# Patient Record
Sex: Male | Born: 1950 | Race: White | Hispanic: No | Marital: Married | Smoking: Former smoker
Health system: Southern US, Community
[De-identification: ages and names within clinical notes are randomized; demographics above are authoritative.]

## PROBLEM LIST (undated history)

## (undated) DIAGNOSIS — K7460 Unspecified cirrhosis of liver: Secondary | ICD-10-CM

## (undated) DIAGNOSIS — I10 Essential (primary) hypertension: Secondary | ICD-10-CM

## (undated) DIAGNOSIS — J449 Chronic obstructive pulmonary disease, unspecified: Secondary | ICD-10-CM

## (undated) DIAGNOSIS — D696 Thrombocytopenia, unspecified: Secondary | ICD-10-CM

## (undated) DIAGNOSIS — E039 Hypothyroidism, unspecified: Secondary | ICD-10-CM

## (undated) DIAGNOSIS — Z66 Do not resuscitate: Secondary | ICD-10-CM

## (undated) DIAGNOSIS — R161 Splenomegaly, not elsewhere classified: Secondary | ICD-10-CM

## (undated) DIAGNOSIS — K766 Portal hypertension: Secondary | ICD-10-CM

## (undated) DIAGNOSIS — Z21 Asymptomatic human immunodeficiency virus [HIV] infection status: Secondary | ICD-10-CM

## (undated) DIAGNOSIS — E7800 Pure hypercholesterolemia, unspecified: Secondary | ICD-10-CM

## (undated) HISTORY — PX: LEG SURGERY: SHX1003

## (undated) HISTORY — DX: Asymptomatic human immunodeficiency virus (hiv) infection status: Z21

## (undated) HISTORY — PX: BACK SURGERY: SHX140

## (undated) HISTORY — PX: TOE AMPUTATION: SHX809

## (undated) HISTORY — PX: THYROID SURGERY: SHX805

## (undated) HISTORY — PX: ROTATOR CUFF REPAIR: SHX139

## (undated) SURGERY — Surgical Case
Anesthesia: *Unknown

## (undated) SURGERY — EGD (ESOPHAGOGASTRODUODENOSCOPY)
Anesthesia: Monitor Anesthesia Care | Laterality: Left

---

## 2001-08-26 ENCOUNTER — Ambulatory Visit (HOSPITAL_COMMUNITY): Admission: RE | Admit: 2001-08-26 | Discharge: 2001-08-26 | Payer: Self-pay | Admitting: Internal Medicine

## 2002-11-12 ENCOUNTER — Inpatient Hospital Stay (HOSPITAL_COMMUNITY): Admission: EM | Admit: 2002-11-12 | Discharge: 2002-11-13 | Payer: Self-pay | Admitting: Emergency Medicine

## 2002-11-12 ENCOUNTER — Encounter: Payer: Self-pay | Admitting: Emergency Medicine

## 2002-11-21 ENCOUNTER — Ambulatory Visit (HOSPITAL_COMMUNITY): Admission: RE | Admit: 2002-11-21 | Discharge: 2002-11-21 | Payer: Self-pay | Admitting: Unknown Physician Specialty

## 2002-11-21 ENCOUNTER — Encounter: Payer: Self-pay | Admitting: Unknown Physician Specialty

## 2002-11-23 ENCOUNTER — Other Ambulatory Visit: Admission: RE | Admit: 2002-11-23 | Discharge: 2002-11-23 | Payer: Self-pay | Admitting: Unknown Physician Specialty

## 2004-12-11 ENCOUNTER — Ambulatory Visit: Payer: Self-pay | Admitting: Cardiology

## 2004-12-19 ENCOUNTER — Ambulatory Visit: Payer: Self-pay | Admitting: *Deleted

## 2004-12-19 ENCOUNTER — Inpatient Hospital Stay (HOSPITAL_BASED_OUTPATIENT_CLINIC_OR_DEPARTMENT_OTHER): Admission: RE | Admit: 2004-12-19 | Discharge: 2004-12-19 | Payer: Self-pay | Admitting: *Deleted

## 2010-09-23 ENCOUNTER — Encounter
Admission: RE | Admit: 2010-09-23 | Discharge: 2010-09-23 | Payer: Self-pay | Source: Home / Self Care | Attending: Sports Medicine | Admitting: Sports Medicine

## 2011-01-12 ENCOUNTER — Inpatient Hospital Stay (HOSPITAL_COMMUNITY)
Admission: EM | Admit: 2011-01-12 | Discharge: 2011-01-22 | DRG: 464 | Disposition: A | Discharge status: Home Health | Payer: Medicare Other | Attending: Orthopedic Surgery | Admitting: Orthopedic Surgery

## 2011-01-12 ENCOUNTER — Emergency Department (HOSPITAL_COMMUNITY): Payer: Medicare Other

## 2011-01-12 DIAGNOSIS — K219 Gastro-esophageal reflux disease without esophagitis: Secondary | ICD-10-CM | POA: Diagnosis present

## 2011-01-12 DIAGNOSIS — M545 Low back pain, unspecified: Secondary | ICD-10-CM | POA: Diagnosis present

## 2011-01-12 DIAGNOSIS — G5730 Lesion of lateral popliteal nerve, unspecified lower limb: Secondary | ICD-10-CM | POA: Diagnosis present

## 2011-01-12 DIAGNOSIS — Z79899 Other long term (current) drug therapy: Secondary | ICD-10-CM

## 2011-01-12 DIAGNOSIS — I10 Essential (primary) hypertension: Secondary | ICD-10-CM | POA: Diagnosis present

## 2011-01-12 DIAGNOSIS — Z87891 Personal history of nicotine dependence: Secondary | ICD-10-CM

## 2011-01-12 DIAGNOSIS — G8929 Other chronic pain: Secondary | ICD-10-CM | POA: Diagnosis present

## 2011-01-12 DIAGNOSIS — E119 Type 2 diabetes mellitus without complications: Secondary | ICD-10-CM | POA: Diagnosis present

## 2011-01-12 DIAGNOSIS — E7800 Pure hypercholesterolemia, unspecified: Secondary | ICD-10-CM | POA: Diagnosis present

## 2011-01-12 DIAGNOSIS — K5900 Constipation, unspecified: Secondary | ICD-10-CM | POA: Diagnosis not present

## 2011-01-12 DIAGNOSIS — D62 Acute posthemorrhagic anemia: Secondary | ICD-10-CM | POA: Diagnosis not present

## 2011-01-12 DIAGNOSIS — Z794 Long term (current) use of insulin: Secondary | ICD-10-CM

## 2011-01-12 DIAGNOSIS — E669 Obesity, unspecified: Secondary | ICD-10-CM | POA: Diagnosis present

## 2011-01-12 DIAGNOSIS — Y92410 Unspecified street and highway as the place of occurrence of the external cause: Secondary | ICD-10-CM

## 2011-01-12 DIAGNOSIS — S82109B Unspecified fracture of upper end of unspecified tibia, initial encounter for open fracture type I or II: Principal | ICD-10-CM | POA: Diagnosis present

## 2011-01-12 DIAGNOSIS — E890 Postprocedural hypothyroidism: Secondary | ICD-10-CM | POA: Diagnosis present

## 2011-01-12 DIAGNOSIS — K279 Peptic ulcer, site unspecified, unspecified as acute or chronic, without hemorrhage or perforation: Secondary | ICD-10-CM | POA: Diagnosis present

## 2011-01-12 DIAGNOSIS — F411 Generalized anxiety disorder: Secondary | ICD-10-CM | POA: Diagnosis present

## 2011-01-12 DIAGNOSIS — I2510 Atherosclerotic heart disease of native coronary artery without angina pectoris: Secondary | ICD-10-CM | POA: Diagnosis present

## 2011-01-12 DIAGNOSIS — D696 Thrombocytopenia, unspecified: Secondary | ICD-10-CM | POA: Diagnosis not present

## 2011-01-12 LAB — CBC
MCH: 31.8 pg (ref 26.0–34.0)
MCV: 87.5 fL (ref 78.0–100.0)
Platelets: 148 10*3/uL — ABNORMAL LOW (ref 150–400)
RBC: 4.47 MIL/uL (ref 4.22–5.81)
WBC: 7.6 10*3/uL (ref 4.0–10.5)

## 2011-01-12 LAB — COMPREHENSIVE METABOLIC PANEL
Alkaline Phosphatase: 111 U/L (ref 39–117)
Calcium: 9.8 mg/dL (ref 8.4–10.5)
Creatinine, Ser: 1.14 mg/dL (ref 0.4–1.5)
GFR calc Af Amer: >60 mL/min (ref >60)
GFR calc non Af Amer: >60 mL/min (ref >60)
Potassium: 4.3 mEq/L (ref 3.5–5.1)
Total Bilirubin: 1 mg/dL (ref 0.3–1.2)
Total Protein: 7 g/dL (ref 6.0–8.3)

## 2011-01-12 LAB — PROTIME-INR
INR: 1.03 (ref 0.00–1.49)
Prothrombin Time: 13.7 seconds (ref 11.6–15.2)

## 2011-01-12 MED ORDER — IOHEXOL 300 MG/ML  SOLN
90.0000 mL | Freq: Once | INTRAMUSCULAR | Status: AC | PRN
  Administered 2011-01-12: 90 mL via INTRAVENOUS

## 2011-01-13 ENCOUNTER — Inpatient Hospital Stay (HOSPITAL_COMMUNITY): Payer: Medicare Other

## 2011-01-13 LAB — BASIC METABOLIC PANEL
BUN: 14 mg/dL (ref 6–23)
Calcium: 8.6 mg/dL (ref 8.4–10.5)
Chloride: 100 mEq/L (ref 96–112)
GFR calc non Af Amer: >60 mL/min (ref >60)
Glucose, Bld: 344 mg/dL — ABNORMAL HIGH (ref 70–99)
Potassium: 4.4 mEq/L (ref 3.5–5.1)
Sodium: 134 mEq/L — ABNORMAL LOW (ref 135–145)

## 2011-01-13 LAB — HEMOGLOBIN A1C
Hgb A1c MFr Bld: 11.6 % — ABNORMAL HIGH (ref <5.7)
Mean Plasma Glucose: 286 mg/dL — ABNORMAL HIGH (ref <117)

## 2011-01-13 LAB — CBC
Hemoglobin: 10.5 g/dL — ABNORMAL LOW (ref 13.0–17.0)
MCV: 88.3 fL (ref 78.0–100.0)
RDW: 12.6 % (ref 11.5–15.5)
WBC: 14.4 10*3/uL — ABNORMAL HIGH (ref 4.0–10.5)

## 2011-01-13 LAB — GLUCOSE, CAPILLARY
Glucose-Capillary: 385 mg/dL — ABNORMAL HIGH (ref 70–99)
Glucose-Capillary: 292 mg/dL — ABNORMAL HIGH (ref 70–99)
Glucose-Capillary: 369 mg/dL — ABNORMAL HIGH (ref 70–99)

## 2011-01-14 ENCOUNTER — Inpatient Hospital Stay (HOSPITAL_COMMUNITY): Payer: Medicare Other

## 2011-01-14 LAB — COMPREHENSIVE METABOLIC PANEL
ALT: 28 U/L (ref 0–53)
AST: 48 U/L — ABNORMAL HIGH (ref 0–37)
Alkaline Phosphatase: 77 U/L (ref 39–117)
CO2: 29 mEq/L (ref 19–32)
Chloride: 100 mEq/L (ref 96–112)
GFR calc Af Amer: >60 mL/min (ref >60)
GFR calc non Af Amer: >60 mL/min (ref >60)
Sodium: 136 mEq/L (ref 135–145)
Total Bilirubin: 0.7 mg/dL (ref 0.3–1.2)

## 2011-01-14 LAB — TSH: TSH: 2.607 u[IU]/mL (ref 0.350–4.500)

## 2011-01-14 LAB — CBC
Hemoglobin: 8.8 g/dL — ABNORMAL LOW (ref 13.0–17.0)
RBC: 2.77 MIL/uL — ABNORMAL LOW (ref 4.22–5.81)

## 2011-01-14 LAB — GLUCOSE, CAPILLARY
Glucose-Capillary: 253 mg/dL — ABNORMAL HIGH (ref 70–99)
Glucose-Capillary: 317 mg/dL — ABNORMAL HIGH (ref 70–99)

## 2011-01-14 LAB — LACTIC ACID, PLASMA: Lactic Acid, Venous: 2 mmol/L (ref 0.5–2.2)

## 2011-01-15 DIAGNOSIS — S82209A Unspecified fracture of shaft of unspecified tibia, initial encounter for closed fracture: Secondary | ICD-10-CM

## 2011-01-15 DIAGNOSIS — X58XXXA Exposure to other specified factors, initial encounter: Secondary | ICD-10-CM

## 2011-01-15 DIAGNOSIS — S82409A Unspecified fracture of shaft of unspecified fibula, initial encounter for closed fracture: Secondary | ICD-10-CM

## 2011-01-15 LAB — GLUCOSE, CAPILLARY
Glucose-Capillary: 173 mg/dL — ABNORMAL HIGH (ref 70–99)
Glucose-Capillary: 143 mg/dL — ABNORMAL HIGH (ref 70–99)

## 2011-01-15 LAB — CBC
MCV: 87 fL (ref 78.0–100.0)
Platelets: 93 10*3/uL — ABNORMAL LOW (ref 150–400)
RBC: 2.54 MIL/uL — ABNORMAL LOW (ref 4.22–5.81)
WBC: 5.7 10*3/uL (ref 4.0–10.5)

## 2011-01-15 LAB — BASIC METABOLIC PANEL
BUN: 16 mg/dL (ref 6–23)
Chloride: 101 mEq/L (ref 96–112)
Potassium: 3.8 mEq/L (ref 3.5–5.1)

## 2011-01-16 LAB — BASIC METABOLIC PANEL
CO2: 29 mEq/L (ref 19–32)
Chloride: 100 mEq/L (ref 96–112)
GFR calc Af Amer: >60 mL/min (ref >60)
Potassium: 4.3 mEq/L (ref 3.5–5.1)
Sodium: 132 mEq/L — ABNORMAL LOW (ref 135–145)

## 2011-01-16 LAB — GLUCOSE, CAPILLARY
Glucose-Capillary: 230 mg/dL — ABNORMAL HIGH (ref 70–99)
Glucose-Capillary: 150 mg/dL — ABNORMAL HIGH (ref 70–99)
Glucose-Capillary: 160 mg/dL — ABNORMAL HIGH (ref 70–99)
Glucose-Capillary: 216 mg/dL — ABNORMAL HIGH (ref 70–99)

## 2011-01-16 LAB — CBC
Hemoglobin: 8.4 g/dL — ABNORMAL LOW (ref 13.0–17.0)
RBC: 2.65 MIL/uL — ABNORMAL LOW (ref 4.22–5.81)
WBC: 6.5 10*3/uL (ref 4.0–10.5)

## 2011-01-17 LAB — GLUCOSE, CAPILLARY
Glucose-Capillary: 108 mg/dL — ABNORMAL HIGH (ref 70–99)
Glucose-Capillary: 205 mg/dL — ABNORMAL HIGH (ref 70–99)
Glucose-Capillary: 155 mg/dL — ABNORMAL HIGH (ref 70–99)
Glucose-Capillary: 115 mg/dL — ABNORMAL HIGH (ref 70–99)

## 2011-01-17 LAB — CBC
Hemoglobin: 8.9 g/dL — ABNORMAL LOW (ref 13.0–17.0)
MCH: 30.5 pg (ref 26.0–34.0)
Platelets: 159 10*3/uL (ref 150–400)
RBC: 2.92 MIL/uL — ABNORMAL LOW (ref 4.22–5.81)
WBC: 7.3 10*3/uL (ref 4.0–10.5)

## 2011-01-17 LAB — BASIC METABOLIC PANEL
CO2: 28 mEq/L (ref 19–32)
Chloride: 99 mEq/L (ref 96–112)
Creatinine, Ser: 1.02 mg/dL (ref 0.4–1.5)
GFR calc Af Amer: >60 mL/min (ref >60)
Sodium: 134 mEq/L — ABNORMAL LOW (ref 135–145)

## 2011-01-18 LAB — GLUCOSE, CAPILLARY
Glucose-Capillary: 147 mg/dL — ABNORMAL HIGH (ref 70–99)
Glucose-Capillary: 92 mg/dL (ref 70–99)

## 2011-01-18 LAB — CBC
HCT: 25.2 % — ABNORMAL LOW (ref 39.0–52.0)
MCHC: 34.9 g/dL (ref 30.0–36.0)
MCV: 90.6 fL (ref 78.0–100.0)
Platelets: 158 10*3/uL (ref 150–400)
RDW: 13.5 % (ref 11.5–15.5)
WBC: 6.9 10*3/uL (ref 4.0–10.5)

## 2011-01-19 LAB — TYPE AND SCREEN: ABO/RH(D): A NEG

## 2011-01-19 LAB — GLUCOSE, CAPILLARY
Glucose-Capillary: 155 mg/dL — ABNORMAL HIGH (ref 70–99)
Glucose-Capillary: 109 mg/dL — ABNORMAL HIGH (ref 70–99)
Glucose-Capillary: 116 mg/dL — ABNORMAL HIGH (ref 70–99)
Glucose-Capillary: 123 mg/dL — ABNORMAL HIGH (ref 70–99)

## 2011-01-20 LAB — GLUCOSE, CAPILLARY
Glucose-Capillary: 190 mg/dL — ABNORMAL HIGH (ref 70–99)
Glucose-Capillary: 148 mg/dL — ABNORMAL HIGH (ref 70–99)
Glucose-Capillary: 200 mg/dL — ABNORMAL HIGH (ref 70–99)
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
## 2011-01-21 LAB — GLUCOSE, CAPILLARY
Glucose-Capillary: 122 mg/dL — ABNORMAL HIGH (ref 70–99)
Glucose-Capillary: 162 mg/dL — ABNORMAL HIGH (ref 70–99)
Glucose-Capillary: 157 mg/dL — ABNORMAL HIGH (ref 70–99)

## 2011-01-22 LAB — BASIC METABOLIC PANEL
BUN: 14 mg/dL (ref 6–23)
Creatinine, Ser: 1.06 mg/dL (ref 0.4–1.5)
GFR calc non Af Amer: >60 mL/min (ref >60)
Glucose, Bld: 113 mg/dL — ABNORMAL HIGH (ref 70–99)

## 2011-01-22 LAB — CBC
HCT: 29.4 % — ABNORMAL LOW (ref 39.0–52.0)
MCH: 30.8 pg (ref 26.0–34.0)
MCHC: 34.4 g/dL (ref 30.0–36.0)
MCV: 89.6 fL (ref 78.0–100.0)
Platelets: 213 10*3/uL (ref 150–400)
RDW: 13.5 % (ref 11.5–15.5)

## 2011-01-22 LAB — GLUCOSE, CAPILLARY
Glucose-Capillary: 119 mg/dL — ABNORMAL HIGH (ref 70–99)
Glucose-Capillary: 127 mg/dL — ABNORMAL HIGH (ref 70–99)
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
## 2011-02-13 NOTE — Cardiovascular Report (Signed)
NAMEROURKE, MCQUITTY                  ACCOUNT NO.:  [id number]   MEDICAL RECORD NO.:  [id number]          PATIENT TYPE:  OIB   LOCATION:  6501                         FACILITY:  [facility]   PHYSICIAN:  Carole Binning, M.D. LHCDATE OF BIRTH:  05-16-1951   DATE OF PROCEDURE:  12/19/2004  DATE OF DISCHARGE:                              CARDIAC CATHETERIZATION   PROCEDURE PERFORMED:  Left heart catheterization with coronary angiography  and left ventriculography.   INDICATIONS FOR PROCEDURE:  The patient is a 60 year old male with history  of progressive exertional chest pain.  He was referred by Dr. Andee Lineman for  cardiac catheterization to rule out coronary disease.   PROCEDURE NOTE:  A 4 French sheath was placed in the right femoral artery.  Coronary arteriography was performed with 4 [place] JL4 and JR4 catheters.  Left ventriculography was performed angled pigtail catheter.  Contrast was  Omnipaque.  There no complications.   HEMODYNAMIC RESULTS:  Left ventricular pressure 140/16; aortic pressure  133/88.  There is no aortic valve gradient on catheter pullback.   Left ventriculogram with wall motion normal.  Ejection fraction estimated 55-  60%.  There is no mitral regurgitation.   Coronary arteriography:  1.  Left  main is normal.  2.  Left anterior descending artery has minor luminal irregularities in the      mid to distal vessel.  LAD is otherwise normal giving rise to single      small diagonal branch.  3.  Left circumflex gives rise to normal-sized ramus intermedius, normal-      sized bifurcating first obtuse marginal branch and a normal-sized      bifurcating second obtuse marginal branch.  There is a 30% stenosis in      the ostium of the ramus intermedius.  The left circumflex is otherwise      normal.  4.  Right coronary is a dominant vessel.  It gives rise to a small posterior      descending artery and three small posterolateral branches. The right      coronary is  normal.   IMPRESSION:  1.  Normal left ventricular systolic function.  2.  No significant coronary artery disease identified.   RECOMMENDATIONS:  For medical therapy.      MWP/MEDQ  D:  12/19/2004  T:  12/19/2004  Job:  [id number]   cc:   Prudy Feeler, P.A.  [place], [place]    [facility]  [place], [place]   Cardiac Catheterization Lab
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
## 2011-02-13 NOTE — Procedures (Signed)
   [name] [name], [name] [name]                            ACCOUNT NO.:  [id number]   MEDICAL RECORD NO.:  [id number]                   PATIENT TYPE:  INP   LOCATION:  A307                                 FACILITY:  [facility]   PHYSICIAN:  Edward L. Juanetta Gosling, M.D.             DATE OF BIRTH:  1950/12/30   DATE OF PROCEDURE:  11/12/2002  DATE OF DISCHARGE:  11/13/2002                                EKG INTERPRETATION   PROCEDURE:  EKG.   INTERPRETATION:  The rhythm is sinus rhythm with a rate in the 70s.  Since  the previous tracing small Q-waves have appeared inferior and there is T-  wave inversion inferiorly.  This could indicate myocardial damage and  clinical correlation is suggested.  Abnormal electrocardiogram.                                               Oneal Deputy. Juanetta Gosling, M.D.    ELH/MEDQ  D:  11/20/2002  T:  11/20/2002  Job:  [id number]

## 2011-02-13 NOTE — Procedures (Signed)
   [name] [name], [name] [name]                            ACCOUNT NO.:  [id number]   MEDICAL RECORD NO.:  [id number]                   PATIENT TYPE:  INP   LOCATION:  A307                                 FACILITY:  [facility]   PHYSICIAN:  Edward L. Juanetta Gosling, M.D.             DATE OF BIRTH:  1951-09-01   DATE OF PROCEDURE:  11/12/2002  DATE OF DISCHARGE:  11/13/2002                                EKG INTERPRETATION   PROCEDURE:  EKG.   INTERPRETATION:  The rhythm is sinus rhythm with a rate in the 70s.  Otherwise, normal electrocardiogram.                                               Edward L. Juanetta Gosling, M.D.    ELH/MEDQ  D:  11/20/2002  T:  11/20/2002  Job:  [id number]
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
## 2011-03-12 ENCOUNTER — Ambulatory Visit (HOSPITAL_COMMUNITY)
Admission: RE | Admit: 2011-03-12 | Discharge: 2011-03-12 | Disposition: A | Discharge status: Home / Self Care | Payer: Medicare Other | Source: Ambulatory Visit | Attending: Orthopedic Surgery | Admitting: Orthopedic Surgery

## 2011-03-12 ENCOUNTER — Ambulatory Visit (HOSPITAL_COMMUNITY): Payer: Medicare Other

## 2011-03-12 DIAGNOSIS — Z01812 Encounter for preprocedural laboratory examination: Secondary | ICD-10-CM | POA: Insufficient documentation

## 2011-03-12 DIAGNOSIS — Z4789 Encounter for other orthopedic aftercare: Secondary | ICD-10-CM | POA: Insufficient documentation

## 2011-03-12 DIAGNOSIS — Z01818 Encounter for other preprocedural examination: Secondary | ICD-10-CM | POA: Insufficient documentation

## 2011-03-12 DIAGNOSIS — IMO0002 Reserved for concepts with insufficient information to code with codable children: Secondary | ICD-10-CM | POA: Insufficient documentation

## 2011-03-12 DIAGNOSIS — Z79899 Other long term (current) drug therapy: Secondary | ICD-10-CM | POA: Insufficient documentation

## 2011-03-12 LAB — BASIC METABOLIC PANEL
BUN: 19 mg/dL (ref 6–23)
Chloride: 104 mEq/L (ref 96–112)
Creatinine, Ser: 0.77 mg/dL (ref 0.4–1.5)
Glucose, Bld: 115 mg/dL — ABNORMAL HIGH (ref 70–99)
Potassium: 3.7 mEq/L (ref 3.5–5.1)

## 2011-03-12 LAB — DIFFERENTIAL
Basophils Relative: 0 % (ref 0–1)
Eosinophils Absolute: 0.2 10*3/uL (ref 0.0–0.7)
Lymphs Abs: 2.3 10*3/uL (ref 0.7–4.0)
Monocytes Absolute: 0.7 10*3/uL (ref 0.1–1.0)
Monocytes Relative: 11 % (ref 3–12)
Neutro Abs: 3.7 10*3/uL (ref 1.7–7.7)
Neutrophils Relative %: 54 % (ref 43–77)

## 2011-03-12 LAB — C-REACTIVE PROTEIN: CRP: 0.6 mg/dL — ABNORMAL HIGH (ref <0.6)

## 2011-03-12 LAB — CBC
Hemoglobin: 12.6 g/dL — ABNORMAL LOW (ref 13.0–17.0)
MCH: 29.9 pg (ref 26.0–34.0)
MCHC: 33.9 g/dL (ref 30.0–36.0)
MCV: 88.4 fL (ref 78.0–100.0)
RBC: 4.21 MIL/uL — ABNORMAL LOW (ref 4.22–5.81)

## 2011-03-12 LAB — SURGICAL PCR SCREEN
MRSA, PCR: NEGATIVE
Staphylococcus aureus: NEGATIVE

## 2011-03-12 LAB — GLUCOSE, CAPILLARY

## 2011-03-12 LAB — HEMOGLOBIN A1C
Hgb A1c MFr Bld: 6.6 % — ABNORMAL HIGH (ref <5.7)
Mean Plasma Glucose: 143 mg/dL — ABNORMAL HIGH (ref <117)

## 2011-03-12 LAB — PROTIME-INR: Prothrombin Time: 14.4 seconds (ref 11.6–15.2)

## 2011-04-16 NOTE — Op Note (Signed)
[name] [name], [name] [name]                  ACCOUNT NO.:  [id number]  MEDICAL RECORD NO.:  [id number]  LOCATION:  [facility]                         FACILITY:  [facility]  PHYSICIAN:  Doralee Albino. Carola Frost, M.D. DATE OF BIRTH:  September 30, 1950  DATE OF PROCEDURE:  03/12/2011 DATE OF DISCHARGE:  03/12/2011                              OPERATIVE REPORT   PREOPERATIVE DIAGNOSIS:  Left tibia-fibula shaft and plateau fracture, status post external fixation.  POSTOPERATIVE DIAGNOSIS:  Left tibia-fibula nonunion.  PROCEDURES: 1. Removal of external fixator under anesthesia. 2. Debridement of left femur and left tibia pin-track sites. 3. Manipulation of the left ankle. 4. Manipulation of the left knee. 5. Stress fluoro of the tibia.  SURGEON:  Doralee Albino. Carola Frost, MD  ASSISTANT:  Mearl Latin, PA  ANESTHESIA:  General.  COMPLICATIONS:  None.  FINDINGS: 1. Unstable tibial shaft fracture to varus with stability to valgus at     the metaphysis. 2. Ankle motion increased from 30 degrees flexion contracture to     neutral. 3. Knee motion increased from 0-10 degrees to 0-100 degrees.  DISPOSITION:  To PACU.  CONDITION:  Stable.  BRIEF SUMMARY AND INDICATIONS FOR PROCEDURE:  [name] [name] is a very pleasant 60 year old male status post grade IIIB open tibia plateau and shaft fractures who because of his severe diabetes, smoking and soft tissue condition, required prolonged use of an external fixator as definitive management.  He now presents 8 weeks out for removal of his fixator with examination to see if healing is complete as well as manipulation of the knee and ankle.  We discussed preoperative risks and benefits of surgery including loss of reduction with progressive deformity, need for further surgery, arthritis, DVT, PE, heart attack, stroke, and multiple others.  The patient did wish to proceed.  BRIEF DESCRIPTION OF PROCEDURE:  The patient was administered preop antibiotics, taken to the operating room  where general anesthesia was induced.  His external fixator was removed including the pins.  There was some purulent drainage around the distal femoral pin site.  All the pins sites were debrided thoroughly with chlorhexidine scrub brush and then curettes for the soft tissues and the deep bone going from the cleanest to the most soiled.  The remaining eschar over his metaphyseal fractures was then debrided sharply.  The majority of that lifted cleanly without sharp debridement; however, sharp debridement was required over 1 cm to 15 mm where there were some granulation tissue underneath.  There was no expressible purulence, however.  There was no other significant surrounding erythema consistent with infection either. There was some remaining pigmentation from the skin contusions related to the initial trauma.  We then examined the left ankle finding a flexion contracture of greater than 30 degrees.  Fortunately, it was mobile and with consistent application of pressure and control of the distal tibia, I was able to bring his ankle up into few degrees of extension.  The left tibia was then evaluated under fluoro stressing it in varus and valgus directions.  It was stable to varus stress and did not appear to move, however, with varus.  It appeared hinged laterally with the ability to telescope on  the medial cortex consistent with nonunion.  I was able to gently begin manipulation of the knee with no increase in motion.  Also of note, there was no gross motion to a visual inspection other than when looking under fluoro.  Consequently, we proceeded with manipulation of the knee taking the knee from 0-20 degrees up to 0-200 degrees.  The patient was then placed into a long hinged brace with application of a valgus mold to the bars of the brace near the fracture and this was applied after application of sterile gently compressive dressing.  The patient was then awakened from anesthesia and  transported to the PACU in stable condition.  Montez Morita assisted me throughout the procedure to expedite the case with simultaneous removal of the pins in so forth.  PROGNOSIS:  [name] [name] will begin touchdown weightbearing in his hinged brace.  We will plan to see him back in 7-10 days and at that time, we will arrange for a fracture brace with a valgus mold to increase his ambulatory function and I am hopeful his weightbearing that it will likely be partial weightbearing for the next 8 weeks, we will continue with the bone stimulator and he may ultimately require revision of surgery.     Doralee Albino. Carola Frost, M.D.     MHH/MEDQ  D:  03/12/2011  T:  03/13/2011  Job:  [id number]  Electronically Signed by Myrene Galas M.D. on 04/16/2011 06:29:59 PM

## 2011-05-11 ENCOUNTER — Ambulatory Visit: Payer: Medicare Other | Attending: Orthopedic Surgery | Admitting: Physical Therapy

## 2011-05-11 DIAGNOSIS — R5381 Other malaise: Secondary | ICD-10-CM | POA: Insufficient documentation

## 2011-05-11 DIAGNOSIS — IMO0001 Reserved for inherently not codable concepts without codable children: Secondary | ICD-10-CM | POA: Insufficient documentation

## 2011-05-11 DIAGNOSIS — R262 Difficulty in walking, not elsewhere classified: Secondary | ICD-10-CM | POA: Insufficient documentation

## 2011-05-11 DIAGNOSIS — M25669 Stiffness of unspecified knee, not elsewhere classified: Secondary | ICD-10-CM | POA: Insufficient documentation

## 2011-05-11 DIAGNOSIS — M25569 Pain in unspecified knee: Secondary | ICD-10-CM | POA: Insufficient documentation

## 2011-05-14 ENCOUNTER — Ambulatory Visit: Payer: Medicare Other | Admitting: Physical Therapy

## 2011-05-19 ENCOUNTER — Ambulatory Visit: Payer: Medicare Other | Admitting: Physical Therapy

## 2011-05-21 ENCOUNTER — Ambulatory Visit: Payer: Medicare Other | Admitting: *Deleted

## 2011-05-25 ENCOUNTER — Encounter: Payer: Medicare Other | Admitting: Physical Therapy

## 2011-05-28 ENCOUNTER — Ambulatory Visit: Payer: Medicare Other | Admitting: Physical Therapy

## 2011-06-02 ENCOUNTER — Encounter: Payer: Medicare Other | Admitting: Physical Therapy

## 2011-06-04 ENCOUNTER — Ambulatory Visit: Payer: Medicare Other | Attending: Orthopedic Surgery | Admitting: Physical Therapy

## 2011-06-04 DIAGNOSIS — M25669 Stiffness of unspecified knee, not elsewhere classified: Secondary | ICD-10-CM | POA: Insufficient documentation

## 2011-06-04 DIAGNOSIS — M25569 Pain in unspecified knee: Secondary | ICD-10-CM | POA: Insufficient documentation

## 2011-06-04 DIAGNOSIS — R5381 Other malaise: Secondary | ICD-10-CM | POA: Insufficient documentation

## 2011-06-04 DIAGNOSIS — R262 Difficulty in walking, not elsewhere classified: Secondary | ICD-10-CM | POA: Insufficient documentation

## 2011-06-04 DIAGNOSIS — IMO0001 Reserved for inherently not codable concepts without codable children: Secondary | ICD-10-CM | POA: Insufficient documentation

## 2011-06-09 ENCOUNTER — Ambulatory Visit: Payer: Medicare Other | Admitting: Physical Therapy

## 2011-06-11 ENCOUNTER — Ambulatory Visit: Payer: Medicare Other | Admitting: Physical Therapy

## 2011-06-16 ENCOUNTER — Encounter: Payer: Medicare Other | Admitting: Physical Therapy

## 2011-06-18 ENCOUNTER — Ambulatory Visit: Payer: Medicare Other | Admitting: Physical Therapy

## 2011-06-23 ENCOUNTER — Ambulatory Visit: Payer: Medicare Other | Admitting: Physical Therapy

## 2011-06-25 ENCOUNTER — Ambulatory Visit: Payer: Medicare Other | Admitting: Physical Therapy

## 2011-06-30 ENCOUNTER — Encounter: Payer: Medicare Other | Admitting: *Deleted

## 2011-07-02 ENCOUNTER — Encounter: Payer: Medicare Other | Admitting: *Deleted

## 2011-07-07 ENCOUNTER — Ambulatory Visit: Payer: Medicare Other | Attending: Orthopedic Surgery | Admitting: *Deleted

## 2011-07-07 DIAGNOSIS — R5381 Other malaise: Secondary | ICD-10-CM | POA: Insufficient documentation

## 2011-07-07 DIAGNOSIS — R262 Difficulty in walking, not elsewhere classified: Secondary | ICD-10-CM | POA: Insufficient documentation

## 2011-07-07 DIAGNOSIS — M25669 Stiffness of unspecified knee, not elsewhere classified: Secondary | ICD-10-CM | POA: Insufficient documentation

## 2011-07-07 DIAGNOSIS — IMO0001 Reserved for inherently not codable concepts without codable children: Secondary | ICD-10-CM | POA: Insufficient documentation

## 2011-07-07 DIAGNOSIS — M25569 Pain in unspecified knee: Secondary | ICD-10-CM | POA: Insufficient documentation

## 2011-07-09 ENCOUNTER — Encounter: Payer: Medicare Other | Admitting: *Deleted

## 2011-07-13 ENCOUNTER — Encounter: Payer: Medicare Other | Admitting: Physical Therapy

## 2011-07-16 ENCOUNTER — Ambulatory Visit: Payer: Medicare Other | Admitting: Physical Therapy

## 2011-07-21 ENCOUNTER — Ambulatory Visit: Payer: Medicare Other | Admitting: Physical Therapy

## 2011-07-23 ENCOUNTER — Ambulatory Visit: Payer: Medicare Other | Admitting: Physical Therapy

## 2011-07-28 ENCOUNTER — Encounter: Payer: Medicare Other | Admitting: Physical Therapy

## 2011-07-30 ENCOUNTER — Ambulatory Visit: Payer: Medicare Other | Attending: Orthopedic Surgery | Admitting: Physical Therapy

## 2011-07-30 DIAGNOSIS — R262 Difficulty in walking, not elsewhere classified: Secondary | ICD-10-CM | POA: Insufficient documentation

## 2011-07-30 DIAGNOSIS — M25669 Stiffness of unspecified knee, not elsewhere classified: Secondary | ICD-10-CM | POA: Insufficient documentation

## 2011-07-30 DIAGNOSIS — M25569 Pain in unspecified knee: Secondary | ICD-10-CM | POA: Insufficient documentation

## 2011-07-30 DIAGNOSIS — IMO0001 Reserved for inherently not codable concepts without codable children: Secondary | ICD-10-CM | POA: Insufficient documentation

## 2011-07-30 DIAGNOSIS — R5381 Other malaise: Secondary | ICD-10-CM | POA: Insufficient documentation

## 2011-08-04 ENCOUNTER — Ambulatory Visit: Payer: Medicare Other | Admitting: *Deleted

## 2011-08-06 ENCOUNTER — Ambulatory Visit: Payer: Medicare Other | Admitting: *Deleted

## 2011-08-11 ENCOUNTER — Encounter: Payer: Medicare Other | Admitting: *Deleted

## 2011-08-13 ENCOUNTER — Ambulatory Visit: Payer: Medicare Other | Admitting: *Deleted

## 2011-08-18 ENCOUNTER — Ambulatory Visit: Payer: Medicare Other | Admitting: Physical Therapy

## 2011-08-25 ENCOUNTER — Ambulatory Visit: Payer: Medicare Other | Admitting: *Deleted

## 2011-08-27 ENCOUNTER — Encounter: Payer: Medicare Other | Admitting: Physical Therapy

## 2011-09-03 ENCOUNTER — Ambulatory Visit: Payer: Medicare Other | Attending: Orthopedic Surgery | Admitting: Physical Therapy

## 2011-09-03 DIAGNOSIS — M25569 Pain in unspecified knee: Secondary | ICD-10-CM | POA: Insufficient documentation

## 2011-09-03 DIAGNOSIS — IMO0001 Reserved for inherently not codable concepts without codable children: Secondary | ICD-10-CM | POA: Insufficient documentation

## 2011-09-03 DIAGNOSIS — R5381 Other malaise: Secondary | ICD-10-CM | POA: Insufficient documentation

## 2011-09-03 DIAGNOSIS — R262 Difficulty in walking, not elsewhere classified: Secondary | ICD-10-CM | POA: Insufficient documentation

## 2011-09-03 DIAGNOSIS — M25669 Stiffness of unspecified knee, not elsewhere classified: Secondary | ICD-10-CM | POA: Insufficient documentation

## 2011-09-08 ENCOUNTER — Encounter: Payer: Medicare Other | Admitting: *Deleted

## 2011-09-10 ENCOUNTER — Encounter: Payer: Medicare Other | Admitting: *Deleted

## 2011-09-15 ENCOUNTER — Encounter: Payer: Medicare Other | Admitting: *Deleted

## 2011-09-17 ENCOUNTER — Encounter: Payer: Medicare Other | Admitting: *Deleted

## 2011-09-24 ENCOUNTER — Encounter: Payer: Medicare Other | Admitting: Physical Therapy

## 2011-09-29 DIAGNOSIS — Z21 Asymptomatic human immunodeficiency virus [HIV] infection status: Secondary | ICD-10-CM

## 2011-09-29 HISTORY — DX: Asymptomatic human immunodeficiency virus (hiv) infection status: Z21

## 2011-10-01 ENCOUNTER — Ambulatory Visit: Payer: Medicare Other | Attending: Orthopedic Surgery | Admitting: *Deleted

## 2011-10-01 DIAGNOSIS — R5381 Other malaise: Secondary | ICD-10-CM | POA: Insufficient documentation

## 2011-10-01 DIAGNOSIS — R262 Difficulty in walking, not elsewhere classified: Secondary | ICD-10-CM | POA: Insufficient documentation

## 2011-10-01 DIAGNOSIS — M25569 Pain in unspecified knee: Secondary | ICD-10-CM | POA: Insufficient documentation

## 2011-10-01 DIAGNOSIS — IMO0001 Reserved for inherently not codable concepts without codable children: Secondary | ICD-10-CM | POA: Insufficient documentation

## 2011-10-01 DIAGNOSIS — M25669 Stiffness of unspecified knee, not elsewhere classified: Secondary | ICD-10-CM | POA: Insufficient documentation

## 2011-10-06 ENCOUNTER — Ambulatory Visit: Payer: Medicare Other | Admitting: *Deleted

## 2011-10-08 ENCOUNTER — Ambulatory Visit: Payer: Medicare Other | Admitting: *Deleted

## 2011-10-13 ENCOUNTER — Ambulatory Visit: Payer: Medicare Other | Admitting: *Deleted

## 2011-10-15 ENCOUNTER — Ambulatory Visit: Payer: Medicare Other | Admitting: *Deleted

## 2011-10-20 ENCOUNTER — Ambulatory Visit: Payer: Medicare Other | Admitting: *Deleted

## 2011-10-22 ENCOUNTER — Ambulatory Visit: Payer: Medicare Other | Admitting: *Deleted

## 2011-10-27 ENCOUNTER — Ambulatory Visit: Payer: Medicare Other | Admitting: *Deleted

## 2011-10-29 ENCOUNTER — Ambulatory Visit: Payer: Medicare Other | Admitting: *Deleted

## 2011-11-03 ENCOUNTER — Encounter: Payer: Medicare Other | Admitting: *Deleted

## 2011-11-05 ENCOUNTER — Encounter: Payer: Medicare Other | Admitting: *Deleted

## 2012-05-15 ENCOUNTER — Encounter (HOSPITAL_COMMUNITY): Payer: Self-pay | Admitting: *Deleted

## 2012-05-15 ENCOUNTER — Inpatient Hospital Stay (HOSPITAL_COMMUNITY)
Admission: EM | Admit: 2012-05-15 | Discharge: 2012-05-20 | DRG: 639 | Disposition: A | Discharge status: Home / Self Care | Payer: Medicare Other | Attending: Internal Medicine | Admitting: Internal Medicine

## 2012-05-15 ENCOUNTER — Emergency Department (HOSPITAL_COMMUNITY): Payer: Medicare Other

## 2012-05-15 DIAGNOSIS — Z794 Long term (current) use of insulin: Secondary | ICD-10-CM

## 2012-05-15 DIAGNOSIS — R162 Hepatomegaly with splenomegaly, not elsewhere classified: Secondary | ICD-10-CM | POA: Diagnosis present

## 2012-05-15 DIAGNOSIS — Z21 Asymptomatic human immunodeficiency virus [HIV] infection status: Secondary | ICD-10-CM | POA: Clinically undetermined

## 2012-05-15 DIAGNOSIS — L089 Local infection of the skin and subcutaneous tissue, unspecified: Secondary | ICD-10-CM

## 2012-05-15 DIAGNOSIS — L02619 Cutaneous abscess of unspecified foot: Secondary | ICD-10-CM | POA: Diagnosis present

## 2012-05-15 DIAGNOSIS — D6959 Other secondary thrombocytopenia: Secondary | ICD-10-CM | POA: Diagnosis present

## 2012-05-15 DIAGNOSIS — Z87891 Personal history of nicotine dependence: Secondary | ICD-10-CM

## 2012-05-15 DIAGNOSIS — E039 Hypothyroidism, unspecified: Secondary | ICD-10-CM | POA: Diagnosis present

## 2012-05-15 DIAGNOSIS — K279 Peptic ulcer, site unspecified, unspecified as acute or chronic, without hemorrhage or perforation: Secondary | ICD-10-CM | POA: Diagnosis present

## 2012-05-15 DIAGNOSIS — L03031 Cellulitis of right toe: Secondary | ICD-10-CM

## 2012-05-15 DIAGNOSIS — IMO0002 Reserved for concepts with insufficient information to code with codable children: Principal | ICD-10-CM | POA: Diagnosis present

## 2012-05-15 DIAGNOSIS — K7689 Other specified diseases of liver: Secondary | ICD-10-CM | POA: Diagnosis present

## 2012-05-15 DIAGNOSIS — E1165 Type 2 diabetes mellitus with hyperglycemia: Secondary | ICD-10-CM | POA: Diagnosis present

## 2012-05-15 DIAGNOSIS — E1169 Type 2 diabetes mellitus with other specified complication: Principal | ICD-10-CM | POA: Diagnosis present

## 2012-05-15 DIAGNOSIS — S98139A Complete traumatic amputation of one unspecified lesser toe, initial encounter: Secondary | ICD-10-CM

## 2012-05-15 DIAGNOSIS — E1149 Type 2 diabetes mellitus with other diabetic neurological complication: Secondary | ICD-10-CM | POA: Diagnosis present

## 2012-05-15 DIAGNOSIS — D696 Thrombocytopenia, unspecified: Secondary | ICD-10-CM | POA: Diagnosis present

## 2012-05-15 DIAGNOSIS — L03039 Cellulitis of unspecified toe: Secondary | ICD-10-CM | POA: Diagnosis present

## 2012-05-15 DIAGNOSIS — E7800 Pure hypercholesterolemia, unspecified: Secondary | ICD-10-CM | POA: Diagnosis present

## 2012-05-15 DIAGNOSIS — F411 Generalized anxiety disorder: Secondary | ICD-10-CM | POA: Diagnosis present

## 2012-05-15 DIAGNOSIS — Z79899 Other long term (current) drug therapy: Secondary | ICD-10-CM

## 2012-05-15 DIAGNOSIS — R739 Hyperglycemia, unspecified: Secondary | ICD-10-CM

## 2012-05-15 DIAGNOSIS — M86171 Other acute osteomyelitis, right ankle and foot: Secondary | ICD-10-CM

## 2012-05-15 DIAGNOSIS — I2510 Atherosclerotic heart disease of native coronary artery without angina pectoris: Secondary | ICD-10-CM | POA: Diagnosis present

## 2012-05-15 DIAGNOSIS — G5730 Lesion of lateral popliteal nerve, unspecified lower limb: Secondary | ICD-10-CM | POA: Diagnosis present

## 2012-05-15 DIAGNOSIS — E1142 Type 2 diabetes mellitus with diabetic polyneuropathy: Secondary | ICD-10-CM | POA: Diagnosis present

## 2012-05-15 DIAGNOSIS — I10 Essential (primary) hypertension: Secondary | ICD-10-CM | POA: Diagnosis present

## 2012-05-15 HISTORY — DX: Pure hypercholesterolemia, unspecified: E78.00

## 2012-05-15 HISTORY — DX: Essential (primary) hypertension: I10

## 2012-05-15 LAB — CBC WITH DIFFERENTIAL/PLATELET
Basophils Relative: 1 % (ref 0–1)
Eosinophils Absolute: 0.1 10*3/uL (ref 0.0–0.7)
HCT: 37.6 % — ABNORMAL LOW (ref 39.0–52.0)
Hemoglobin: 13.3 g/dL (ref 13.0–17.0)
MCH: 31.7 pg (ref 26.0–34.0)
MCHC: 35.4 g/dL (ref 30.0–36.0)
Monocytes Absolute: 0.7 10*3/uL (ref 0.1–1.0)
Monocytes Relative: 8 % (ref 3–12)
Neutro Abs: 5.8 10*3/uL (ref 1.7–7.7)

## 2012-05-15 LAB — URINALYSIS, ROUTINE W REFLEX MICROSCOPIC
Bilirubin Urine: NEGATIVE
Hgb urine dipstick: NEGATIVE
Ketones, ur: NEGATIVE mg/dL
Nitrite: NEGATIVE
pH: 5.5 (ref 5.0–8.0)

## 2012-05-15 LAB — BASIC METABOLIC PANEL
BUN: 13 mg/dL (ref 6–23)
Chloride: 103 mEq/L (ref 96–112)
Creatinine, Ser: 0.85 mg/dL (ref 0.50–1.35)
GFR calc Af Amer: >90 mL/min (ref >90)
Glucose, Bld: 226 mg/dL — ABNORMAL HIGH (ref 70–99)

## 2012-05-15 LAB — GLUCOSE, CAPILLARY

## 2012-05-15 MED ORDER — SODIUM CHLORIDE 0.9 % IV SOLN: 250.0000 mL | INTRAVENOUS | Status: DC | PRN

## 2012-05-15 MED ORDER — ZOLPIDEM TARTRATE 5 MG PO TABS: 5.0000 mg | ORAL_TABLET | Freq: Every evening | ORAL | Status: DC | PRN

## 2012-05-15 MED ORDER — VANCOMYCIN HCL IN DEXTROSE 1-5 GM/200ML-% IV SOLN
1000.0000 mg | Freq: Once | INTRAVENOUS | Status: AC
  Administered 2012-05-15: 1000 mg via INTRAVENOUS
  Filled 2012-05-15: qty 200

## 2012-05-15 MED ORDER — INSULIN GLARGINE 100 UNIT/ML ~~LOC~~ SOLN
25.0000 [IU] | Freq: Every day | SUBCUTANEOUS | Status: DC
  Administered 2012-05-15 – 2012-05-19 (×5): 25 [IU] via SUBCUTANEOUS

## 2012-05-15 MED ORDER — SIMVASTATIN 10 MG PO TABS
10.0000 mg | ORAL_TABLET | Freq: Every day | ORAL | Status: DC
  Administered 2012-05-15 – 2012-05-19 (×5): 10 mg via ORAL
  Filled 2012-05-15 (×5): qty 1

## 2012-05-15 MED ORDER — ONDANSETRON HCL 4 MG/2ML IJ SOLN: 4.0000 mg | Freq: Four times a day (QID) | INTRAMUSCULAR | Status: DC | PRN

## 2012-05-15 MED ORDER — SODIUM CHLORIDE 0.9 % IJ SOLN
3.0000 mL | Freq: Two times a day (BID) | INTRAMUSCULAR | Status: DC
  Administered 2012-05-15 – 2012-05-19 (×6): 3 mL via INTRAVENOUS
  Filled 2012-05-15 (×3): qty 3

## 2012-05-15 MED ORDER — PIPERACILLIN-TAZOBACTAM 3.375 G IVPB
3.3750 g | Freq: Three times a day (TID) | INTRAVENOUS | Status: DC
  Administered 2012-05-15 – 2012-05-20 (×15): 3.375 g via INTRAVENOUS
  Filled 2012-05-15 (×24): qty 50

## 2012-05-15 MED ORDER — MORPHINE SULFATE 2 MG/ML IJ SOLN
2.0000 mg | INTRAMUSCULAR | Status: DC | PRN
  Administered 2012-05-16 – 2012-05-19 (×2): 2 mg via INTRAVENOUS
  Filled 2012-05-15 (×2): qty 1

## 2012-05-15 MED ORDER — HYDROCODONE-ACETAMINOPHEN 5-325 MG PO TABS
1.0000 | ORAL_TABLET | ORAL | Status: DC | PRN
  Administered 2012-05-18: 2 via ORAL
  Administered 2012-05-20: 1 via ORAL
  Filled 2012-05-15: qty 1
  Filled 2012-05-15: qty 2

## 2012-05-15 MED ORDER — ALPRAZOLAM 0.5 MG PO TABS: 0.5000 mg | ORAL_TABLET | Freq: Three times a day (TID) | ORAL | Status: DC | PRN

## 2012-05-15 MED ORDER — POLYETHYLENE GLYCOL 3350 17 G PO PACK: 17.0000 g | PACK | Freq: Every day | ORAL | Status: DC | PRN

## 2012-05-15 MED ORDER — SODIUM CHLORIDE 0.9 % IV BOLUS (SEPSIS)
1000.0000 mL | Freq: Once | INTRAVENOUS | Status: AC
  Administered 2012-05-15: 1000 mL via INTRAVENOUS

## 2012-05-15 MED ORDER — LEVOTHYROXINE SODIUM 50 MCG PO TABS
50.0000 ug | ORAL_TABLET | Freq: Every day | ORAL | Status: DC
  Administered 2012-05-15 – 2012-05-20 (×6): 50 ug via ORAL
  Filled 2012-05-15 (×6): qty 1

## 2012-05-15 MED ORDER — ONDANSETRON HCL 4 MG PO TABS: 4.0000 mg | ORAL_TABLET | Freq: Four times a day (QID) | ORAL | Status: DC | PRN

## 2012-05-15 MED ORDER — ACETAMINOPHEN 325 MG PO TABS: 650.0000 mg | ORAL_TABLET | Freq: Four times a day (QID) | ORAL | Status: DC | PRN

## 2012-05-15 MED ORDER — PANTOPRAZOLE SODIUM 40 MG PO TBEC
40.0000 mg | DELAYED_RELEASE_TABLET | Freq: Every day | ORAL | Status: DC
  Administered 2012-05-16 – 2012-05-20 (×5): 40 mg via ORAL
  Filled 2012-05-15 (×6): qty 1

## 2012-05-15 MED ORDER — VANCOMYCIN HCL IN DEXTROSE 1-5 GM/200ML-% IV SOLN
1000.0000 mg | Freq: Two times a day (BID) | INTRAVENOUS | Status: DC
  Administered 2012-05-16 – 2012-05-20 (×10): 1000 mg via INTRAVENOUS
  Filled 2012-05-15 (×16): qty 200

## 2012-05-15 MED ORDER — SODIUM CHLORIDE 0.9 % IJ SOLN
3.0000 mL | INTRAMUSCULAR | Status: DC | PRN
  Filled 2012-05-15 (×3): qty 3

## 2012-05-15 MED ORDER — SODIUM CHLORIDE 0.9 % IV SOLN
INTRAVENOUS | Status: DC
  Administered 2012-05-15: 18:00:00 via INTRAVENOUS

## 2012-05-15 MED ORDER — ALUM & MAG HYDROXIDE-SIMETH 200-200-20 MG/5ML PO SUSP: 30.0000 mL | Freq: Four times a day (QID) | ORAL | Status: DC | PRN

## 2012-05-15 MED ORDER — INSULIN ASPART 100 UNIT/ML ~~LOC~~ SOLN
9.0000 [IU] | Freq: Three times a day (TID) | SUBCUTANEOUS | Status: DC
  Administered 2012-05-16 – 2012-05-20 (×8): 9 [IU] via SUBCUTANEOUS

## 2012-05-15 MED ORDER — VANCOMYCIN HCL IN DEXTROSE 1-5 GM/200ML-% IV SOLN
INTRAVENOUS | Status: AC
  Filled 2012-05-15: qty 200

## 2012-05-15 MED ORDER — ACETAMINOPHEN 650 MG RE SUPP: 650.0000 mg | Freq: Four times a day (QID) | RECTAL | Status: DC | PRN

## 2012-05-15 MED ORDER — DOCUSATE SODIUM 100 MG PO CAPS
100.0000 mg | ORAL_CAPSULE | Freq: Two times a day (BID) | ORAL | Status: DC
  Administered 2012-05-15 – 2012-05-19 (×8): 100 mg via ORAL
  Filled 2012-05-15 (×10): qty 1

## 2012-05-15 MED ORDER — SODIUM CHLORIDE 0.9 % IV SOLN
500.0000 mg | Freq: Once | INTRAVENOUS | Status: AC
  Administered 2012-05-15: 500 mg via INTRAVENOUS
  Filled 2012-05-15: qty 500

## 2012-05-15 MED ORDER — PIPERACILLIN-TAZOBACTAM 3.375 G IVPB
INTRAVENOUS | Status: AC
  Filled 2012-05-15: qty 100

## 2012-05-15 MED ORDER — SODIUM CHLORIDE 0.9 % IV SOLN
INTRAVENOUS | Status: AC
  Administered 2012-05-15: 1000 mL via INTRAVENOUS

## 2012-05-15 MED ORDER — SODIUM CHLORIDE 0.9 % IV SOLN
INTRAVENOUS | Status: DC
  Administered 2012-05-16 – 2012-05-18 (×2): via INTRAVENOUS
  Administered 2012-05-19: 1000 mL via INTRAVENOUS
  Administered 2012-05-19: 04:00:00 via INTRAVENOUS

## 2012-05-15 NOTE — ED Notes (Signed)
Report given to [place], Charity fundraiser.  Pt to be admitted to room 320

## 2012-05-15 NOTE — Consult Note (Signed)
ANTIBIOTIC CONSULT NOTE - INITIAL  Pharmacy Consult for Vancomycin and Zosyn Indication: cellulitis, diabetic toe infxn  No Known Allergies  Patient Measurements: Height: 5\' 9"  (175.3 cm) Weight: 200 lb (90.719 kg) IBW/kg (Calculated) : 70.7   Vital Signs: Temp: 98.1 F (36.7 C) (08/18 1854) Temp src: Oral (08/18 1356) BP: 153/86 mmHg (08/18 1854) Pulse Rate: 71  (08/18 1854) Intake/Output from previous day:   Intake/Output from this shift:    Labs:  Basename 05/15/12 1430  WBC 8.7  HGB 13.3  PLT 89*  LABCREA --  CREATININE 0.85   Estimated Creatinine Clearance: 99 ml/min (by C-G formula based on Cr of 0.85). No results found for this basename: VANCOTROUGH:2,VANCOPEAK:2,VANCORANDOM:2,GENTTROUGH:2,GENTPEAK:2,GENTRANDOM:2,TOBRATROUGH:2,TOBRAPEAK:2,TOBRARND:2,AMIKACINPEAK:2,AMIKACINTROU:2,AMIKACIN:2, in the last 72 hours   Microbiology: No results found for this or any previous visit (from the past 720 hour(s)).  Medical History: Past Medical History  Diagnosis Date  . Diabetes mellitus   . Thyroid disease   . High cholesterol   . Hypertension    Medications:  Scheduled:    . sodium chloride   Intravenous STAT  . docusate sodium  100 mg Oral BID  . imipenem-cilastatin (PRIMAXIN) 500 MG IV (MINIBAG PLUS)  500 mg Intravenous Once  . insulin aspart  9 Units Subcutaneous TID AC  . insulin glargine  25 Units Subcutaneous QHS  . levothyroxine  50 mcg Oral Daily  . pantoprazole  40 mg Oral Q1200  . piperacillin-tazobactam (ZOSYN)  IV  3.375 g Intravenous Q8H  . simvastatin  10 mg Oral QHS  . sodium chloride  1,000 mL Intravenous Once  . sodium chloride  3 mL Intravenous Q12H  . vancomycin  1,000 mg Intravenous Once  . vancomycin  1,000 mg Intravenous Q12H   Assessment: 61yo male with toe infection.  Good renal fxn.  Goal of Therapy:  Vancomycin trough level 10-15 mcg/ml Eradicate infection.  Plan: Zosyn 3.375gm iv q8hrs Vancomycin 1gm iv q12hrs Check  trough at steady state Monitor labs, renal fxn, and cultures per protocol  Valrie Hart A 05/15/2012,8:23 PM
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
## 2012-05-15 NOTE — ED Notes (Signed)
Attempted to give report to department.  RN unavailable at present time.  Left message for return call.
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
## 2012-05-15 NOTE — ED Notes (Signed)
Pt c/o pain, swelling and drainage to his right great toe. Pt states that he pulled his toenail off Wednesday night with pliers because it was only half on. Pt states that it has gotten more swollen and red each day. States that it was draining yellowish white pus last night.

## 2012-05-16 DIAGNOSIS — L089 Local infection of the skin and subcutaneous tissue, unspecified: Secondary | ICD-10-CM

## 2012-05-16 DIAGNOSIS — E1169 Type 2 diabetes mellitus with other specified complication: Secondary | ICD-10-CM

## 2012-05-16 DIAGNOSIS — D696 Thrombocytopenia, unspecified: Secondary | ICD-10-CM

## 2012-05-16 DIAGNOSIS — E039 Hypothyroidism, unspecified: Secondary | ICD-10-CM

## 2012-05-16 DIAGNOSIS — IMO0001 Reserved for inherently not codable concepts without codable children: Secondary | ICD-10-CM

## 2012-05-16 LAB — BASIC METABOLIC PANEL
BUN: 10 mg/dL (ref 6–23)
Calcium: 9 mg/dL (ref 8.4–10.5)
Creatinine, Ser: 0.86 mg/dL (ref 0.50–1.35)
GFR calc non Af Amer: >90 mL/min (ref >90)
Glucose, Bld: 195 mg/dL — ABNORMAL HIGH (ref 70–99)
Potassium: 3.9 mEq/L (ref 3.5–5.1)

## 2012-05-16 LAB — CBC
Hemoglobin: 12.9 g/dL — ABNORMAL LOW (ref 13.0–17.0)
MCH: 31.6 pg (ref 26.0–34.0)
MCHC: 35.3 g/dL (ref 30.0–36.0)
RDW: 12.6 % (ref 11.5–15.5)

## 2012-05-16 LAB — HEMOGLOBIN A1C: Hgb A1c MFr Bld: 11.1 % — ABNORMAL HIGH (ref <5.7)

## 2012-05-16 NOTE — Clinical Social Work Note (Signed)
CSW received consult with not comment or reason attached. Noted that referral was made to [facility] for medication needs. CSW reviewed History and Physical. At this time, it appears plan is for pt to return home when stable and no substance abuse documented. CSW will sign off but can be reconsulted if needs arise.   Derenda Fennel, [place] [phone]

## 2012-05-16 NOTE — Consult Note (Signed)
ANTIBIOTIC CONSULT NOTE   Pharmacy Consult for Vancomycin and Zosyn Indication: cellulitis, diabetic toe infxn  No Known Allergies  Patient Measurements: Height: 5\' 9"  (175.3 cm) Weight: 200 lb (90.719 kg) IBW/kg (Calculated) : 70.7   Vital Signs: Temp: 97.9 F (36.6 C) (08/19 0511) Temp src: Oral (08/19 0511) BP: 152/82 mmHg (08/19 0511) Pulse Rate: 66  (08/19 0511) Intake/Output from previous day:   Intake/Output from this shift:    Labs:  Basename 05/16/12 0501 05/15/12 1430  WBC 5.6 8.7  HGB 12.9* 13.3  PLT 86* 89*  LABCREA -- --  CREATININE 0.86 0.85   Estimated Creatinine Clearance: 97.9 ml/min (by C-G formula based on Cr of 0.86). No results found for this basename: VANCOTROUGH:2,VANCOPEAK:2,VANCORANDOM:2,GENTTROUGH:2,GENTPEAK:2,GENTRANDOM:2,TOBRATROUGH:2,TOBRAPEAK:2,TOBRARND:2,AMIKACINPEAK:2,AMIKACINTROU:2,AMIKACIN:2, in the last 72 hours   Microbiology: No results found for this or any previous visit (from the past 720 hour(s)).  Medical History: Past Medical History  Diagnosis Date  . Diabetes mellitus   . Thyroid disease   . High cholesterol   . Hypertension    Medications:  Scheduled:     . sodium chloride   Intravenous STAT  . docusate sodium  100 mg Oral BID  . imipenem-cilastatin (PRIMAXIN) 500 MG IV (MINIBAG PLUS)  500 mg Intravenous Once  . insulin aspart  9 Units Subcutaneous TID AC  . insulin glargine  25 Units Subcutaneous QHS  . levothyroxine  50 mcg Oral Daily  . pantoprazole  40 mg Oral Q1200  . piperacillin-tazobactam (ZOSYN)  IV  3.375 g Intravenous Q8H  . simvastatin  10 mg Oral QHS  . sodium chloride  1,000 mL Intravenous Once  . sodium chloride  3 mL Intravenous Q12H  . vancomycin  1,000 mg Intravenous Once  . vancomycin  1,000 mg Intravenous Q12H   Assessment: 61yo male with toe infection.  Good renal fxn. Thrombocytopenia noted.  Goal of Therapy:  Vancomycin trough level 10-15 mcg/ml Eradicate  infection.  Plan: Zosyn 3.375gm iv q8hrs Vancomycin 1gm iv q12hrs Check trough tomorrow am Monitor labs, renal fxn, and cultures per protocol  Margo Aye, Geovannie Vilar A 05/16/2012,10:00 AM
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
## 2012-05-16 NOTE — Progress Notes (Signed)
UR Chart Review Completed  

## 2012-05-16 NOTE — Progress Notes (Signed)
[facility] PROGRESS NOTE  [name] [name] BJY:[id number] DOB: [birth year] DOA: 05/15/2012 PCP: [name], PA  Assessment/Plan: Principal Problem:  Cellulitis of great toe of right foot Pain has improved. Continue Zosyn and vancomycin. Orthopedic consult pending.  Active Problems:  Thrombocytopenia Unsure of baseline platelet. Platelet is stable. Will monitor.   Diabetes mellitus type 2, uncontrolled Continue Lantus and sliding scale insulin. Aim for better control of blood sugars.   Hypothyroidism Synthroid   Brief narrative: Patient is a 61 year old white male admitted with cellulitis of the great toe. Orthopedics has been consulted.  Consultants:  Orthopedic 8/19  Procedures:  None  Antibiotics:  Zosyn 8/19  Vancomycin 8/19 ?  HPI/Subjective: Patient feels fine except for some pain in the left lower extremity mostly in the evenings. This is chronic. The pain in the right great toe has improved  Objective: Filed Vitals:   05/15/12 1822 05/15/12 1854 05/15/12 2108 05/16/12 0511  BP: 150/71 153/86 162/81 152/82  Pulse: 64 71 65 66  Temp: 98.8 F (37.1 C) 98.1 F (36.7 C) 98.1 F (36.7 C) 97.9 F (36.6 C)  TempSrc:   Oral Oral  Resp: 16 18 20 20   Height:      Weight:      SpO2: 99% 99% 97% 98%    Intake/Output Summary (Last 24 hours) at 05/16/12 1129 Last data filed at 05/16/12 0800  Gross per 24 hour  Intake      0 ml  Output      0 ml  Net      0 ml    Exam:   General:  Patient laying in bed he doesn't seem to be in any acute distress.  Cardiovascular: Regular rate rhythm  Respiratory: Clear to auscultations bilaterally  ABDOMEN soft nontender nondistended positive bowel sounds  EXT: Great toe with swelling and erythema.  Data Reviewed: Basic Metabolic Panel:  Lab 05/16/12 0865 05/15/12 1430  NA 140 136  K 3.9 3.5  CL 107 103  CO2 25 26  GLUCOSE 195* 226*  BUN 10 13  CREATININE 0.86 0.85  CALCIUM 9.0 9.6  MG -- --  PHOS  -- --     Lab 05/16/12 0501 05/15/12 1430  WBC 5.6 8.7  NEUTROABS -- 5.8  HGB 12.9* 13.3  HCT 36.5* 37.6*  MCV 89.5 89.7  PLT 86* 89*   CBG:  Lab 05/16/12 0730 05/15/12 2105 05/15/12 1401  GLUCAP 130* 147* 246*     Studies: Dg Toe Great Right  05/15/2012  *RADIOLOGY REPORT*  Clinical Data: 61 year old male with recent injury.  Toe swelling and redness.  History of prior surgery.  RIGHT GREAT TOE  Comparison: None.  Findings: Extensive soft tissue swelling of the left first toe is evident, especially medially.  No subcutaneous gas identified. Arthrodesis hardware traversing the right first phalanges appears intact but there is some surrounding lucency.  No comparison. Right first MTP joint appears within normal limits.  No definite osteolysis.  IMPRESSION: Soft tissue swelling with no definite acute osseous abnormality. There is lucency ( presumably chronic) along the course of the first toe arthrodesis hardware.  Original Report Authenticated By: Ulla Potash III, M.D.    Scheduled Meds:   . sodium chloride   Intravenous STAT  . docusate sodium  100 mg Oral BID  . imipenem-cilastatin (PRIMAXIN) 500 MG IV (MINIBAG PLUS)  500 mg Intravenous Once  . insulin aspart  9 Units Subcutaneous TID AC  . insulin glargine  25 Units Subcutaneous QHS  .  levothyroxine  50 mcg Oral Daily  . pantoprazole  40 mg Oral Q1200  . piperacillin-tazobactam (ZOSYN)  IV  3.375 g Intravenous Q8H  . simvastatin  10 mg Oral QHS  . sodium chloride  1,000 mL Intravenous Once  . sodium chloride  3 mL Intravenous Q12H  . vancomycin  1,000 mg Intravenous Once  . vancomycin  1,000 mg Intravenous Q12H   Continuous Infusions:   . sodium chloride 75 mL/hr at 05/16/12 1058  . DISCONTD: sodium chloride 125 mL/hr at 05/15/12 1737    Code Status: Full code Family Communication: None  Disposition Plan: Home when medically stable  [name] Posey, MD  [facility] Pager [phone]  If 8PM-8AM, please contact  night-coverage [email or web address] Password [email or web address] 05/16/2012, 11:29 AM   LOS: 1 day     Time Spent:25 min

## 2012-05-16 NOTE — Care Management Note (Unsigned)
    Page 1 of 1   05/19/2012     10:47:05 AM   CARE MANAGEMENT NOTE 05/19/2012  Patient:  [name] [name], [name] [name]   Account Number:  [id number]  Date Initiated:  05/16/2012  Documentation initiated by:  Rosemary Holms  Subjective/Objective Assessment:   Pt admitted with R. toe cellulitis. Lives at home with his wife and two grown granddaughters.     Action/Plan:   Plan to DC home. Awaiting hospital course to assess [facility] needs   Anticipated DC Date:     Anticipated DC Plan:        DC Planning Services  CM consult      Choice offered to / List presented to:  C-3 Spouse           HH agency  [facility]   Status of service:  In process, will continue to follow Medicare Important Message given?   (If response is "NO", the following Medicare IM given date fields will be blank) Date Medicare IM given:   Date Additional Medicare IM given:    Discharge Disposition:    Per UR Regulation:    If discussed at Long Length of Stay Meetings, dates discussed:    Comments:  05/19/12 1015 Cheris Tweten RN BSN CM Spoke with wife at bedside while pt was in [place]. Depending on surgical outcome, wife is sure she can assist in home care. If wound care or IV antibiotics are needed, she feels she can be trained and would welcome HH RN to assist and oversee. [facility].  05/16/12 1400 Daron Breeding Leanord Hawking RN BSN CM

## 2012-05-17 ENCOUNTER — Encounter (HOSPITAL_COMMUNITY): Payer: Self-pay | Admitting: Orthopedic Surgery

## 2012-05-17 DIAGNOSIS — L03039 Cellulitis of unspecified toe: Secondary | ICD-10-CM

## 2012-05-17 LAB — BASIC METABOLIC PANEL WITH GFR
BUN: 9 mg/dL (ref 6–23)
CO2: 25 meq/L (ref 19–32)
Calcium: 9.3 mg/dL (ref 8.4–10.5)
Chloride: 104 meq/L (ref 96–112)
Creatinine, Ser: 0.99 mg/dL (ref 0.50–1.35)
GFR calc Af Amer: >90 mL/min
GFR calc non Af Amer: 85 mL/min — ABNORMAL LOW
Glucose, Bld: 234 mg/dL — ABNORMAL HIGH (ref 70–99)
Potassium: 4.5 meq/L (ref 3.5–5.1)
Sodium: 137 meq/L (ref 135–145)

## 2012-05-17 LAB — CBC
HCT: 37.4 % — ABNORMAL LOW (ref 39.0–52.0)
Hemoglobin: 13.2 g/dL (ref 13.0–17.0)
MCH: 31.5 pg (ref 26.0–34.0)
MCHC: 35.3 g/dL (ref 30.0–36.0)
MCV: 89.3 fL (ref 78.0–100.0)
Platelets: 109 K/uL — ABNORMAL LOW (ref 150–400)
RBC: 4.19 MIL/uL — ABNORMAL LOW (ref 4.22–5.81)
RDW: 12.6 % (ref 11.5–15.5)
WBC: 6.1 K/uL (ref 4.0–10.5)

## 2012-05-17 LAB — URINE CULTURE
Colony Count: NO GROWTH
Culture: NO GROWTH

## 2012-05-17 LAB — GLUCOSE, CAPILLARY
Glucose-Capillary: 123 mg/dL — ABNORMAL HIGH (ref 70–99)
Glucose-Capillary: 216 mg/dL — ABNORMAL HIGH (ref 70–99)
Glucose-Capillary: 189 mg/dL — ABNORMAL HIGH (ref 70–99)

## 2012-05-17 LAB — VANCOMYCIN, RANDOM: Vancomycin Rm: 10.3 ug/mL
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
## 2012-05-17 NOTE — Progress Notes (Signed)
[facility] PROGRESS NOTE  Avyukth Bontempo ZOX:[id number] DOB: 1951-01-17 DOA: 05/15/2012 PCP: [name], PA  Assessment/Plan: Principal Problem:  Cellulitis of great toe of right foot Pain has improved. Continue Zosyn and vancomycin. Orthopedic consult noted. Podiatry consulted.  Active Problems:  Thrombocytopenia Unsure of baseline platelet. Per orthopedic hematology consulted   Diabetes mellitus type 2, uncontrolled Continue Lantus and sliding scale insulin. Aim for better control of blood sugars.   Hypothyroidism Synthroid   Brief narrative: Patient is a 61 year old white male admitted with cellulitis of the great toe.   Consultants:  Orthopedic 8/19  Procedures:  None  Antibiotics:  Zosyn 8/19  Vancomycin 8/19 ?  HPI/Subjective: Patient feels fine except for the great toe.  Objective: Filed Vitals:   05/15/12 1822 05/15/12 1854 05/15/12 2108 05/16/12 0511  BP: 150/71 153/86 162/81 152/82  Pulse: 64 71 65 66  Temp: 98.8 F (37.1 C) 98.1 F (36.7 C) 98.1 F (36.7 C) 97.9 F (36.6 C)  TempSrc:   Oral Oral  Resp: 16 18 20 20   Height:      Weight:      SpO2: 99% 99% 97% 98%    Intake/Output Summary (Last 24 hours) at 05/16/12 1129 Last data filed at 05/16/12 0800  Gross per 24 hour  Intake      0 ml  Output      0 ml  Net      0 ml    Exam:   General:  Patient laying in bed he doesn't seem to be in any acute distress.  Cardiovascular: Regular rate rhythm  Respiratory: Clear to auscultations bilaterally  ABDOMEN soft nontender nondistended positive bowel sounds  EXT: Great toe with swelling and erythema.  Data Reviewed: Basic Metabolic Panel:  Lab 05/16/12 8119 05/15/12 1430  NA 140 136  K 3.9 3.5  CL 107 103  CO2 25 26  GLUCOSE 195* 226*  BUN 10 13  CREATININE 0.86 0.85  CALCIUM 9.0 9.6  MG -- --  PHOS -- --     Lab 05/16/12 0501 05/15/12 1430  WBC 5.6 8.7  NEUTROABS -- 5.8  HGB 12.9* 13.3  HCT 36.5* 37.6*  MCV  89.5 89.7  PLT 86* 89*   CBG:  Lab 05/16/12 0730 05/15/12 2105 05/15/12 1401  GLUCAP 130* 147* 246*     Studies: Dg Toe Great Right  05/15/2012  *RADIOLOGY REPORT*  Clinical Data: 61 year old male with recent injury.  Toe swelling and redness.  History of prior surgery.  RIGHT GREAT TOE  Comparison: None.  Findings: Extensive soft tissue swelling of the left first toe is evident, especially medially.  No subcutaneous gas identified. Arthrodesis hardware traversing the right first phalanges appears intact but there is some surrounding lucency.  No comparison. Right first MTP joint appears within normal limits.  No definite osteolysis.  IMPRESSION: Soft tissue swelling with no definite acute osseous abnormality. There is lucency ( presumably chronic) along the course of the first toe arthrodesis hardware.  Original Report Authenticated By: Ulla Potash III, M.D.    Scheduled Meds:   . sodium chloride   Intravenous STAT  . docusate sodium  100 mg Oral BID  . imipenem-cilastatin (PRIMAXIN) 500 MG IV (MINIBAG PLUS)  500 mg Intravenous Once  . insulin aspart  9 Units Subcutaneous TID AC  . insulin glargine  25 Units Subcutaneous QHS  . levothyroxine  50 mcg Oral Daily  . pantoprazole  40 mg Oral Q1200  . piperacillin-tazobactam (ZOSYN)  IV  3.375 g Intravenous Q8H  . simvastatin  10 mg Oral QHS  . sodium chloride  1,000 mL Intravenous Once  . sodium chloride  3 mL Intravenous Q12H  . vancomycin  1,000 mg Intravenous Once  . vancomycin  1,000 mg Intravenous Q12H   Continuous Infusions:   . sodium chloride 75 mL/hr at 05/16/12 1058  . DISCONTD: sodium chloride 125 mL/hr at 05/15/12 1737    Code Status: Full code Family Communication: None  Disposition Plan: Home when medically stable  [name], MD  [facility] Pager [phone]  If 8PM-8AM, please contact night-coverage [email or web address] Password [email or web address] 05/16/2012, 11:29 AM   LOS: 1 day     Time Spent:25  min

## 2012-05-17 NOTE — Consult Note (Signed)
ANTIBIOTIC CONSULT NOTE   Pharmacy Consult for Vancomycin and Zosyn Indication: cellulitis, diabetic toe infxn  No Known Allergies  Patient Measurements: Height: 5\' 9"  (175.3 cm) Weight: 200 lb (90.719 kg) IBW/kg (Calculated) : 70.7   Vital Signs: Temp: 98 F (36.7 C) (08/20 0408) Temp src: Oral (08/20 0408) BP: 150/82 mmHg (08/20 0408) Pulse Rate: 67  (08/20 0408) Intake/Output from previous day: 08/19 0701 - 08/20 0700 In: 240 [P.O.:240] Out: -  Intake/Output from this shift: Total I/O In: 580 [P.O.:580] Out: -   Labs:  Basename 05/17/12 0835 05/16/12 0501 05/15/12 1430  WBC 6.1 5.6 8.7  HGB 13.2 12.9* 13.3  PLT 109* 86* 89*  LABCREA -- -- --  CREATININE 0.99 0.86 0.85   Estimated Creatinine Clearance: 85 ml/min (by C-G formula based on Cr of 0.99).  Basename 05/17/12 0129  VANCOTROUGH --  Leodis Binet --  VANCORANDOM 10.3  GENTTROUGH --  GENTPEAK --  GENTRANDOM --  TOBRATROUGH --  TOBRAPEAK --  TOBRARND --  AMIKACINPEAK --  AMIKACINTROU --  AMIKACIN --     Microbiology: Recent Results (from the past 720 hour(s))  URINE CULTURE     Status: Normal   Collection Time   05/15/12  2:25 PM      Component Value Range Status Comment   Specimen Description URINE, CLEAN CATCH   Final    Special Requests NONE   Final    Culture  Setup Time 05/15/2012 21:27   Final    Colony Count NO GROWTH   Final    Culture NO GROWTH   Final    Report Status 05/17/2012 FINAL   Final     Medical History: Past Medical History  Diagnosis Date  . Diabetes mellitus   . Thyroid disease   . High cholesterol   . Hypertension    Medications:  Scheduled:     . docusate sodium  100 mg Oral BID  . insulin aspart  9 Units Subcutaneous TID AC  . insulin glargine  25 Units Subcutaneous QHS  . levothyroxine  50 mcg Oral Daily  . pantoprazole  40 mg Oral Q1200  . piperacillin-tazobactam (ZOSYN)  IV  3.375 g Intravenous Q8H  . simvastatin  10 mg Oral QHS  . sodium chloride  3  mL Intravenous Q12H  . vancomycin  1,000 mg Intravenous Q12H   Assessment: 61yo male on day#3 empiric antibiotics with Vancomycin and Zosyn for cellulitis of right toe.  No evidence of osteomyelitis.  Good renal fxn.  Thrombocytopenia noted. Vancomycin trough at goal today.   Goal of Therapy:  Vancomycin trough level 10-15 mcg/ml Eradicate infection.  Plan: Continue Zosyn 3.375gm iv q8hrs Continue Vancomycin 1gm iv q12hrs Weekly vancomycin trough and Scr while on Vancomycin  Amman Bartel, [facility] 05/17/2012,10:07 AM
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
## 2012-05-18 ENCOUNTER — Inpatient Hospital Stay (HOSPITAL_COMMUNITY): Payer: Medicare Other

## 2012-05-18 DIAGNOSIS — E119 Type 2 diabetes mellitus without complications: Secondary | ICD-10-CM

## 2012-05-18 DIAGNOSIS — I10 Essential (primary) hypertension: Secondary | ICD-10-CM

## 2012-05-18 LAB — HIV ANTIBODY (ROUTINE TESTING W REFLEX): HIV: REACTIVE — AB

## 2012-05-18 LAB — GLUCOSE, CAPILLARY
Glucose-Capillary: 100 mg/dL — ABNORMAL HIGH (ref 70–99)
Glucose-Capillary: 167 mg/dL — ABNORMAL HIGH (ref 70–99)

## 2012-05-18 LAB — CBC
MCHC: 35.4 g/dL (ref 30.0–36.0)
Platelets: 110 10*3/uL — ABNORMAL LOW (ref 150–400)
RDW: 12.7 % (ref 11.5–15.5)

## 2012-05-18 LAB — IRON AND TIBC
Iron: 78 ug/dL (ref 42–135)
Saturation Ratios: 25 % (ref 20–55)
TIBC: 308 ug/dL (ref 215–435)
UIBC: 230 ug/dL (ref 125–400)

## 2012-05-18 LAB — FERRITIN: Ferritin: 108 ng/mL (ref 22–322)

## 2012-05-18 NOTE — Progress Notes (Signed)
Podiatry Progress Note  Subjective: [name] [name] was seen today at bedside.  His wife is present.  He denies any nausea or vomiting fevers or chills.  He denies any changes.  He states that he was seen by hematology earlier today.  Objective: Vital signs in last 24 hours:   Temp:  [98 F (36.7 C)] 98 F (36.7 C) (08/21 0433) Pulse Rate:  [62-64] 64  (08/21 0433) Resp:  [20] 20  (08/21 0433) BP: (145-153)/(78-86) 148/78 mmHg (08/21 0433) SpO2:  [97 %-98 %] 98 % (08/21 0433)  The dressing was in place on his right foot.  The erythema of the right great toe present yesterday is improved.  Soft area of fluctuance is noted along the distal aspect of the great toe.  This is unchanged.  Pedal pulses are palpable.  Lab/Test Results:   Basename 05/18/12 0516 05/17/12 0835 05/16/12 0501  WBC 6.7 6.1 --  HGB 13.4 13.2 --  HCT 37.8* 37.4* --  PLT 110* 109* --  NA -- 137 140  K -- 4.5 3.9  CL -- 104 107  CO2 -- 25 25  BUN -- 9 10  CREATININE -- 0.99 0.86  GLUCOSE -- 234* 195*  CALCIUM -- 9.3 9.0    Recent Results (from the past 240 hour(s))  URINE CULTURE     Status: Normal   Collection Time   05/15/12  2:25 PM      Component Value Range Status Comment   Specimen Description URINE, CLEAN CATCH   Final    Special Requests NONE   Final    Culture  Setup Time 05/15/2012 21:27   Final    Colony Count NO GROWTH   Final    Culture NO GROWTH   Final    Report Status 05/17/2012 FINAL   Final      No results found.  Medications:  Scheduled Meds:   . docusate sodium  100 mg Oral BID  . insulin aspart  9 Units Subcutaneous TID AC  . insulin glargine  25 Units Subcutaneous QHS  . levothyroxine  50 mcg Oral Daily  . pantoprazole  40 mg Oral Q1200  . piperacillin-tazobactam (ZOSYN)  IV  3.375 g Intravenous Q8H  . simvastatin  10 mg Oral QHS  . sodium chloride  3 mL Intravenous Q12H  . vancomycin  1,000 mg Intravenous Q12H   Continuous Infusions:   . sodium chloride 75 mL/hr at  05/18/12 1036   PRN Meds:.sodium chloride, acetaminophen, acetaminophen, ALPRAZolam, alum & mag hydroxide-simeth, HYDROcodone-acetaminophen, morphine injection, ondansetron (ZOFRAN) IV, ondansetron, polyethylene glycol, sodium chloride, zolpidem  Assessment: Cellulitis/abscess of the right great toe.  Diabetes mellitus with peripheral neuropathy.  Concern for osteomyelitis.    Plan: Hematology note was reviewed.  No contraindication to proceeding with surgery was noted.  A Betadine dressing was applied to the right great toe.  This is to be changed daily.  He is scheduled for incision and drainage with debridement of the right great toe tomorrow.  He is n.p.o. after midnight.  A labeled white blood cell scan has been ordered.  The patient is aware of this.  If the findings are suggestive of osteomyelitis then he may ultimately require a partial or complete amputation of the digit.  [name] 05/18/2012, 1:00 PM

## 2012-05-18 NOTE — Progress Notes (Signed)
[facility] PROGRESS NOTE  [name] [name] ZOX:[id number] DOB: 06-Jun-1951 DOA: 05/15/2012 PCP: [name], PA  Assessment/Plan: Principal Problem:  Cellulitis of great toe of right foot with abscess. Patient is on vancomycin and Zosyn. He's been seen by podiatry with plans for incision and drainage tomorrow. A tagged WBC scan has also been ordered to rule out any underlying osteomyelitis.  Active Problems:  Thrombocytopenia Seen by hematology. Felt to be secondary to underlying infection. Abdominal ultrasound shows hepatosplenomegaly.   Diabetes mellitus type 2, uncontrolled Continue Lantus and sliding scale insulin. 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 His weight to be further confirmed with a Western blot. We will order CD4 counts as well as HIV RNA.   Brief narrative: Patient is a 61 year old white male admitted with cellulitis of the great toe.   Consultants:  Orthopedic 8/19  Procedures:  None  Antibiotics:  Zosyn 8/19  Vancomycin 8/19 ?  HPI/Subjective: Denies any complaints today.  Objective: Filed Vitals:   05/15/12 1822 05/15/12 1854 05/15/12 2108 05/16/12 0511  BP: 150/71 153/86 162/81 152/82  Pulse: 64 71 65 66  Temp: 98.8 F (37.1 C) 98.1 F (36.7 C) 98.1 F (36.7 C) 97.9 F (36.6 C)  TempSrc:   Oral Oral  Resp: 16 18 20 20   Height:      Weight:      SpO2: 99% 99% 97% 98%    Intake/Output Summary (Last 24 hours) at 05/16/12 1129 Last data filed at 05/16/12 0800  Gross per 24 hour  Intake      0 ml  Output      0 ml  Net      0 ml    Exam:   General:  Sitting in chair without any signs of distress  Cardiovascular: Regular rate rhythm  Respiratory: Clear to auscultations bilaterally  ABDOMEN soft nontender nondistended positive bowel sounds  EXT: Right foot with dressing which was not removed.  Data  Reviewed: Basic Metabolic Panel:  Lab 05/16/12 8119 05/15/12 1430  NA 140 136  K 3.9 3.5  CL 107 103  CO2 25 26  GLUCOSE 195* 226*  BUN 10 13  CREATININE 0.86 0.85  CALCIUM 9.0 9.6  MG -- --  PHOS -- --     Lab 05/16/12 0501 05/15/12 1430  WBC 5.6 8.7  NEUTROABS -- 5.8  HGB 12.9* 13.3  HCT 36.5* 37.6*  MCV 89.5 89.7  PLT 86* 89*   CBG:  Lab 05/16/12 0730 05/15/12 2105 05/15/12 1401  GLUCAP 130* 147* 246*     Studies: Dg Toe Great Right  05/15/2012  *RADIOLOGY REPORT*  Clinical Data: 61 year old male with recent injury.  Toe swelling and redness.  History of prior surgery.  RIGHT GREAT TOE  Comparison: None.  Findings: Extensive soft tissue swelling of the left first toe is evident, especially medially.  No subcutaneous gas identified. Arthrodesis hardware traversing the right first phalanges appears intact but there is some surrounding lucency.  No comparison. Right first MTP joint appears within normal limits.  No definite osteolysis.  IMPRESSION: Soft tissue swelling with no definite acute osseous abnormality. There is lucency ( presumably chronic) along the course of the first toe arthrodesis hardware.  Original Report Authenticated By: Ulla Potash III, M.D.    Scheduled Meds:   . sodium chloride   Intravenous STAT  . docusate sodium  100 mg Oral BID  . imipenem-cilastatin (  PRIMAXIN) 500 MG IV (MINIBAG PLUS)  500 mg Intravenous Once  . insulin aspart  9 Units Subcutaneous TID AC  . insulin glargine  25 Units Subcutaneous QHS  . levothyroxine  50 mcg Oral Daily  . pantoprazole  40 mg Oral Q1200  . piperacillin-tazobactam (ZOSYN)  IV  3.375 g Intravenous Q8H  . simvastatin  10 mg Oral QHS  . sodium chloride  1,000 mL Intravenous Once  . sodium chloride  3 mL Intravenous Q12H  . vancomycin  1,000 mg Intravenous Once  . vancomycin  1,000 mg Intravenous Q12H   Continuous Infusions:   . sodium chloride 75 mL/hr at 05/16/12 1058  . DISCONTD: sodium chloride 125  mL/hr at 05/15/12 1737    Code Status: Full code Family Communication: None  Disposition Plan: Home when medically stable  [name] [name] [phone]     Time Spent:25 min

## 2012-05-18 NOTE — Plan of Care (Signed)
Problem: Phase I Progression Outcomes Goal: Pain controlled with appropriate interventions Outcome: Completed/Met Date Met:  05/18/12 Pt states pain relief with po pain medications.

## 2012-05-18 NOTE — Progress Notes (Addendum)
Inpatient Diabetes Program Recommendations  AACE/ADA: New Consensus Statement on Inpatient Glycemic Control (2013)  Target Ranges:  Prepandial:   less than 140 mg/dL      Peak postprandial:   less than 180 mg/dL (1-2 hours)      Critically ill patients:  140 - 180 mg/dL   Reason for Visit: Results for [name] [name], [name] [name] (MRN [id number]) as of 05/18/2012 11:02  Ref. Range 05/16/2012 20:29 05/17/2012 07:21 05/17/2012 11:14 05/17/2012 16:43 05/17/2012 20:06  Glucose-Capillary Latest Range: 70-99 mg/dL 147 (H) 829 (H) 562 (H) 125 (H) 189 (H)   Please add Novolog moderate tid with meals and HS.  Will follow. A1C=11.1% indicating very high CBG's prior to admit.  CBG's better controlled in the hospital.
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
## 2012-05-19 ENCOUNTER — Encounter (HOSPITAL_COMMUNITY): Payer: Self-pay | Admitting: Anesthesiology

## 2012-05-19 ENCOUNTER — Encounter (HOSPITAL_COMMUNITY): Payer: Medicare Other

## 2012-05-19 ENCOUNTER — Encounter (HOSPITAL_COMMUNITY): Payer: Self-pay | Admitting: *Deleted

## 2012-05-19 ENCOUNTER — Inpatient Hospital Stay (HOSPITAL_COMMUNITY): Payer: Medicare Other | Admitting: Anesthesiology

## 2012-05-19 ENCOUNTER — Encounter (HOSPITAL_COMMUNITY)
Admission: EM | Disposition: A | Discharge status: Home / Self Care | Payer: Self-pay | Source: Home / Self Care | Attending: Internal Medicine

## 2012-05-19 DIAGNOSIS — E7889 Other lipoprotein metabolism disorders: Secondary | ICD-10-CM

## 2012-05-19 DIAGNOSIS — R160 Hepatomegaly, not elsewhere classified: Secondary | ICD-10-CM

## 2012-05-19 DIAGNOSIS — R161 Splenomegaly, not elsewhere classified: Secondary | ICD-10-CM

## 2012-05-19 LAB — GLUCOSE, CAPILLARY
Glucose-Capillary: 110 mg/dL — ABNORMAL HIGH (ref 70–99)
Glucose-Capillary: 107 mg/dL — ABNORMAL HIGH (ref 70–99)
Glucose-Capillary: 106 mg/dL — ABNORMAL HIGH (ref 70–99)
Glucose-Capillary: 180 mg/dL — ABNORMAL HIGH (ref 70–99)
Glucose-Capillary: 146 mg/dL — ABNORMAL HIGH (ref 70–99)

## 2012-05-19 LAB — BASIC METABOLIC PANEL
Chloride: 104 mEq/L (ref 96–112)
GFR calc non Af Amer: 75 mL/min — ABNORMAL LOW (ref >90)
Glucose, Bld: 163 mg/dL — ABNORMAL HIGH (ref 70–99)
Potassium: 4 mEq/L (ref 3.5–5.1)
Sodium: 138 mEq/L (ref 135–145)

## 2012-05-19 LAB — SURGICAL PCR SCREEN
MRSA, PCR: NEGATIVE
Staphylococcus aureus: NEGATIVE

## 2012-05-19 LAB — CBC
HCT: 36.9 % — ABNORMAL LOW (ref 39.0–52.0)
Hemoglobin: 12.8 g/dL — ABNORMAL LOW (ref 13.0–17.0)
MCHC: 34.7 g/dL (ref 30.0–36.0)
RBC: 4.14 MIL/uL — ABNORMAL LOW (ref 4.22–5.81)
WBC: 5.8 10*3/uL (ref 4.0–10.5)

## 2012-05-19 SURGERY — INCISION AND DRAINAGE, ABSCESS
Anesthesia: Monitor Anesthesia Care | Site: Foot | Laterality: Right | Wound class: Dirty or Infected

## 2012-05-19 MED ORDER — BUPIVACAINE HCL (PF) 0.5 % IJ SOLN
INTRAMUSCULAR | Status: AC
  Filled 2012-05-19: qty 30

## 2012-05-19 MED ORDER — MIDAZOLAM HCL 2 MG/2ML IJ SOLN
1.0000 mg | INTRAMUSCULAR | Status: DC | PRN
  Administered 2012-05-19: 2 mg via INTRAVENOUS

## 2012-05-19 MED ORDER — ONDANSETRON HCL 4 MG/2ML IJ SOLN: 4.0000 mg | Freq: Once | INTRAMUSCULAR | Status: DC | PRN

## 2012-05-19 MED ORDER — MIDAZOLAM HCL 2 MG/2ML IJ SOLN
INTRAMUSCULAR | Status: AC
  Filled 2012-05-19: qty 2

## 2012-05-19 MED ORDER — PROPOFOL 10 MG/ML IV EMUL
INTRAVENOUS | Status: DC | PRN
  Administered 2012-05-19: 40 ug/kg/min via INTRAVENOUS

## 2012-05-19 MED ORDER — LACTATED RINGERS IV SOLN
INTRAVENOUS | Status: DC
  Administered 2012-05-19: 09:00:00 via INTRAVENOUS

## 2012-05-19 MED ORDER — FENTANYL CITRATE 0.05 MG/ML IJ SOLN
INTRAMUSCULAR | Status: DC | PRN
  Administered 2012-05-19 (×2): 50 ug via INTRAVENOUS

## 2012-05-19 MED ORDER — FENTANYL CITRATE 0.05 MG/ML IJ SOLN
INTRAMUSCULAR | Status: AC
  Filled 2012-05-19: qty 2

## 2012-05-19 MED ORDER — BUPIVACAINE HCL (PF) 0.5 % IJ SOLN
INTRAMUSCULAR | Status: DC | PRN
  Administered 2012-05-19: 20 mL

## 2012-05-19 MED ORDER — SODIUM CHLORIDE 0.9 % IR SOLN
Freq: Once | Status: AC
  Administered 2012-05-19: 11:00:00
  Filled 2012-05-19: qty 1

## 2012-05-19 MED ORDER — FENTANYL CITRATE 0.05 MG/ML IJ SOLN: 25.0000 ug | INTRAMUSCULAR | Status: DC | PRN

## 2012-05-19 MED ORDER — MIDAZOLAM HCL 5 MG/5ML IJ SOLN
INTRAMUSCULAR | Status: DC | PRN
  Administered 2012-05-19 (×2): 2 mg via INTRAVENOUS

## 2012-05-19 SURGICAL SUPPLY — 42 items
BAG DECANTER FOR FLEXI CONT (MISCELLANEOUS) ×2 IMPLANT
BAG HAMPER (MISCELLANEOUS) ×2 IMPLANT
BANDAGE ELASTIC 4 VELCRO NS (GAUZE/BANDAGES/DRESSINGS) ×2 IMPLANT
BANDAGE ESMARK 4X12 BL STRL LF (DISPOSABLE) IMPLANT
BANDAGE GAUZE ELAST BULKY 4 IN (GAUZE/BANDAGES/DRESSINGS) ×4 IMPLANT
BENZOIN TINCTURE PRP APPL 2/3 (GAUZE/BANDAGES/DRESSINGS) IMPLANT
BLADE SURG 15 STRL LF DISP TIS (BLADE) ×1 IMPLANT
BLADE SURG 15 STRL SS (BLADE) ×1
BNDG ESMARK 4X12 BLUE STRL LF (DISPOSABLE)
CLOTH BEACON ORANGE TIMEOUT ST (SAFETY) ×2 IMPLANT
COVER LIGHT HANDLE STERIS (MISCELLANEOUS) ×4 IMPLANT
CUFF TOURNIQUET SINGLE 18IN (TOURNIQUET CUFF) ×2 IMPLANT
DRAPE EXTREMITY T 121X128X90 (DRAPE) ×2 IMPLANT
DRSG ADAPTIC 3X8 NADH LF (GAUZE/BANDAGES/DRESSINGS) ×2 IMPLANT
ELECT REM PT RETURN 9FT ADLT (ELECTROSURGICAL) ×2
ELECTRODE REM PT RTRN 9FT ADLT (ELECTROSURGICAL) ×1 IMPLANT
GLOVE BIO SURGEON STRL SZ7.5 (GLOVE) ×2 IMPLANT
GLOVE BIOGEL PI IND STRL 7.0 (GLOVE) ×1 IMPLANT
GLOVE BIOGEL PI INDICATOR 7.0 (GLOVE) ×1
GLOVE EXAM NITRILE MD LF STRL (GLOVE) ×2 IMPLANT
GLOVE SS BIOGEL STRL SZ 6.5 (GLOVE) ×1 IMPLANT
GLOVE SUPERSENSE BIOGEL SZ 6.5 (GLOVE) ×1
GOWN STRL REIN XL XLG (GOWN DISPOSABLE) ×4 IMPLANT
KIT ROOM TURNOVER APOR (KITS) ×2 IMPLANT
MARKER SKIN DUAL TIP RULER LAB (MISCELLANEOUS) ×2 IMPLANT
NEEDLE HYPO 18GX1.5 BLUNT FILL (NEEDLE) IMPLANT
NEEDLE HYPO 27GX1-1/4 (NEEDLE) ×4 IMPLANT
NS IRRIG 1000ML POUR BTL (IV SOLUTION) ×2 IMPLANT
PACK BASIC LIMB (CUSTOM PROCEDURE TRAY) ×2 IMPLANT
PAD ARMBOARD 7.5X6 YLW CONV (MISCELLANEOUS) ×2 IMPLANT
SET BASIN LINEN APH (SET/KITS/TRAYS/PACK) ×2 IMPLANT
SOL PREP PROV IODINE SCRUB 4OZ (MISCELLANEOUS) ×2 IMPLANT
SPONGE GAUZE 4X4 12PLY (GAUZE/BANDAGES/DRESSINGS) ×2 IMPLANT
SPONGE LAP 18X18 X RAY DECT (DISPOSABLE) ×2 IMPLANT
STRIP CLOSURE SKIN 1/2X4 (GAUZE/BANDAGES/DRESSINGS) IMPLANT
SUT PROLENE 4 0 PS 2 18 (SUTURE) ×2 IMPLANT
SUT VIC AB 4-0 PS2 27 (SUTURE) IMPLANT
SWAB CULTURE LIQ STUART DBL (MISCELLANEOUS) ×2 IMPLANT
SYR BULB IRRIGATION 50ML (SYRINGE) ×2 IMPLANT
SYR CONTROL 10ML LL (SYRINGE) ×6 IMPLANT
TUBE ANAEROBIC PORT A CUL  W/M (MISCELLANEOUS) ×2 IMPLANT
YANKAUER SUCT 12FT TUBE ARGYLE (SUCTIONS) ×2 IMPLANT

## 2012-05-19 NOTE — Transfer of Care (Signed)
Immediate Anesthesia Transfer of Care Note  Patient: [name] [name]  Procedure(s) Performed: Procedure(s) (LRB): INCISION AND DRAINAGE ABSCESS (Right)  Patient Location: PACU  Anesthesia Type: MAC  Level of Consciousness: awake and patient cooperative  Airway & Oxygen Therapy: Patient Spontanous Breathing and Patient connected to face mask oxygen  Post-op Assessment: Report given to PACU RN, Post -op Vital signs reviewed and stable and Patient moving all extremities  Post vital signs: Reviewed and stable  Complications: No apparent anesthesia complications

## 2012-05-19 NOTE — Anesthesia Postprocedure Evaluation (Signed)
  Anesthesia Post-op Note  Patient: [name] [name]  Procedure(s) Performed: Procedure(s) (LRB): INCISION AND DRAINAGE ABSCESS (Right)  Patient Location: PACU  Anesthesia Type: MAC  Level of Consciousness: awake, alert , oriented and patient cooperative  Airway and Oxygen Therapy: Patient Spontanous Breathing  Post-op Pain: none  Post-op Assessment: Post-op Vital signs reviewed, Patient's Cardiovascular Status Stable, Respiratory Function Stable, Patent Airway and Pain level controlled  Post-op Vital Signs: Reviewed and stable  Complications: No apparent anesthesia complications

## 2012-05-19 NOTE — Anesthesia Preprocedure Evaluation (Addendum)
Anesthesia Evaluation  Patient identified by MRN, date of birth, ID band Patient awake    Reviewed: Allergy & Precautions, H&P , NPO status , Patient's Chart, lab work & pertinent test results  Airway Mallampati: II TM Distance: >3 FB     Dental  (+) Edentulous Upper and Edentulous Lower   Pulmonary former smoker,  breath sounds clear to auscultation        Cardiovascular hypertension, Pt. on medications Rhythm:Regular Rate:Normal     Neuro/Psych    GI/Hepatic negative GI ROS,   Endo/Other  Well Controlled, Type 2, Insulin DependentHypothyroidism   Renal/GU      Musculoskeletal   Abdominal   Peds  Hematology   Anesthesia Other Findings   Reproductive/Obstetrics                           Anesthesia Physical Anesthesia Plan  ASA: III  Anesthesia Plan: MAC   Post-op Pain Management:    Induction: Intravenous  Airway Management Planned: Nasal Cannula  Additional Equipment:   Intra-op Plan:   Post-operative Plan:   Informed Consent: I have reviewed the patients History and Physical, chart, labs and discussed the procedure including the risks, benefits and alternatives for the proposed anesthesia with the patient or authorized representative who has indicated his/her understanding and acceptance.     Plan Discussed with:   Anesthesia Plan Comments:         Anesthesia Quick Evaluation

## 2012-05-19 NOTE — Progress Notes (Signed)
UR Chart Review Completed  

## 2012-05-19 NOTE — Progress Notes (Signed)
[facility]  [name] [name] WUJ:[id number] DOB: 12-05-1950 DOA: 05/15/2012 PCP: [name], PA  Assessment/Plan: Principal Problem:  Cellulitis of great toe of right foot with abscess. Patient is on vancomycin and Zosyn. He underwent incision and drainage of abscess today. A tagged WBC scan has also been ordered to rule out any underlying osteomyelitis.  Active Problems:  Thrombocytopenia Seen by hematology. Felt to be secondary to underlying infection. Abdominal ultrasound shows hepatosplenomegaly. This wasn't further worked up as an outpatient. Hepatitis workup was negative.   Diabetes mellitus type 2, uncontrolled Continue Lantus and sliding scale insulin. 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 This will need to be further confirmed with a Western blot. We have ordered CD4 counts as well as HIV RNA. I discussed this with the patient and with his permission, in front of his wife.   Brief narrative: Patient is a 61 year old white male admitted with cellulitis of the great toe.   Consultants:  Orthopedic 8/19  Procedures:  Incision and drainage of abscess right hallux on 8/22  Debridement of right hallux on 8/22  Antibiotics:  Zosyn 8/19  Vancomycin 8/19 ?  HPI/Subjective: Denies any complaints today.  Objective: Filed Vitals:   05/15/12 1822 05/15/12 1854 05/15/12 2108 05/16/12 0511  BP: 150/71 153/86 162/81 152/82  Pulse: 64 71 65 66  Temp: 98.8 F (37.1 C) 98.1 F (36.7 C) 98.1 F (36.7 C) 97.9 F (36.6 C)  TempSrc:   Oral Oral  Resp: 16 18 20 20   Height:      Weight:      SpO2: 99% 99% 97% 98%    Intake/Output Summary (Last 24 hours) at 05/16/12 1129 Last data filed at 05/16/12 0800  Gross per 24 hour  Intake      0 ml  Output      0 ml  Net      0 ml    Exam:   General:  Resting in bed without any signs of  distress  Cardiovascular: Regular rate rhythm  Respiratory: Clear to auscultations bilaterally  ABDOMEN soft nontender nondistended positive bowel sounds  EXT: Right foot with dressing which was not removed.  Data Reviewed: Basic Metabolic Panel:  Lab 05/16/12 9562 05/15/12 1430  NA 140 136  K 3.9 3.5  CL 107 103  CO2 25 26  GLUCOSE 195* 226*  BUN 10 13  CREATININE 0.86 0.85  CALCIUM 9.0 9.6  MG -- --  PHOS -- --     Lab 05/16/12 0501 05/15/12 1430  WBC 5.6 8.7  NEUTROABS -- 5.8  HGB 12.9* 13.3  HCT 36.5* 37.6*  MCV 89.5 89.7  PLT 86* 89*   CBG:  Lab 05/16/12 0730 05/15/12 2105 05/15/12 1401  GLUCAP 130* 147* 246*     Studies: Dg Toe Great Right  05/15/2012  *RADIOLOGY REPORT*  Clinical Data: 61 year old male with recent injury.  Toe swelling and redness.  History of prior surgery.  RIGHT GREAT TOE  Comparison: None.  Findings: Extensive soft tissue swelling of the left first toe is evident, especially medially.  No subcutaneous gas identified. Arthrodesis hardware traversing the right first phalanges appears intact but there is some surrounding lucency.  No comparison. Right first MTP joint appears within normal limits.  No definite osteolysis.  IMPRESSION: Soft tissue swelling with no definite acute osseous abnormality. There is lucency ( presumably chronic) along the course of the  first toe arthrodesis hardware.  Original Report Authenticated By: Ulla Potash III, M.D.    Scheduled Meds:   . sodium chloride   Intravenous STAT  . docusate sodium  100 mg Oral BID  . imipenem-cilastatin (PRIMAXIN) 500 MG IV (MINIBAG PLUS)  500 mg Intravenous Once  . insulin aspart  9 Units Subcutaneous TID AC  . insulin glargine  25 Units Subcutaneous QHS  . levothyroxine  50 mcg Oral Daily  . pantoprazole  40 mg Oral Q1200  . piperacillin-tazobactam (ZOSYN)  IV  3.375 g Intravenous Q8H  . simvastatin  10 mg Oral QHS  . sodium chloride  1,000 mL Intravenous Once  . sodium  chloride  3 mL Intravenous Q12H  . vancomycin  1,000 mg Intravenous Once  . vancomycin  1,000 mg Intravenous Q12H   Continuous Infusions:   . sodium chloride 75 mL/hr at 05/16/12 1058  . DISCONTD: sodium chloride 125 mL/hr at 05/15/12 1737    Code Status: Full code Family Communication: None  Disposition Plan: Home when medically stable  Shian Goodnow [phone]     Time Spent:25 min

## 2012-05-19 NOTE — Progress Notes (Signed)
Subjective: Patient seen resting in bed with his wife at the bedside.    I personally reviewed and went over laboratory results with the patient.  His iron studies, folate, and vitamin b12 are within normal limits.  His coagulation studies are unimpressive.  His platelet count continues to improve at 116,000 this AM.  Hepatitis panel is negative, but his HIV antibody is reactive.  The hospitalist has ordered confirmatory tests and has informed the patient and his wife of these results.  I explained to him and his wife that this test is a preliminary test and requires confirmation with further testing.   I personally reviewed and went over radiographic studies with the patient.  He does have some splenomegaly with 606 mL and hepatomegaly and probable mild fatty infiltration of the liver.  This may warrant further evaluation as an outpatient with a CT scan.   So we will be in the periphery for now and will monitor the patient's counts and follow-up on ordered lab work.  If he turns out to be HIV +, he will require a referral to Infectious disease.   Objective: Vital signs in last 24 hours: Temp:  [97.4 F (36.3 C)-98.1 F (36.7 C)] 97.4 F (36.3 C) (08/22 1225) Pulse Rate:  [49-70] 50  (08/22 1225) Resp:  [10-21] 18  (08/22 1225) BP: (122-174)/(69-90) 155/81 mmHg (08/22 1225) SpO2:  [97 %-100 %] 98 % (08/22 1225)  Intake/Output from previous day: 08/21 0800 - 08/22 0759 In: 4284 [P.O.:480; I.V.:3554; IV Piggyback:250] Out: -  Intake/Output this shift: Total I/O In: 788.8 [P.O.:240; I.V.:548.8] Out: 250 [Urine:250]  General appearance: alert, cooperative, no distress and mildly obese Extremities: right large toe is cleanly wrapped   Lab Results:   Basename 05/19/12 0455 05/18/12 0516  WBC 5.8 6.7  HGB 12.8* 13.4  HCT 36.9* 37.8*  PLT 116* 110*   BMET  Basename 05/19/12 0455 05/17/12 0835  NA 138 137  K 4.0 4.5  CL 104 104  CO2 25 25  GLUCOSE 163* 234*  BUN 10 9    CREATININE 1.03 0.99  CALCIUM 8.9 9.3    Studies/Results: US Abdomen Complete  05/18/2012  *RADIOLOGY REPORT*  Clinical Data:  Thrombocytopenia, question splenomegaly  COMPLETE ABDOMINAL ULTRASOUND  Comparison:  CT abdomen pelvis of 01/12/2011  Findings:  Gallbladder:  The gallbladder is visualized and no gallstones are noted.  There is no pain over the gallbladder with compression.  Common bile duct:  The common bile duct is normal measuring 4.7 mm in diameter.  Liver:  The liver is somewhat inhomogeneous and enlarged measuring 18.3 cm sagittally.  There may be mild fatty infiltration present. No ductal dilatation is seen.  IVC:  Appears normal.  Pancreas:  The pancreas is moderately well seen with no abnormality noted.  The pancreatic duct measures 1.4 mm in diameter.  Spleen:  The spleen is enlarged measuring 15.2 cm sagittally with a volume of 606 ml.  Multiple splenic calcifications are present consistent with prior granulomatous disease.  Right Kidney:  No hydronephrosis is seen.  The right kidney measures 11.3 cm sagittally.  Left Kidney:  No hydronephrosis is noted.  The left kidney measures 10.0 cm.  Abdominal aorta:  The abdominal aorta is normal in caliber.  IMPRESSION:  1.  Splenomegaly with volume of 606 ml. 2.  Hepatomegaly and probable mild fatty infiltration of the liver. 3.  No gallstones.   Original Report Authenticated By: Juline Patch, M.D.     Medications: I have  reviewed the patient's current medications.  Assessment/Plan: 1. Thrombocytopenia, stable and improving.  Likely secondary infection, but he does have splenomegaly, hepatomegaly with fatty infiltration, and a reactive HIV antibody.  These will require further work-up on an outpatient basis.  Suspect his platelet count will improve with continued resolution of infection.  Confirmatory tests ordered and pending for + HIV antibody.  Peripheral blood smear reviewed. WBCs are normal appearing.  Normal appearing platelets with  a rare large platelet.  This favors reactively low platelet count. Negative for hepatitis.  2. Continued antibiotics and treatment of infection.  Will defer to attending.  3. Patient is safe for surgical intervention if needed.      LOS: 4 days    Ercell Razon 05/19/2012

## 2012-05-19 NOTE — Brief Op Note (Signed)
BRIEF OPERATIVE NOTE  SURGEON:   Dallas Schimke, DPM  OR STAFF:   Eliane Decree Page, RN - Circulator Hurshel Party, CST - Scrub Person   PREOPERATIVE DIAGNOSIS:   1. Abscess right hallux 2. Wound nail bed right hallux  POSTOPERATIVE DIAGNOSIS: Same  PROCEDURE: 1. Incision and drainage of abscess right hallux 2. Debridement of right hallux  ANESTHESIA:  Monitor Anesthesia Care   HEMOSTASIS:   Pneumatic ankle tourniquet set at 250 mmHg  ESTIMATED BLOOD LOSS:   Minimal (<5 cc)  MATERIALS USED:  None  INJECTABLES: Marcaine 0.5% plain; 20mL  PATHOLOGY:   Culture swabs for aerobic and anaerobic culture and sensitivity  COMPLICATIONS:   None  DICTATION:  Dictated

## 2012-05-20 DIAGNOSIS — L02619 Cutaneous abscess of unspecified foot: Secondary | ICD-10-CM | POA: Diagnosis present

## 2012-05-20 DIAGNOSIS — Z21 Asymptomatic human immunodeficiency virus [HIV] infection status: Secondary | ICD-10-CM | POA: Clinically undetermined

## 2012-05-20 DIAGNOSIS — R162 Hepatomegaly with splenomegaly, not elsewhere classified: Secondary | ICD-10-CM | POA: Diagnosis present

## 2012-05-20 LAB — GLUCOSE, CAPILLARY: Glucose-Capillary: 94 mg/dL (ref 70–99)

## 2012-05-20 LAB — HIV-1 RNA QUANT-NO REFLEX-BLD
HIV 1 RNA Quant: <20 copies/mL (ref <20)
HIV-1 RNA Quant, Log: <1.3 {Log} (ref <1.30)

## 2012-05-20 LAB — CBC
MCV: 88.9 fL (ref 78.0–100.0)
Platelets: 103 10*3/uL — ABNORMAL LOW (ref 150–400)
RBC: 4.15 MIL/uL — ABNORMAL LOW (ref 4.22–5.81)
WBC: 5.4 10*3/uL (ref 4.0–10.5)

## 2012-05-20 MED ORDER — HYDROCODONE-ACETAMINOPHEN 5-325 MG PO TABS: 1.0000 | ORAL_TABLET | Freq: Four times a day (QID) | ORAL | Status: AC | PRN

## 2012-05-20 MED ORDER — PRO-STAT SUGAR FREE PO LIQD
30.0000 mL | Freq: Three times a day (TID) | ORAL | Status: DC
  Administered 2012-05-20: 30 mL via ORAL
  Filled 2012-05-20: qty 30

## 2012-05-20 MED ORDER — AMOXICILLIN-POT CLAVULANATE 875-125 MG PO TABS: 1.0000 | ORAL_TABLET | Freq: Two times a day (BID) | ORAL | Status: AC

## 2012-05-20 MED ORDER — ADULT MULTIVITAMIN LIQUID CH
5.0000 mL | Freq: Every day | ORAL | Status: DC
  Filled 2012-05-20 (×4): qty 5

## 2012-05-20 NOTE — Anesthesia Postprocedure Evaluation (Signed)
  Anesthesia Post-op Note  Patient: [name] [name]  Procedure(s) Performed: Procedure(s) (LRB): INCISION AND DRAINAGE ABSCESS (Right)  Patient Location: Room 320  Anesthesia Type: MAC  Level of Consciousness: awake, alert , oriented and patient cooperative  Airway and Oxygen Therapy: Patient Spontanous Breathing  Post-op Pain: mild  Post-op Assessment: Post-op Vital signs reviewed, Patient's Cardiovascular Status Stable, Respiratory Function Stable, Patent Airway and No signs of Nausea or vomiting  Post-op Vital Signs: Reviewed and stable  Complications: No apparent anesthesia complications

## 2012-05-20 NOTE — Addendum Note (Signed)
Addendum  created 05/20/12 1416 by Marolyn Hammock, CRNA   Modules edited:Notes Section

## 2012-05-20 NOTE — Consult Note (Signed)
ANTIBIOTIC CONSULT NOTE   Pharmacy Consult for Vancomycin and Zosyn Indication: cellulitis, diabetic toe infxn  No Known Allergies  Patient Measurements: Height: 5\' 9"  (175.3 cm) Weight: 200 lb (90.719 kg) IBW/kg (Calculated) : 70.7   Vital Signs: Temp: 97.6 F (36.4 C) (08/23 1440) Temp src: Oral (08/23 1440) BP: 163/69 mmHg (08/23 1440) Pulse Rate: 66  (08/23 1440) Intake/Output from previous day: 08/22 0701 - 08/23 0700 In: 1028.8 [P.O.:480; I.V.:548.8] Out: 250 [Urine:250] Intake/Output from this shift: Total I/O In: 530 [P.O.:530] Out: -   Labs:  Basename 05/20/12 0529 05/19/12 0455 05/18/12 0516  WBC 5.4 5.8 6.7  HGB 13.2 12.8* 13.4  PLT 103* 116* 110*  LABCREA -- -- --  CREATININE -- 1.03 --   Estimated Creatinine Clearance: 81.7 ml/min (by C-G formula based on Cr of 1.03). No results found for this basename: VANCOTROUGH:2,VANCOPEAK:2,VANCORANDOM:2,GENTTROUGH:2,GENTPEAK:2,GENTRANDOM:2,TOBRATROUGH:2,TOBRAPEAK:2,TOBRARND:2,AMIKACINPEAK:2,AMIKACINTROU:2,AMIKACIN:2, in the last 72 hours   Microbiology: Recent Results (from the past 720 hour(s))  URINE CULTURE     Status: Normal   Collection Time   05/15/12  2:25 PM      Component Value Range Status Comment   Specimen Description URINE, CLEAN CATCH   Final    Special Requests NONE   Final    Culture  Setup Time 05/15/2012 21:27   Final    Colony Count NO GROWTH   Final    Culture NO GROWTH   Final    Report Status 05/17/2012 FINAL   Final   SURGICAL PCR SCREEN     Status: Normal   Collection Time   05/19/12  4:20 AM      Component Value Range Status Comment   MRSA, PCR NEGATIVE  NEGATIVE Final    Staphylococcus aureus NEGATIVE  NEGATIVE Final   CULTURE, ROUTINE-ABSCESS     Status: Normal (Preliminary result)   Collection Time   05/19/12 10:42 AM      Component Value Range Status Comment   Specimen Description ABSCESS RIGHT GREAT TOE   Final    Special Requests NONE   Final    Gram Stain PENDING    Incomplete    Culture NO GROWTH 1 DAY   Final    Report Status PENDING   Incomplete   ANAEROBIC CULTURE     Status: Normal (Preliminary result)   Collection Time   05/19/12 10:42 AM      Component Value Range Status Comment   Specimen Description ABSCESS RIGHT GREAT TOE   Final    Special Requests NONE   Final    Gram Stain     Final    Value: NO WBC SEEN     NO SQUAMOUS EPITHELIAL CELLS SEEN     NO ORGANISMS SEEN   Culture     Final    Value: NO ANAEROBES ISOLATED; CULTURE IN PROGRESS FOR 5 DAYS   Report Status PENDING   Incomplete     Medical History: Past Medical History  Diagnosis Date  . Diabetes mellitus   . Thyroid disease   . High cholesterol   . Hypertension    Medications:  Scheduled:     . feeding supplement  30 mL Oral TID WC  . insulin aspart  9 Units Subcutaneous TID AC  . insulin glargine  25 Units Subcutaneous QHS  . levothyroxine  50 mcg Oral Daily  . multivitamin  5 mL Oral Daily  . pantoprazole  40 mg Oral Q1200  . piperacillin-tazobactam (ZOSYN)  IV  3.375 g Intravenous Q8H  .  simvastatin  10 mg Oral QHS  . sodium chloride  3 mL Intravenous Q12H  . vancomycin  1,000 mg Intravenous Q12H  . DISCONTD: docusate sodium  100 mg Oral BID   Assessment: 61yo male on day#6 empiric antibiotics with Vancomycin and Zosyn for cellulitis of right toe.  s/p I&D.  Good renal fxn.  Thrombocytopenia noted.   Goal of Therapy:  Vancomycin trough level 10-15 mcg/ml Eradicate infection.  Plan: Continue Zosyn 3.375gm iv q8hrs Continue Vancomycin 1gm iv q12hrs Weekly vancomycin trough and Scr while on Vancomycin  Archibald Marchetta, [facility] 05/20/2012,3:45 PM

## 2012-05-20 NOTE — Progress Notes (Signed)
Nutrition Follow-up  Intervention:   -Add ProStat 30 ml BID (100kcal, 15 gr protein each dose) -RD to follow for nutrition needs  Assessment:   Pt s/p incision and drainage of great toe today. Diet advanced and po intake is 50-100% of meals today. He has increased protein needs associated with wound healing.   Diet Order:  CHO modified medium (1600-2000 kcal/day).  Meds: Scheduled Meds:   . insulin aspart  9 Units Subcutaneous TID AC  . insulin glargine  25 Units Subcutaneous QHS  . levothyroxine  50 mcg Oral Daily  . pantoprazole  40 mg Oral Q1200  . piperacillin-tazobactam (ZOSYN)  IV  3.375 g Intravenous Q8H  . simvastatin  10 mg Oral QHS  . sodium chloride  3 mL Intravenous Q12H  . vancomycin  1,000 mg Intravenous Q12H  . DISCONTD: docusate sodium  100 mg Oral BID   Continuous Infusions:   . DISCONTD: sodium chloride 1,000 mL (05/19/12 2133)   PRN Meds:.sodium chloride, acetaminophen, acetaminophen, ALPRAZolam, alum & mag hydroxide-simeth, HYDROcodone-acetaminophen, morphine injection, ondansetron (ZOFRAN) IV, ondansetron, polyethylene glycol, sodium chloride, zolpidem  Labs:  CMP     Component Value Date/Time   NA 138 05/19/2012 0455   K 4.0 05/19/2012 0455   CL 104 05/19/2012 0455   CO2 25 05/19/2012 0455   GLUCOSE 163* 05/19/2012 0455   BUN 10 05/19/2012 0455   CREATININE 1.03 05/19/2012 0455   CALCIUM 8.9 05/19/2012 0455   PROT 5.6* 01/14/2011 0420   ALBUMIN 2.9* 01/14/2011 0420   AST 48* 01/14/2011 0420   ALT 28 01/14/2011 0420   ALKPHOS 77 01/14/2011 0420   BILITOT 0.7 01/14/2011 0420   GFRNONAA 75* 05/19/2012 0455   GFRAA 87* 05/19/2012 0455   CBG (last 3)   Basename 05/20/12 1148 05/20/12 0717 05/19/12 2037  GLUCAP 189* 94 146*     Intake/Output Summary (Last 24 hours) at 05/20/12 1527 Last data filed at 05/20/12 1250  Gross per 24 hour  Intake    770 ml  Output      0 ml  Net    770 ml    Weight Status: No New weight Wt Readings from Last 10 Encounters:    05/15/12 200 lb (90.719 kg)  05/15/12 200 lb (90.719 kg)    Nutrition Dx:  Food and nutrition knowledge deficit; improved. Education provided. However, needs reinforcement and follow-up with outpatient RD. Goal: Pt to meet >/= 90% of their estimated nutrition needs; progressing   Royann Shivers MS,RD,CSG, LDN Office: [phone] Pager: [phone]

## 2012-05-20 NOTE — Discharge Summary (Signed)
Physician Discharge Summary  [name] [name] LKG:[id number] DOB: Nov 15, 1950 DOA: 05/15/2012  PCP: [name], PA  Admit date: 05/15/2012 Discharge date: 05/20/2012  Recommendations for Outpatient Follow-up:  1. Patient will follow up with Dr. Nolen Mu on 8/28 for further wound care. 2. He has been referred to the infectious disease clinic. They will call him with a followup appointment to discuss positive HIV screen 3. 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 5 days ago patient noted that nail of great toe was partially ripped. He subsequently removed the whole nail with a pair of needle nose pliers. Afterwards there was quite a bit of bleeding. Over the last several days he has noticed that the toe was beginning to swell and starting to throb. Yesterday he was outside in the rain in wet shoes. When his toe began to throb more today he came to the emergency department.  He has a history of an extra toe (joined to medial side of great toe) which was removed in the 1980s.  In the emergency department he was noted to be afebrile and vital signs were stable. Exam notable for great toe cellulitis with blister. CBC and BMP were unremarkable except for hyperglycemia. X-ray of right great toe did not show evidence of osteomyelitis and revealed arthrodesis hardware.   Hospital Course:  This gentleman presents to the hospital with complaints of right great toe pain. He had initially partially ripped the nail of the great toe and then tried to remove the remainder  with repair of needle nose pliers. For the past several days prior to admission he began to notice swelling and throbbing in his toe. On arrival to the ER she was noted to have great toe cellulitis with blister. He was admitted to the hospital for further management. He was seen in consultation by Dr. Nolen Mu from podiatry. He underwent incision and drainage of great toe abscess. Wound cultures have been unremarkable. A tagged WBC scan was ordered to rule out any underlying osteomyelitis. Unfortunately this could not be done prior to surgery. Dr. Nolen Mu will continue to follow the patient as an outpatient and will order this scan as an outpatient if indicated. Patient will complete 10 more days of oral antibiotics and follow with Dr. Nolen Mu for further postoperative care.  Patient was noted to have thrombocytopenia during his hospital stay. His platelet counts ranging between 100-110. Hematology consultation was ordered. Ultrasound of the abdomen was performed which did show some mild hepatosplenomegaly. HIV screen was also found to be positive. Confirmatory Western blot is currently pending as is HIV RNA. The patient will be set up to be seen in the infectious disease clinic for followup of these tests. He will also need further followup of his hepatosplenomegaly in the outpatient setting. This can be done by his primary doctor with a CT of the abdomen to further characterize spleen and liver.  Procedures:  Incision and drainage of abscess right hallux on 8/22  Debridement of right hallux on 8/22   Consultations:  Hematology, Dr. Mariel Sleet  Podiatry Dr. Nolen Mu  Discharge Exam:  Filed Vitals:   05/20/12 1440  BP: 163/69  Pulse: 66  Temp: 97.6 F (36.4 C)  Resp: 18   Filed Vitals:   05/19/12 1225 05/19/12 2040 05/20/12 0512 05/20/12 1440  BP: 155/81 137/73 167/89 163/69  Pulse: 50 56 59 66  Temp: 97.4 F (36.3 C) 98.1 F (36.7 C) 98.1 F (36.7 C) 97.6 F (36.4 C)  TempSrc:  Oral  Oral Oral  Resp: 18 18 18 18   Height:      Weight:      SpO2: 98% 97% 100% 98%    General: No acute distress Cardiovascular: S1, S2, regular rate and rhythm Respiratory: Clear to auscultation bilaterally  Discharge Instructions  Discharge Orders    Future Orders Please Complete By Expires   Ambulatory referral to Nutrition and Diabetic Education      Comments:   HgbA1C: 11.1%   Diet - low sodium heart healthy      Diet Carb Modified      Increase activity slowly      Call MD for:  temperature >100.4      Call MD for:  redness, tenderness, or signs of infection (pain, swelling, redness, odor or green/yellow discharge around incision site)      Call MD for:  severe uncontrolled pain        Medication List  As of 05/20/2012  5:20 PM   TAKE these medications         ALPRAZolam 0.5 MG tablet   Commonly known as: XANAX   Take 0.5 mg by mouth 3 (three) times daily as needed. For anxiety      amoxicillin-clavulanate 875-125 MG per tablet   Commonly known as: AUGMENTIN   Take 1 tablet by mouth 2 (two) times daily.      HYDROcodone-acetaminophen 5-325 MG per tablet   Commonly known as: NORCO/VICODIN   Take 1-2 tablets by mouth every 6 (six) hours as needed.      insulin aspart 100 UNIT/ML injection   Commonly known as: novoLOG   Inject 9 Units into the skin 3 (three) times daily before meals.      LANTUS SOLOSTAR 100 UNIT/ML injection   Generic drug: insulin glargine   Inject 50 Units into the skin at bedtime.      levothyroxine 50 MCG tablet   Commonly known as: SYNTHROID, LEVOTHROID   Take 50 mcg by mouth daily.      meloxicam 7.5 MG tablet   Commonly known as: MOBIC   Take 7.5 mg by mouth daily.      metFORMIN 500 MG tablet   Commonly known as: GLUCOPHAGE   Take 500 mg by mouth 2 (two) times daily with a meal.      omeprazole 20 MG capsule   Commonly known as: PRILOSEC   Take 20 mg by mouth daily.      simvastatin 10 MG tablet   Commonly known as: ZOCOR    Take 10 mg by mouth at bedtime.           Follow-up Information    Follow up with [facility], DPM. (05/25/12 at 1:30pm)    Contact information:   [address] [place] [address] [phone]       Follow up with [name], PA in 2 weeks.   Contact information:   [address] [place] [address] [phone]       Follow up with Infectious Disease clinic in [place] will call you with an  appointment.          The results of significant diagnostics from this hospitalization (including imaging, microbiology, ancillary and laboratory) are listed below for reference.    Significant Diagnostic Studies: US Abdomen Complete  05/18/2012  *RADIOLOGY REPORT*  Clinical Data:  Thrombocytopenia, question splenomegaly  COMPLETE ABDOMINAL ULTRASOUND  Comparison:  CT abdomen pelvis of 01/12/2011  Findings:  Gallbladder:  The gallbladder is visualized and no gallstones are noted.  There is no pain over the gallbladder with compression.  Common bile duct:  The common bile duct is normal measuring 4.7 mm in diameter.  Liver:  The liver is somewhat inhomogeneous and enlarged measuring 18.3 cm sagittally.  There may be mild fatty infiltration present. No ductal dilatation is seen.  IVC:  Appears normal.  Pancreas:  The pancreas is moderately well seen with no abnormality noted.  The pancreatic duct measures 1.4 mm in diameter.  Spleen:  The spleen is enlarged measuring 15.2 cm sagittally with a volume of 606 ml.  Multiple splenic calcifications are present consistent with prior granulomatous disease.  Right Kidney:  No hydronephrosis is seen.  The right kidney measures 11.3 cm sagittally.  Left Kidney:  No hydronephrosis is noted.  The left kidney measures 10.0 cm.  Abdominal aorta:  The abdominal aorta is normal in caliber.  IMPRESSION:  1.  Splenomegaly with volume of 606 ml. 2.  Hepatomegaly and probable mild fatty infiltration of the liver. 3.  No  gallstones.   Original Report Authenticated By: Juline Patch, M.D.    Dg Toe Great Right  05/15/2012  *RADIOLOGY REPORT*  Clinical Data: 61 year old male with recent injury.  Toe swelling and redness.  History of prior surgery.  RIGHT GREAT TOE  Comparison: None.  Findings: Extensive soft tissue swelling of the left first toe is evident, especially medially.  No subcutaneous gas identified. Arthrodesis hardware traversing the right first phalanges appears intact but there is some surrounding lucency.  No comparison. Right first MTP joint appears within normal limits.  No definite osteolysis.  IMPRESSION: Soft tissue swelling with no definite acute osseous abnormality. There is lucency ( presumably chronic) along the course of the first toe arthrodesis hardware.  Original Report Authenticated By: Harley Hallmark, M.D.    Microbiology: Recent Results (from the past 240 hour(s))  URINE CULTURE     Status: Normal   Collection Time   05/15/12  2:25 PM      Component Value Range Status Comment   Specimen Description URINE, CLEAN CATCH   Final    Special Requests NONE   Final    Culture  Setup Time 05/15/2012 21:27   Final    Colony Count NO GROWTH   Final    Culture NO GROWTH   Final    Report Status 05/17/2012 FINAL   Final   SURGICAL PCR SCREEN     Status: Normal   Collection Time   05/19/12  4:20 AM      Component Value Range Status Comment   MRSA, PCR NEGATIVE  NEGATIVE Final    Staphylococcus aureus NEGATIVE  NEGATIVE Final   CULTURE, ROUTINE-ABSCESS     Status: Normal (Preliminary result)   Collection Time   05/19/12 10:42 AM      Component Value Range Status Comment   Specimen Description ABSCESS RIGHT GREAT TOE   Final    Special Requests NONE   Final    Gram Stain PENDING   Incomplete    Culture NO GROWTH 1  DAY   Final    Report Status PENDING   Incomplete   ANAEROBIC CULTURE     Status: Normal (Preliminary result)   Collection Time   05/19/12 10:42 AM      Component Value Range  Status Comment   Specimen Description ABSCESS RIGHT GREAT TOE   Final    Special Requests NONE   Final    Gram Stain     Final    Value: NO WBC SEEN     NO SQUAMOUS EPITHELIAL CELLS SEEN     NO ORGANISMS SEEN   Culture     Final    Value: NO ANAEROBES ISOLATED; CULTURE IN PROGRESS FOR 5 DAYS   Report Status PENDING   Incomplete      Labs: Basic Metabolic Panel:  Lab 05/19/12 7425 05/17/12 0835 05/16/12 0501 05/15/12 1430  NA 138 137 140 136  K 4.0 4.5 3.9 3.5  CL 104 104 107 103  CO2 25 25 25 26   GLUCOSE 163* 234* 195* 226*  BUN 10 9 10 13   CREATININE 1.03 0.99 0.86 0.85  CALCIUM 8.9 9.3 9.0 9.6  MG -- -- -- --  PHOS -- -- -- --   Liver Function Tests: No results found for this basename: AST:5,ALT:5,ALKPHOS:5,BILITOT:5,PROT:5,ALBUMIN:5 in the last 168 hours No results found for this basename: LIPASE:5,AMYLASE:5 in the last 168 hours No results found for this basename: AMMONIA:5 in the last 168 hours CBC:  Lab 05/20/12 0529 05/19/12 0455 05/18/12 0516 05/17/12 0835 05/16/12 0501 05/15/12 1430  WBC 5.4 5.8 6.7 6.1 5.6 --  NEUTROABS -- -- -- -- -- 5.8  HGB 13.2 12.8* 13.4 13.2 12.9* --  HCT 36.9* 36.9* 37.8* 37.4* 36.5* --  MCV 88.9 89.1 88.1 89.3 89.5 --  PLT 103* 116* 110* 109* 86* --   Cardiac Enzymes: No results found for this basename: CKTOTAL:5,CKMB:5,CKMBINDEX:5,TROPONINI:5 in the last 168 hours BNP: BNP (last 3 results) No results found for this basename: PROBNP:3 in the last 8760 hours CBG:  Lab 05/20/12 1610 05/20/12 1148 05/20/12 0717 05/19/12 2037 05/19/12 1610  GLUCAP 186* 189* 94 146* 180*    Time coordinating discharge: Greater than 30 minutes  Signed:  MEMON,JEHANZEB  [facility] 05/20/2012, 5:20 PM

## 2012-05-20 NOTE — Progress Notes (Signed)
Podiatry Progress Note  Subjective: [name] [name] was seen at bedside with his wife and additional family members.  He states that he is feeling okay.  He has had some tenderness at his surgical site.  He has not received his surgical shoe.  Objective: Vital signs in last 24 hours:   Temp:  [97.6 F (36.4 C)-98.1 F (36.7 C)] 97.6 F (36.4 C) (08/23 1440) Pulse Rate:  [56-66] 66  (08/23 1440) Resp:  [18] 18  (08/23 1440) BP: (137-167)/(69-89) 163/69 mmHg (08/23 1440) SpO2:  [97 %-100 %] 98 % (08/23 1440)  Surgical dressing is clean, dry and intact.  The dressing was removed.  The nail bed is granular and healing well.  There is significant improvement in the redness and edema of the right great toe.  Pedal pulses are palpable.  Sensation is grossly intact.    Lab/Test Results:   Basename 05/20/12 0529 05/19/12 0455  WBC 5.4 5.8  HGB 13.2 12.8*  HCT 36.9* 36.9*  PLT 103* 116*  NA -- 138  K -- 4.0  CL -- 104  CO2 -- 25  BUN -- 10  CREATININE -- 1.03  GLUCOSE -- 163*  CALCIUM -- 8.9    Recent Results (from the past 240 hour(s))  URINE CULTURE     Status: Normal   Collection Time   05/15/12  2:25 PM      Component Value Range Status Comment   Specimen Description URINE, CLEAN CATCH   Final    Special Requests NONE   Final    Culture  Setup Time 05/15/2012 21:27   Final    Colony Count NO GROWTH   Final    Culture NO GROWTH   Final    Report Status 05/17/2012 FINAL   Final   SURGICAL PCR SCREEN     Status: Normal   Collection Time   05/19/12  4:20 AM      Component Value Range Status Comment   MRSA, PCR NEGATIVE  NEGATIVE Final    Staphylococcus aureus NEGATIVE  NEGATIVE Final   CULTURE, ROUTINE-ABSCESS     Status: Normal (Preliminary result)   Collection Time   05/19/12 10:42 AM      Component Value Range Status Comment   Specimen Description ABSCESS RIGHT GREAT TOE   Final    Special Requests NONE   Final    Gram Stain PENDING   Incomplete    Culture NO GROWTH 1 DAY    Final    Report Status PENDING   Incomplete   ANAEROBIC CULTURE     Status: Normal (Preliminary result)   Collection Time   05/19/12 10:42 AM      Component Value Range Status Comment   Specimen Description ABSCESS RIGHT GREAT TOE   Final    Special Requests NONE   Final    Gram Stain     Final    Value: NO WBC SEEN     NO SQUAMOUS EPITHELIAL CELLS SEEN     NO ORGANISMS SEEN   Culture     Final    Value: NO ANAEROBES ISOLATED; CULTURE IN PROGRESS FOR 5 DAYS   Report Status PENDING   Incomplete      No results found.  Medications:  Scheduled Meds:   . feeding supplement  30 mL Oral TID WC  . insulin aspart  9 Units Subcutaneous TID AC  . insulin glargine  25 Units Subcutaneous QHS  . levothyroxine  50 mcg Oral Daily  .  multivitamin  5 mL Oral Daily  . pantoprazole  40 mg Oral Q1200  . piperacillin-tazobactam (ZOSYN)  IV  3.375 g Intravenous Q8H  . simvastatin  10 mg Oral QHS  . sodium chloride  3 mL Intravenous Q12H  . vancomycin  1,000 mg Intravenous Q12H  . DISCONTD: docusate sodium  100 mg Oral BID   Continuous Infusions:   . DISCONTD: sodium chloride 1,000 mL (05/19/12 2133)   PRN Meds:.sodium chloride, acetaminophen, acetaminophen, ALPRAZolam, alum & mag hydroxide-simeth, HYDROcodone-acetaminophen, morphine injection, ondansetron (ZOFRAN) IV, ondansetron, polyethylene glycol, sodium chloride, zolpidem  Assessment: Cellulitis of the left great toe.  POD#1 S/P incision and drainage of abscess with debridement of nail bed of the left great toe.  Plan: Dressing changed.  A Betadine dressing was applied to the right great toe.  I instructed the patient and his family to change the dressing daily.  He may bear weight on his foot while wearing a surgical shoe.  He is okay for discharge from my standpoint.  I recommend that he be discharge on Augmentin 875 mg to be taken 1 tab PO BID x 10 days.  He is to follow-up with me at my private office on 05/25/2012 at 1:30 PM.  The  patient was made aware of this appointment and my office has been notified.    Tasmine Hipwell IVAN 05/20/2012, 4:54 PM

## 2012-05-20 NOTE — Op Note (Signed)
[name], [name] [name]                  ACCOUNT NO.:  [id number]  MEDICAL RECORD NO.:  [id number]  LOCATION:  [facility]                          FACILITY:  [facility]  PHYSICIAN:  B. Theola Sequin, MD   DATE OF BIRTH:  1950/11/02  DATE OF PROCEDURE:  05/19/2012 DATE OF DISCHARGE:                              OPERATIVE REPORT   SURGEON:  B. Theola Sequin, MD.  ASSISTANT:  None.  PREOPERATIVE DIAGNOSES:  Abscess right hallux and wound, nail bed, right hallux.  POSTOPERATIVE DIAGNOSES:  Abscess right hallux and wound, nail bed, right hallux.  PROCEDURES: 1. Incision and drainage of abscess, right hallux. 2. Debridement of nail bed, right hallux.  ANESTHESIA:  MAC with local.  HEMOSTASIS:  Pneumatic ankle tourniquet at 200 was applied, but not used during this procedure.  ESTIMATED BLOOD LOSS:  Minimal (less than 5 mL).  INJECTABLES:  0.5% Marcaine plain.  PATHOLOGY:  Culture swabs for aerobic and anaerobic culture and sensitivity.  COMPLICATIONS:  None.  PROCEDURE IN DETAIL:  The patient was brought to the operating room and placed on the operative table.  A pneumatic ankle tourniquet was placed about the patient's right ankle.  The foot was then anesthetized using 0.5% Marcaine plain.  The foot was scrubbed, prepped, and draped in the usual sterile manner.  Attention was directed to the distal aspect of the right hallux where there was an area of fluctuance present.  A #15 blade was used to make a small incision on the distal aspect of the digit.  Focal amount of purulence was encountered and expressed.  The wound was irrigated with copious amounts of bacitracin solution.  The incision was partially closed using a 4-0 Prolene.  No packing was utilized due to the minimal soft tissue deficit in the superficial nature of the abscess present.  Attention was directed to the nail bed, which was noted to be comprised of hyperkeratotic and fibrotic tissue present.  This was debrided  using a #15 blade and soft tissue curette down to bleeding viable tissue.  A sterile compressive dressing was applied to the right hallux.  The patient tolerated the procedure and anesthesia well.  He was transferred from the operating room to the postanesthesia care unit with vital signs stable and vascular status intact to all digits of the right foot.          ______________________________ B. Theola Sequin, MD     BIM/MEDQ  D:  05/19/2012  T:  05/20/2012  Job:  [id number]

## 2012-05-20 NOTE — Progress Notes (Signed)
Discharge instructions given to pt. And pt.'s wife with teach back given to RN. Surgical shoe applied to right foot, large post-op shoe. Pt. Placed in W/C and taken to car.

## 2012-05-21 LAB — HIV 1/2 CONFIRMATION
HIV-1 antibody: NEGATIVE
HIV-2 Ab: NEGATIVE

## 2012-05-22 LAB — CULTURE, ROUTINE-ABSCESS

## 2012-05-23 ENCOUNTER — Telehealth (HOSPITAL_COMMUNITY): Payer: Self-pay | Admitting: Dietician

## 2012-05-23 NOTE — Telephone Encounter (Signed)
Received referral from Inpatient Diabetes Coordinator. Noted recent discharge on 05/20/12 from [facility]. Sent letter to pt home via [place] Mail in attempt to contact pt to schedule appointment. Also provided information re: free group diabetes classes at [facility].

## 2012-05-24 LAB — ANAEROBIC CULTURE: Gram Stain: NONE SEEN

## 2012-05-31 NOTE — Telephone Encounter (Signed)
Sent letter to pt home via [place] Mail in attempt to contact pt to schedule appointment.  

## 2012-06-03 ENCOUNTER — Telehealth: Payer: Self-pay

## 2012-06-03 NOTE — Telephone Encounter (Signed)
Pt informed of intake visit with Infectious Disease intake.  Pt seemed a bit confused about the call and his reason for coming.  Upon referral as Hospital follow up we were informed pt was aware of his HIV status.  I will make sure the patient is aware of the purpose of visit when he comes on 06-07-12. After review of patient's labs I have determined he does not need to see me for an intake.  He will need a ID consult for abnormal labs. He is not HIV positive.  Pt will be added to Dr Feliz Beam schedule on 06-07-12 @ 1:30 pm .   Laurell Josephs, RN

## 2012-06-06 NOTE — Telephone Encounter (Signed)
Sent letter to pt home via [place] Mail in attempt to contact pt to schedule appointment.  

## 2012-06-07 ENCOUNTER — Ambulatory Visit: Payer: Medicare Other

## 2012-06-07 ENCOUNTER — Ambulatory Visit (INDEPENDENT_AMBULATORY_CARE_PROVIDER_SITE_OTHER): Payer: Medicare Other | Admitting: Internal Medicine

## 2012-06-07 ENCOUNTER — Encounter: Payer: Self-pay | Admitting: Internal Medicine

## 2012-06-07 VITALS — BP 162/90 | HR 87 | Temp 98.3°F | Wt 222.0 lb

## 2012-06-07 DIAGNOSIS — Z21 Asymptomatic human immunodeficiency virus [HIV] infection status: Secondary | ICD-10-CM

## 2012-06-07 NOTE — Progress Notes (Signed)
False positive HIV EIA,  Results notification Subjective:    Patient ID: [name] [name], male    DOB: 14-Dec-1950, 61 y.o.   MRN: [id number]  HPI [name] [name] is a 61yo Male who suffers from DM, hypothyroidism and recently had cellulitis of his left big toe due to trimming his nai/ingrown toe nail. In that setting of seeking care, he was tested for HIV which EIA was positive. He is now referred to our clinic for further testing. He has mixed results with some of his doctors saying it is a false positive, and others telling him he has HIV.  He has no risk factors for HIV: been monogamous for 61yo,married to his wife. No IVDU, MSM, or transfusions in the 80s.   Current Outpatient Prescriptions on File Prior to Visit  Medication Sig Dispense Refill  . ALPRAZolam (XANAX) 0.5 MG tablet Take 0.5 mg by mouth 3 (three) times daily as needed. For anxiety      . insulin aspart (NOVOLOG) 100 UNIT/ML injection Inject 9 Units into the skin 3 (three) times daily before meals.      . insulin glargine (LANTUS SOLOSTAR) 100 UNIT/ML injection Inject 50 Units into the skin at bedtime.      Marland Kitchen levothyroxine (SYNTHROID, LEVOTHROID) 50 MCG tablet Take 50 mcg by mouth daily.      . meloxicam (MOBIC) 7.5 MG tablet Take 7.5 mg by mouth daily.      . metFORMIN (GLUCOPHAGE) 500 MG tablet Take 500 mg by mouth 2 (two) times daily with a meal.      . omeprazole (PRILOSEC) 20 MG capsule Take 20 mg by mouth daily.      . simvastatin (ZOCOR) 10 MG tablet Take 10 mg by mouth at bedtime.       Active Ambulatory Problems    Diagnosis Date Noted  . Thrombocytopenia 05/15/2012  . Diabetes mellitus type 2, uncontrolled 05/15/2012  . Hypothyroidism 05/15/2012  . Cellulitis and abscess of toe 05/20/2012  . 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 Hepatosplenomegaly 05/20/2012   Resolved Ambulatory Problems    Diagnosis Date Noted  . No Resolved Ambulatory Problems   Past Medical History  Diagnosis Date  . Diabetes mellitus   . Thyroid  disease   . High cholesterol   . Hypertension    History  Substance Use Topics  . Smoking status: Former Games developer  . Smokeless tobacco: Not on file  . Alcohol Use: No  family history is not on file.    Review of Systems   Constitutional: Negative for fever, chills, diaphoresis, activity change, appetite change, fatigue and unexpected weight change.  HENT: Negative for congestion, sore throat, rhinorrhea, sneezing, trouble swallowing and sinus pressure.  Eyes: Negative for photophobia and visual disturbance.  Respiratory: Negative for cough, chest tightness, shortness of breath, wheezing and stridor.  Cardiovascular: Negative for chest pain, palpitations and leg swelling.  Gastrointestinal: Negative for nausea, vomiting, abdominal pain, diarrhea, constipation, blood in stool, abdominal distention and anal bleeding.  Genitourinary: Negative for dysuria, hematuria, flank pain and difficulty urinating.  Musculoskeletal: Negative for myalgias, back pain, joint swelling, arthralgias and gait problem.  Skin: Negative for color change, pallor, rash and wound.  Neurological: Negative for dizziness, tremors, weakness and light-headedness.  Hematological: Negative for adenopathy. Does not bruise/bleed easily.  Psychiatric/Behavioral: Negative for behavioral problems, confusion, sleep disturbance, dysphoric mood, decreased concentration and agitation.       Objective:   Physical Exam  Not needed for this visit Labs: HIV EIA reactive  WB negative HIV VL < 20 CD4 count 920     Assessment & Plan:   False positive HIV elisa test = informed the patient that he does not have HIV since his western blot is negative. In addition, HIV VL not detected and High CD 4 count...consistent with not having HIV.  Spent >42minutes review labs and counseling patient.  PCP: [facility]/ Dr. Prudy Feeler

## 2012-06-10 NOTE — Telephone Encounter (Signed)
Pt has not responded to attempts to contact to schedule appointment. Referral filed.  

## 2012-12-29 ENCOUNTER — Encounter: Payer: Self-pay | Admitting: *Deleted

## 2014-11-22 ENCOUNTER — Encounter: Payer: Self-pay | Admitting: Orthopedic Surgery

## 2014-12-27 ENCOUNTER — Encounter: Payer: Self-pay | Admitting: Orthopedic Surgery

## 2015-05-15 ENCOUNTER — Encounter: Payer: Self-pay | Admitting: Internal Medicine

## 2015-05-30 ENCOUNTER — Telehealth: Payer: Self-pay | Admitting: Nurse Practitioner

## 2015-05-30 ENCOUNTER — Encounter: Payer: Self-pay | Admitting: Nurse Practitioner

## 2015-05-30 ENCOUNTER — Ambulatory Visit: Payer: Self-pay | Admitting: Nurse Practitioner

## 2015-05-30 NOTE — Telephone Encounter (Signed)
Noted  

## 2015-05-30 NOTE — Telephone Encounter (Signed)
PATIENT WAS A NO SHOW AND LETTER SENT  °

## 2015-06-20 ENCOUNTER — Telehealth: Payer: Self-pay | Admitting: Gastroenterology

## 2015-06-20 ENCOUNTER — Encounter: Payer: Self-pay | Admitting: Gastroenterology

## 2015-06-20 ENCOUNTER — Ambulatory Visit: Payer: Self-pay | Admitting: Gastroenterology

## 2015-06-20 NOTE — Telephone Encounter (Signed)
PATIENT WAS A NO SHOW AND LETTER SENT  °

## 2015-07-02 ENCOUNTER — Ambulatory Visit: Payer: Self-pay | Admitting: Gastroenterology

## 2015-07-04 ENCOUNTER — Encounter: Payer: Self-pay | Admitting: Nurse Practitioner

## 2015-07-04 ENCOUNTER — Ambulatory Visit (INDEPENDENT_AMBULATORY_CARE_PROVIDER_SITE_OTHER): Payer: Medicare Other | Admitting: Nurse Practitioner

## 2015-07-04 VITALS — BP 141/78 | HR 68 | Temp 97.6°F | Ht 69.0 in | Wt 228.0 lb

## 2015-07-04 DIAGNOSIS — K7460 Unspecified cirrhosis of liver: Secondary | ICD-10-CM | POA: Diagnosis not present

## 2015-07-04 DIAGNOSIS — D696 Thrombocytopenia, unspecified: Secondary | ICD-10-CM
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
## 2015-07-04 NOTE — Patient Instructions (Addendum)
1. Have your labs drawn. 2. We will be schedule your ultrasound. 3. Call notify us of there is any change in symptoms, worsening symptoms. 4. Return for follow-up in 4-6 weeks. 5. As we discussed avoiding drinking any alcohol. 6. Start a low-fat diet with exercise.   Cirrhosis Cirrhosis is long-term (chronic) liver injury. The liver is your largest internal organ, and it performs many functions. The liver converts food into energy, removes toxic material from your blood, makes important proteins, and absorbs necessary vitamins from your diet. If you have cirrhosis, it means many of your healthy liver cells have been replaced by scar tissue. This prevents blood from flowing through your liver, which makes it difficult for your liver to function. This scarring is not reversible, but treatment can prevent it from getting worse.  CAUSES  Hepatitis C and long-term alcohol abuse are the most common causes of cirrhosis. Other causes include:  Nonalcoholic fatty liver disease.  Hepatitis B infection.  Autoimmune hepatitis.  Diseases that cause blockage of ducts inside the liver.  Inherited liver diseases.  Reactions to certain long-term medicines.  Parasitic infections.  Long-term exposure to certain toxins. RISK FACTORS You may have a higher risk of cirrhosis if you:  Have certain hepatitis viruses.  Abuse alcohol, especially if you are male.  Are overweight.  Share needles.  Have unprotected sex with someone who has hepatitis. SYMPTOMS  You may not have any signs and symptoms at first. Symptoms may not develop until the damage to your liver starts to get worse. Signs and symptoms of cirrhosis may include:   Tenderness in the right-upper part of your abdomen.  Weakness and tiredness (fatigue).  Loss of appetite.  Nausea.  Weight loss and muscle loss.  Itchiness.  Yellow skin and eyes (jaundice).  Buildup of fluid in the abdomen (ascites).  Swelling of the feet  and ankles (edema).  Appearance of tiny blood vessels under the skin.  Mental confusion.  Easy bruising and bleeding. DIAGNOSIS  Your health care provider may suspect cirrhosis based on your symptoms and medical history, especially if you have other medical conditions or a history of alcohol abuse. Your health care provider will do a physical exam to feel your liver and check for signs of cirrhosis. Your health care provider may perform other tests, including:   Blood tests to check:   Whether you have hepatitis B or C.   Kidney function.  Liver function.  Imaging tests such as:  MRI or CT scan to look for changes seen in advanced cirrhosis.  Ultrasound to see if normal liver tissue is being replaced by scar tissue.  A procedure using a long needle to take a sample of liver tissue (biopsy) for examination under a microscope. Liver biopsy can confirm the diagnosis of cirrhosis.  TREATMENT  Treatment depends on how damaged your liver is and what caused the damage. Treatment may include treating cirrhosis symptoms or treating the underlying causes of the condition to try to slow the progression of the damage. Treatment may include:  Making lifestyle changes, such as:   Eating a healthy diet.  Restricting salt intake.  Maintaining a healthy weight.   Not abusing drugs or alcohol.  Taking medicines to:  Treat liver infections or other infections.  Control itching.  Reduce fluid buildup.  Reduce certain blood toxins.  Reduce risk of bleeding from enlarged blood vessels in the stomach or esophagus (varices).  If varices are causing bleeding problems, you may need treatment with a  procedure that ties up the vessels causing them to fall off (band ligation).  If cirrhosis is causing your liver to fail, your health care provider may recommend a liver transplant.  Other treatments may be recommended depending on any complications of cirrhosis, such as liver-related  kidney failure (hepatorenal syndrome). HOME CARE INSTRUCTIONS   Take medicines only as directed by your health care provider. Do not use drugs that are toxic to your liver. Ask your health care provider before taking any new medicines, including over-the-counter medicines.   Rest as needed.  Eat a well-balanced diet. Ask your health care provider or dietitian for more information.   You may have to follow a low-salt diet or restrict your water intake as directed.  Do not drink alcohol. This is especially important if you are taking acetaminophen.  Keep all follow-up visits as directed by your health care provider. This is important. SEEK MEDICAL CARE IF:  You have fatigue or weakness that is getting worse.  You develop swelling of the hands, feet, legs, or face.  You have a fever.  You develop loss of appetite.  You have nausea or vomiting.  You develop jaundice.  You develop easy bruising or bleeding. SEEK IMMEDIATE MEDICAL CARE IF:  You vomit bright red blood or a material that looks like coffee grounds.  You have blood in your stools.  Your stools appear black and tarry.  You become confused.  You have chest pain or trouble breathing.   This information is not intended to replace advice given to you by your health care provider. Make sure you discuss any questions you have with your health care provider.   Document Released: 09/14/2005 Document Revised: 10/05/2014 Document Reviewed: 05/23/2014 Elsevier Interactive Patient Education 2016 [employer]   Fatty Liver Fatty liver, also called hepatic steatosis or steatohepatitis, is a condition in which too much fat has built up in your liver cells. The liver removes harmful substances from your bloodstream. It produces fluids your body needs. It also helps your body use and store energy from the food you eat. In many cases, fatty liver does not cause symptoms or problems. It is often diagnosed when tests are being  done for other reasons. However, over time, fatty liver can cause inflammation that may lead to more serious liver problems, such as scarring of the liver (cirrhosis). CAUSES  Causes of fatty liver may include:   Drinking too much alcohol.  Poor nutrition.  Obesity.  Cushing syndrome.  Diabetes.  Hyperlipidemia.  Pregnancy.  Certain drugs.  Poisons.  Some viral infections. RISK FACTORS You may be more likely to develop fatty liver if you:  Abuse alcohol.  Are pregnant.  Are overweight.  Have diabetes.  Have hepatitis.  Have a high triglyceride level.  SIGNS AND SYMPTOMS  Fatty liver often does not cause any symptoms. In cases where symptoms develop, they can include:  Fatigue.  Weakness.  Weight loss.  Confusion.   Abdominal pain.  Yellowing of your skin and the white parts of your eyes (jaundice).  Nausea and vomiting. DIAGNOSIS  Fatty liver may be diagnosed by:   Physical exam and medical history.  Blood tests.  Imaging tests, such as an ultrasound, CT scan, or MRI.  Liver biopsy. A small sample of liver tissue is removed using a needle. The sample is then looked at under a microscope. TREATMENT  Fatty liver is often caused by other health conditions. Treatment for fatty liver may involve medicines and lifestyle changes to  manage conditions such as:   Alcoholism.  High cholesterol.  Diabetes.  Being overweight or obese.  HOME CARE INSTRUCTIONS  Eat a healthy diet as directed by your health care provider.  Exercise regularly. This can help you lose weight and control your cholesterol and diabetes. Talk to your health care provider about an exercise plan and which activities are best for you.  Do not drink alcohol.   Take medicines only as directed by your health care provider. SEEK MEDICAL CARE IF: You have difficulty controlling your:  Blood sugar.  Cholesterol.  Alcohol consumption. SEEK IMMEDIATE MEDICAL CARE IF:  You  have abdominal pain.  You have jaundice.  You have nausea and vomiting.   This information is not intended to replace advice given to you by your health care provider. Make sure you discuss any questions you have with your health care provider.   Document Released: 10/30/2005 Document Revised: 10/05/2014 Document Reviewed: 01/24/2014 Elsevier Interactive Patient Education 2016 [employer]   Nonalcoholic Fatty Liver Disease Diet Nonalcoholic fatty liver disease is a condition that causes fat to accumulate in and around the liver. The disease makes it harder for the liver to work the way that it should. Following a healthy diet can help to keep nonalcoholic fatty liver disease under control. It can also help to prevent or improve conditions that are associated with the disease, such as heart disease, diabetes, high blood pressure, and abnormal cholesterol levels. Along with regular exercise, this diet:  Promotes weight loss.  Helps to control blood sugar levels.  Helps to improve the way that the body uses insulin. WHAT DO I NEED TO KNOW ABOUT THIS DIET?  Use the glycemic index (GI) to plan your meals. The index tells you how quickly a food will raise your blood sugar. Choose low-GI foods. These foods take a longer time to raise blood sugar.  Keep track of how many calories you take in. Eating the right amount of calories will help you to achieve a healthy weight.  You may want to follow a Mediterranean diet. This diet includes a lot of vegetables, lean meats or fish, whole grains, fruits, and healthy oils and fats. WHAT FOODS CAN I EAT? Grains Whole grains, such as whole-wheat or whole-grain breads, crackers, tortillas, cereals, and pasta. Stone-ground whole wheat. Pumpernickel bread. Unsweetened oatmeal. Bulgur. Barley. Quinoa. Brown or wild rice. Corn or whole-wheat flour tortillas. Vegetables Lettuce. Spinach. Peas. Beets. Cauliflower. Cabbage. Broccoli. Carrots. Tomatoes. Squash.  Eggplant. Herbs. Peppers. Onions. Cucumbers. Brussels sprouts. Yams and sweet potatoes. Beans. Lentils. Fruits Bananas. Apples. Oranges. Grapes. Papaya. Mango. Pomegranate. Kiwi. Grapefruit. Cherries. Meats and Other Protein Sources Seafood and shellfish. Lean meats. Poultry. Tofu. Dairy Low-fat or fat-free dairy products, such as yogurt, cottage cheese, and cheese. Beverages Water. Sugar-free drinks. Tea. Coffee. Low-fat or skim milk. Milk alternatives, such as soy or almond milk. Real fruit juice. Condiments Mustard. Relish. Low-fat, low-sugar ketchup and barbecue sauce. Low-fat or fat-free mayonnaise. Sweets and Desserts Sugar-free sweets. Fats and Oils Avocado. Canola or olive oil. Nuts and nut butters. Seeds. The items listed above may not be a complete list of recommended foods or beverages. Contact your dietitian for more options.  WHAT FOODS ARE NOT RECOMMENDED? Palm oil and coconut oil. Processed foods. Fried foods. Sweetened drinks, such as sweet tea, milkshakes, snow cones, iced sweet drinks, and sodas. Alcohol. Sweets. Foods that contain a lot of salt or sodium. The items listed above may not be a complete list of foods and beverages  to avoid. Contact your dietitian for more information.   This information is not intended to replace advice given to you by your health care provider. Make sure you discuss any questions you have with your health care provider.   Document Released: 01/29/2015 Document Reviewed: 01/29/2015 Elsevier Interactive Patient Education [employer]

## 2015-07-08 NOTE — Assessment & Plan Note (Addendum)
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 Last plt count 52. We'll recheck CBC today along with other labs as noted above. Return for follow-up in 4-6 weeks.

## 2015-07-08 NOTE — Progress Notes (Signed)
CC'D TO PCP °

## 2015-07-08 NOTE — Progress Notes (Signed)
CT imaging report received from [facility]. CT of the abdomen and pelvis without contrast was completed on 03/25/2015. Impressions include cirrhotic liver with associated small volume of abdominal ascites and changes of portal hypertension. We'll scan report and the system.

## 2015-07-08 NOTE — Assessment & Plan Note (Signed)
Patient with cirrhosis per PCP note, however no copy of imaging had been included. Previous abdominal ultrasound in our system didn't 05/18/2012 found in homogenous enlarged fatty infiltration liver along with splenomegaly at 15.2 cm with a 606 mL volume. Ulcer with thrombus cytopenia as noted below.  Today we will order an initial labs and confirmatory imaging occluding CBC, CMP, iron, ferritin, PTT/INR, antimicrosomal meal antibody-kidney/liver, ANA, anti-smooth muscle antibody, AFP, and ultrasound elastography. We will also have the patient sign a release for a copy of the CT of the abdomen/pelvis report. Return for follow-up in 4-6 weeks. Acute hepatitis panel previous he done in 2013 was negative.

## 2015-07-09 ENCOUNTER — Other Ambulatory Visit (HOSPITAL_COMMUNITY)
Admission: RE | Admit: 2015-07-09 | Discharge: 2015-07-09 | Disposition: A | Discharge status: Home / Self Care | Payer: Medicare Other | Source: Ambulatory Visit | Attending: Nurse Practitioner | Admitting: Nurse Practitioner

## 2015-07-09 DIAGNOSIS — K7460 Unspecified cirrhosis of liver: Secondary | ICD-10-CM | POA: Diagnosis present

## 2015-07-09 LAB — CBC WITH DIFFERENTIAL/PLATELET
Basophils Absolute: 0 10*3/uL (ref 0.0–0.1)
Basophils Relative: 0 %
EOS ABS: 0.1 10*3/uL (ref 0.0–0.7)
EOS PCT: 2 %
HCT: 32.4 % — ABNORMAL LOW (ref 39.0–52.0)
Hemoglobin: 10.8 g/dL — ABNORMAL LOW (ref 13.0–17.0)
LYMPHS ABS: 1 10*3/uL (ref 0.7–4.0)
Lymphocytes Relative: 20 %
MCH: 32.1 pg (ref 26.0–34.0)
MCHC: 33.3 g/dL (ref 30.0–36.0)
MCV: 96.4 fL (ref 78.0–100.0)
MONO ABS: 0.5 10*3/uL (ref 0.1–1.0)
MONOS PCT: 10 %
NEUTROS PCT: 68 %
Neutro Abs: 3.4 10*3/uL (ref 1.7–7.7)
PLATELETS: 69 10*3/uL — AB (ref 150–400)
RBC: 3.36 MIL/uL — AB (ref 4.22–5.81)
RDW: 14.6 % (ref 11.5–15.5)
WBC: 5 10*3/uL (ref 4.0–10.5)

## 2015-07-09 LAB — COMPREHENSIVE METABOLIC PANEL
ALT: 27 U/L (ref 17–63)
ANION GAP: 5 (ref 5–15)
AST: 47 U/L — ABNORMAL HIGH (ref 15–41)
Albumin: 3.3 g/dL — ABNORMAL LOW (ref 3.5–5.0)
Alkaline Phosphatase: 114 U/L (ref 38–126)
BUN: 37 mg/dL — ABNORMAL HIGH (ref 6–20)
CHLORIDE: 110 mmol/L (ref 101–111)
CO2: 25 mmol/L (ref 22–32)
Calcium: 8.5 mg/dL — ABNORMAL LOW (ref 8.9–10.3)
Creatinine, Ser: 2.79 mg/dL — ABNORMAL HIGH (ref 0.61–1.24)
GFR calc non Af Amer: 22 mL/min — ABNORMAL LOW (ref >60)
GFR, EST AFRICAN AMERICAN: 26 mL/min — AB (ref >60)
Glucose, Bld: 99 mg/dL (ref 65–99)
POTASSIUM: 5.1 mmol/L (ref 3.5–5.1)
SODIUM: 140 mmol/L (ref 135–145)
Total Bilirubin: 1.3 mg/dL — ABNORMAL HIGH (ref 0.3–1.2)
Total Protein: 7 g/dL (ref 6.5–8.1)

## 2015-07-09 LAB — IRON AND TIBC
IRON: 64 ug/dL (ref 45–182)
SATURATION RATIOS: 22 % (ref 17.9–39.5)
TIBC: 297 ug/dL (ref 250–450)
UIBC: 233 ug/dL

## 2015-07-09 LAB — PROTIME-INR
INR: 1.33 (ref 0.00–1.49)
PROTHROMBIN TIME: 16.6 s — AB (ref 11.6–15.2)

## 2015-07-09 LAB — FERRITIN: Ferritin: 90 ng/mL (ref 24–336)

## 2015-07-10 LAB — AFP TUMOR MARKER: AFP TUMOR MARKER: 3 ng/mL (ref 0.0–8.3)

## 2015-07-10 LAB — ANTINUCLEAR ANTIBODIES, IFA: ANTINUCLEAR ANTIBODIES, IFA: NEGATIVE

## 2015-07-10 LAB — ANTI-SMOOTH MUSCLE ANTIBODY, IGG: F-Actin IgG: 21 Units — ABNORMAL HIGH (ref 0–19)

## 2015-07-11 ENCOUNTER — Ambulatory Visit (HOSPITAL_COMMUNITY)
Admission: RE | Admit: 2015-07-11 | Discharge: 2015-07-11 | Disposition: A | Discharge status: Home / Self Care | Payer: Medicare Other | Source: Ambulatory Visit | Attending: Nurse Practitioner | Admitting: Nurse Practitioner

## 2015-07-11 DIAGNOSIS — K7460 Unspecified cirrhosis of liver: Secondary | ICD-10-CM

## 2015-07-11 DIAGNOSIS — R161 Splenomegaly, not elsewhere classified: Secondary | ICD-10-CM | POA: Insufficient documentation

## 2015-07-11 DIAGNOSIS — B1920 Unspecified viral hepatitis C without hepatic coma: Secondary | ICD-10-CM | POA: Diagnosis not present

## 2015-07-11 DIAGNOSIS — R188 Other ascites: Secondary | ICD-10-CM | POA: Diagnosis not present

## 2015-07-11 LAB — ANTI-MICROSOMAL ANTIBODY LIVER / KIDNEY: LKM1 Ab: 3.2 Units (ref 0.0–20.0)

## 2015-07-15 ENCOUNTER — Telehealth: Payer: Self-pay

## 2015-07-15 NOTE — Telephone Encounter (Signed)
Pt wife called they are aware of US results. Pt is wanting to know about labs. Wife states that pt is passing bright red blood and having stomach pain

## 2015-07-16 NOTE — Telephone Encounter (Signed)
Please tell the patient his labs results are back. For some reason they never went to the results box. His Hgb is stable with the labs his PCP provided us. His liver function tests appear to be stable from what his PCP provided us. His kidney function has gotten a little worse. We will send these results to his PCP for him to follow up on. Keep with the plan for follow-up. Call with any changes or problems.

## 2015-07-17 NOTE — Telephone Encounter (Signed)
Called pt. No answer. Unable to leave message.  

## 2015-07-18 NOTE — Telephone Encounter (Signed)
Pt is aware.  

## 2015-07-18 NOTE — Telephone Encounter (Signed)
Spoke with wife. She is aware of results and faxed results to PCP

## 2015-08-12 ENCOUNTER — Ambulatory Visit: Payer: Medicare Other | Admitting: Nurse Practitioner

## 2015-08-15 ENCOUNTER — Emergency Department (HOSPITAL_COMMUNITY): Payer: Medicare Other

## 2015-08-15 ENCOUNTER — Inpatient Hospital Stay (HOSPITAL_COMMUNITY)
Admission: EM | Admit: 2015-08-15 | Discharge: 2015-08-19 | DRG: 433 | Disposition: A | Discharge status: Home / Self Care | Payer: Medicare Other | Attending: Internal Medicine | Admitting: Internal Medicine

## 2015-08-15 ENCOUNTER — Encounter (HOSPITAL_COMMUNITY): Payer: Self-pay

## 2015-08-15 DIAGNOSIS — J9801 Acute bronchospasm: Secondary | ICD-10-CM

## 2015-08-15 DIAGNOSIS — E7800 Pure hypercholesterolemia, unspecified: Secondary | ICD-10-CM | POA: Diagnosis present

## 2015-08-15 DIAGNOSIS — J449 Chronic obstructive pulmonary disease, unspecified: Secondary | ICD-10-CM | POA: Diagnosis present

## 2015-08-15 DIAGNOSIS — D61818 Other pancytopenia: Secondary | ICD-10-CM | POA: Diagnosis present

## 2015-08-15 DIAGNOSIS — N184 Chronic kidney disease, stage 4 (severe): Secondary | ICD-10-CM | POA: Diagnosis present

## 2015-08-15 DIAGNOSIS — R400 Somnolence: Secondary | ICD-10-CM | POA: Diagnosis not present

## 2015-08-15 DIAGNOSIS — R509 Fever, unspecified: Secondary | ICD-10-CM | POA: Diagnosis present

## 2015-08-15 DIAGNOSIS — R188 Other ascites: Secondary | ICD-10-CM | POA: Diagnosis present

## 2015-08-15 DIAGNOSIS — N189 Chronic kidney disease, unspecified: Secondary | ICD-10-CM

## 2015-08-15 DIAGNOSIS — Z794 Long term (current) use of insulin: Secondary | ICD-10-CM

## 2015-08-15 DIAGNOSIS — I129 Hypertensive chronic kidney disease with stage 1 through stage 4 chronic kidney disease, or unspecified chronic kidney disease: Secondary | ICD-10-CM | POA: Diagnosis present

## 2015-08-15 DIAGNOSIS — D696 Thrombocytopenia, unspecified: Secondary | ICD-10-CM

## 2015-08-15 DIAGNOSIS — R162 Hepatomegaly with splenomegaly, not elsewhere classified: Secondary | ICD-10-CM | POA: Diagnosis present

## 2015-08-15 DIAGNOSIS — K7460 Unspecified cirrhosis of liver: Principal | ICD-10-CM | POA: Diagnosis present

## 2015-08-15 DIAGNOSIS — K7031 Alcoholic cirrhosis of liver with ascites: Secondary | ICD-10-CM | POA: Diagnosis not present

## 2015-08-15 DIAGNOSIS — D72819 Decreased white blood cell count, unspecified: Secondary | ICD-10-CM

## 2015-08-15 DIAGNOSIS — Z87891 Personal history of nicotine dependence: Secondary | ICD-10-CM

## 2015-08-15 DIAGNOSIS — E039 Hypothyroidism, unspecified: Secondary | ICD-10-CM | POA: Diagnosis present

## 2015-08-15 DIAGNOSIS — Z79891 Long term (current) use of opiate analgesic: Secondary | ICD-10-CM | POA: Diagnosis not present

## 2015-08-15 DIAGNOSIS — E1165 Type 2 diabetes mellitus with hyperglycemia: Secondary | ICD-10-CM

## 2015-08-15 DIAGNOSIS — R109 Unspecified abdominal pain: Secondary | ICD-10-CM | POA: Diagnosis not present

## 2015-08-15 DIAGNOSIS — Z21 Asymptomatic human immunodeficiency virus [HIV] infection status: Secondary | ICD-10-CM | POA: Diagnosis not present

## 2015-08-15 DIAGNOSIS — E669 Obesity, unspecified: Secondary | ICD-10-CM | POA: Diagnosis present

## 2015-08-15 DIAGNOSIS — R112 Nausea with vomiting, unspecified: Secondary | ICD-10-CM | POA: Diagnosis present

## 2015-08-15 DIAGNOSIS — E1122 Type 2 diabetes mellitus with diabetic chronic kidney disease: Secondary | ICD-10-CM | POA: Diagnosis present

## 2015-08-15 DIAGNOSIS — N179 Acute kidney failure, unspecified: Secondary | ICD-10-CM | POA: Diagnosis not present

## 2015-08-15 DIAGNOSIS — IMO0002 Reserved for concepts with insufficient information to code with codable children: Secondary | ICD-10-CM | POA: Diagnosis present

## 2015-08-15 DIAGNOSIS — N289 Disorder of kidney and ureter, unspecified: Secondary | ICD-10-CM | POA: Insufficient documentation

## 2015-08-15 HISTORY — DX: Thrombocytopenia, unspecified: D69.6

## 2015-08-15 HISTORY — DX: Unspecified cirrhosis of liver: K74.60

## 2015-08-15 HISTORY — DX: Splenomegaly, not elsewhere classified: R16.1

## 2015-08-15 HISTORY — DX: Chronic obstructive pulmonary disease, unspecified: J44.9

## 2015-08-15 HISTORY — DX: Portal hypertension: K76.6

## 2015-08-15 LAB — CBC WITH DIFFERENTIAL/PLATELET
Basophils Absolute: 0 10*3/uL (ref 0.0–0.1)
Basophils Relative: 1 %
EOS ABS: 0 10*3/uL (ref 0.0–0.7)
Eosinophils Relative: 1 %
HCT: 30.7 % — ABNORMAL LOW (ref 39.0–52.0)
Hemoglobin: 10.2 g/dL — ABNORMAL LOW (ref 13.0–17.0)
Lymphocytes Relative: 31 %
Lymphs Abs: 0.5 10*3/uL — ABNORMAL LOW (ref 0.7–4.0)
MCH: 32 pg (ref 26.0–34.0)
MCHC: 33.2 g/dL (ref 30.0–36.0)
MCV: 96.2 fL (ref 78.0–100.0)
Monocytes Absolute: 0.3 10*3/uL (ref 0.1–1.0)
Monocytes Relative: 18 %
NEUTROS ABS: 0.9 10*3/uL — AB (ref 1.7–7.7)
NEUTROS PCT: 49 %
PLATELETS: 47 10*3/uL — AB (ref 150–400)
RBC: 3.19 MIL/uL — AB (ref 4.22–5.81)
RDW: 13.4 % (ref 11.5–15.5)
WBC: 1.7 10*3/uL — ABNORMAL LOW (ref 4.0–10.5)

## 2015-08-15 LAB — ETHANOL: Alcohol, Ethyl (B): <5 mg/dL (ref <5)

## 2015-08-15 LAB — COMPREHENSIVE METABOLIC PANEL
ALT: 29 U/L (ref 17–63)
ANION GAP: 6 (ref 5–15)
AST: 57 U/L — ABNORMAL HIGH (ref 15–41)
Albumin: 3.1 g/dL — ABNORMAL LOW (ref 3.5–5.0)
Alkaline Phosphatase: 108 U/L (ref 38–126)
BUN: 29 mg/dL — ABNORMAL HIGH (ref 6–20)
CALCIUM: 8.7 mg/dL — AB (ref 8.9–10.3)
CHLORIDE: 107 mmol/L (ref 101–111)
CO2: 23 mmol/L (ref 22–32)
CREATININE: 2.8 mg/dL — AB (ref 0.61–1.24)
GFR, EST AFRICAN AMERICAN: 26 mL/min — AB (ref >60)
GFR, EST NON AFRICAN AMERICAN: 22 mL/min — AB (ref >60)
Glucose, Bld: 167 mg/dL — ABNORMAL HIGH (ref 65–99)
Potassium: 4 mmol/L (ref 3.5–5.1)
Sodium: 136 mmol/L (ref 135–145)
Total Bilirubin: 1 mg/dL (ref 0.3–1.2)
Total Protein: 6.6 g/dL (ref 6.5–8.1)

## 2015-08-15 LAB — PROTIME-INR
INR: 1.39 (ref 0.00–1.49)
PROTHROMBIN TIME: 17.2 s — AB (ref 11.6–15.2)

## 2015-08-15 LAB — PROTEIN, BODY FLUID: Total protein, fluid: <3 g/dL

## 2015-08-15 LAB — LIPASE, BLOOD: Lipase: 33 U/L (ref 11–51)

## 2015-08-15 LAB — GRAM STAIN

## 2015-08-15 LAB — ALBUMIN, FLUID (OTHER)

## 2015-08-15 LAB — LACTATE DEHYDROGENASE, PLEURAL OR PERITONEAL FLUID: LD, Fluid: 42 U/L — ABNORMAL HIGH (ref 3–23)

## 2015-08-15 LAB — GLUCOSE, PERITONEAL FLUID: GLUCOSE, PERITONEAL FLUID: 180 mg/dL

## 2015-08-15 LAB — GLUCOSE, CAPILLARY: GLUCOSE-CAPILLARY: 126 mg/dL — AB (ref 65–99)

## 2015-08-15 MED ORDER — ONDANSETRON HCL 4 MG PO TABS: 4.0000 mg | ORAL_TABLET | Freq: Four times a day (QID) | ORAL | Status: DC | PRN

## 2015-08-15 MED ORDER — ALBUTEROL SULFATE (2.5 MG/3ML) 0.083% IN NEBU: 2.5000 mg | INHALATION_SOLUTION | RESPIRATORY_TRACT | Status: AC | PRN

## 2015-08-15 MED ORDER — ONDANSETRON HCL 4 MG/2ML IJ SOLN
4.0000 mg | Freq: Four times a day (QID) | INTRAMUSCULAR | Status: DC | PRN
  Administered 2015-08-17: 4 mg via INTRAVENOUS
  Filled 2015-08-15: qty 2

## 2015-08-15 MED ORDER — DEXTROSE 5 % IV SOLN
2.0000 g | INTRAVENOUS | Status: DC
  Administered 2015-08-15: 2 g via INTRAVENOUS
  Filled 2015-08-15 (×4): qty 2

## 2015-08-15 MED ORDER — FUROSEMIDE 20 MG PO TABS: 20.0000 mg | ORAL_TABLET | Freq: Every day | ORAL | Status: DC

## 2015-08-15 MED ORDER — DEXTROSE 5 % IV SOLN
INTRAVENOUS | Status: AC
  Filled 2015-08-15: qty 2

## 2015-08-15 MED ORDER — ALBUTEROL SULFATE (2.5 MG/3ML) 0.083% IN NEBU
2.5000 mg | INHALATION_SOLUTION | Freq: Once | RESPIRATORY_TRACT | Status: AC
  Administered 2015-08-15: 2.5 mg via RESPIRATORY_TRACT
  Filled 2015-08-15: qty 3

## 2015-08-15 MED ORDER — TIZANIDINE HCL 4 MG PO TABS
4.0000 mg | ORAL_TABLET | Freq: Every day | ORAL | Status: DC
  Administered 2015-08-15 – 2015-08-18 (×4): 4 mg via ORAL
  Filled 2015-08-15 (×4): qty 1

## 2015-08-15 MED ORDER — SODIUM CHLORIDE 0.9 % IV SOLN
INTRAVENOUS | Status: DC
  Administered 2015-08-15: 17:00:00 via INTRAVENOUS

## 2015-08-15 MED ORDER — PANTOPRAZOLE SODIUM 40 MG PO TBEC: 40.0000 mg | DELAYED_RELEASE_TABLET | Freq: Every day | ORAL | Status: DC

## 2015-08-15 MED ORDER — INSULIN ASPART 100 UNIT/ML ~~LOC~~ SOLN
0.0000 [IU] | Freq: Three times a day (TID) | SUBCUTANEOUS | Status: DC
  Administered 2015-08-16 – 2015-08-18 (×3): 1 [IU] via SUBCUTANEOUS
  Administered 2015-08-18: 2 [IU] via SUBCUTANEOUS
  Administered 2015-08-18 – 2015-08-19 (×2): 1 [IU] via SUBCUTANEOUS

## 2015-08-15 MED ORDER — MAGNESIUM OXIDE 400 (241.3 MG) MG PO TABS
400.0000 mg | ORAL_TABLET | Freq: Two times a day (BID) | ORAL | Status: DC
  Administered 2015-08-15 – 2015-08-19 (×8): 400 mg via ORAL
  Filled 2015-08-15 (×8): qty 1

## 2015-08-15 MED ORDER — LEVOTHYROXINE SODIUM 75 MCG PO TABS
150.0000 ug | ORAL_TABLET | Freq: Every day | ORAL | Status: DC
  Administered 2015-08-16 – 2015-08-19 (×4): 150 ug via ORAL
  Filled 2015-08-15 (×4): qty 2

## 2015-08-15 MED ORDER — TAMSULOSIN HCL 0.4 MG PO CAPS
0.4000 mg | ORAL_CAPSULE | Freq: Every day | ORAL | Status: DC
  Administered 2015-08-15 – 2015-08-18 (×4): 0.4 mg via ORAL
  Filled 2015-08-15 (×4): qty 1

## 2015-08-15 MED ORDER — FUROSEMIDE 20 MG PO TABS
20.0000 mg | ORAL_TABLET | Freq: Every day | ORAL | Status: DC
  Administered 2015-08-16 – 2015-08-17 (×2): 20 mg via ORAL
  Filled 2015-08-15 (×2): qty 1

## 2015-08-15 MED ORDER — ROPINIROLE HCL 0.25 MG PO TABS
0.5000 mg | ORAL_TABLET | Freq: Every day | ORAL | Status: DC
  Administered 2015-08-15 – 2015-08-18 (×4): 0.5 mg via ORAL
  Filled 2015-08-15 (×2): qty 2
  Filled 2015-08-15 (×4): qty 1

## 2015-08-15 MED ORDER — DEXTROSE 5 % IV SOLN: 1.0000 g | INTRAVENOUS | Status: DC

## 2015-08-15 MED ORDER — ROPINIROLE HCL 0.25 MG PO TABS
ORAL_TABLET | ORAL | Status: AC
  Filled 2015-08-15: qty 2

## 2015-08-15 MED ORDER — GABAPENTIN 400 MG PO CAPS
800.0000 mg | ORAL_CAPSULE | Freq: Two times a day (BID) | ORAL | Status: DC
  Administered 2015-08-15 – 2015-08-19 (×8): 800 mg via ORAL
  Filled 2015-08-15 (×8): qty 2

## 2015-08-15 MED ORDER — PANTOPRAZOLE SODIUM 40 MG PO TBEC
40.0000 mg | DELAYED_RELEASE_TABLET | Freq: Every day | ORAL | Status: DC
  Administered 2015-08-16 – 2015-08-17 (×2): 40 mg via ORAL
  Filled 2015-08-15 (×2): qty 1

## 2015-08-15 MED ORDER — IPRATROPIUM-ALBUTEROL 0.5-2.5 (3) MG/3ML IN SOLN
3.0000 mL | Freq: Once | RESPIRATORY_TRACT | Status: AC
  Administered 2015-08-15: 3 mL via RESPIRATORY_TRACT
  Filled 2015-08-15: qty 3

## 2015-08-15 MED ORDER — SODIUM CHLORIDE 0.9 % IV SOLN: INTRAVENOUS | Status: DC

## 2015-08-15 MED ORDER — ALPRAZOLAM 1 MG PO TABS
1.0000 mg | ORAL_TABLET | Freq: Every evening | ORAL | Status: DC | PRN
Start: 2015-08-15 — End: 2015-08-17
  Administered 2015-08-16: 1 mg via ORAL
  Filled 2015-08-15: qty 1

## 2015-08-15 MED ORDER — OXYCODONE HCL 5 MG PO TABS
10.0000 mg | ORAL_TABLET | ORAL | Status: DC
  Administered 2015-08-15 – 2015-08-17 (×10): 10 mg via ORAL
  Filled 2015-08-15 (×10): qty 2

## 2015-08-15 MED ORDER — INSULIN GLARGINE 100 UNIT/ML ~~LOC~~ SOLN
50.0000 [IU] | Freq: Every day | SUBCUTANEOUS | Status: DC
  Administered 2015-08-15 – 2015-08-16 (×2): 50 [IU] via SUBCUTANEOUS
  Filled 2015-08-15 (×4): qty 0.5

## 2015-08-15 MED ORDER — INSULIN ASPART 100 UNIT/ML ~~LOC~~ SOLN: 0.0000 [IU] | Freq: Every day | SUBCUTANEOUS | Status: DC

## 2015-08-15 NOTE — ED Notes (Addendum)
3 syringes = 180cc & 4 L of body fluid from thoracic cavity drainage total 4180cc. Catheter removed from pt at this time and pressure dressing applied. PT denies any complaints at this time and alert and oriented.

## 2015-08-15 NOTE — ED Notes (Signed)
Informed US that pt was available at this time.
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
## 2015-08-15 NOTE — Procedures (Signed)
PreOperative Dx: Cirrhosis, generalized abdominal pain, ascites Postoperative Dx: Cirrhosis, generalized abdominal pain, ascites Procedure:   US guided paracentesis Radiologist:  Tyron RussellBoles Anesthesia:  10 ml of 1% lidocaine Specimen:  4180 ml of yellow ascitic fluid EBL:   < 1 ml Complications: None

## 2015-08-15 NOTE — ED Notes (Signed)
Pt states he is unable to urinate at this time.

## 2015-08-15 NOTE — ED Notes (Signed)
Report given to Verlon AuLeslie, RN for room 323.
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
## 2015-08-15 NOTE — ED Notes (Signed)
PT in ultrasound at this time with Dyanne Ihaebecca Augusto Deckman, RN present with ultrasound tech Helmut MusterAlicia and Dr. Gilman ButtnerBowls to complete US paracentesis.

## 2015-08-15 NOTE — ED Notes (Signed)
Family reports pt has history of cirrhosis.  Reports abd swelling and fever for the past 2 days.  Pt reports n/v/d and abd pain.

## 2015-08-15 NOTE — ED Notes (Signed)
Consent for procedure completed by Dr. Tyron RussellBoles and pt and timeout performed at this time. Procedure started at 1730.

## 2015-08-16 DIAGNOSIS — N289 Disorder of kidney and ureter, unspecified: Secondary | ICD-10-CM

## 2015-08-16 DIAGNOSIS — R509 Fever, unspecified: Secondary | ICD-10-CM

## 2015-08-16 DIAGNOSIS — D696 Thrombocytopenia, unspecified: Secondary | ICD-10-CM

## 2015-08-16 DIAGNOSIS — K7460 Unspecified cirrhosis of liver: Principal | ICD-10-CM

## 2015-08-16 DIAGNOSIS — R162 Hepatomegaly with splenomegaly, not elsewhere classified: Secondary | ICD-10-CM

## 2015-08-16 DIAGNOSIS — R109 Unspecified abdominal pain: Secondary | ICD-10-CM

## 2015-08-16 LAB — PROTIME-INR
INR: 1.49 (ref 0.00–1.49)
PROTHROMBIN TIME: 18.1 s — AB (ref 11.6–15.2)

## 2015-08-16 LAB — URINE MICROSCOPIC-ADD ON
BACTERIA UA: NONE SEEN
SQUAMOUS EPITHELIAL / LPF: NONE SEEN
WBC, UA: NONE SEEN WBC/hpf (ref 0–5)

## 2015-08-16 LAB — CBC
HCT: 29.5 % — ABNORMAL LOW (ref 39.0–52.0)
HEMOGLOBIN: 9.9 g/dL — AB (ref 13.0–17.0)
MCH: 32.5 pg (ref 26.0–34.0)
MCHC: 33.6 g/dL (ref 30.0–36.0)
MCV: 96.7 fL (ref 78.0–100.0)
Platelets: 49 10*3/uL — ABNORMAL LOW (ref 150–400)
RBC: 3.05 MIL/uL — AB (ref 4.22–5.81)
RDW: 13.5 % (ref 11.5–15.5)
WBC: 3 10*3/uL — ABNORMAL LOW (ref 4.0–10.5)

## 2015-08-16 LAB — COMPREHENSIVE METABOLIC PANEL
ALBUMIN: 2.7 g/dL — AB (ref 3.5–5.0)
ALT: 25 U/L (ref 17–63)
ANION GAP: 9 (ref 5–15)
AST: 50 U/L — AB (ref 15–41)
Alkaline Phosphatase: 89 U/L (ref 38–126)
BILIRUBIN TOTAL: 0.8 mg/dL (ref 0.3–1.2)
BUN: 28 mg/dL — AB (ref 6–20)
CHLORIDE: 103 mmol/L (ref 101–111)
CO2: 25 mmol/L (ref 22–32)
Calcium: 8.3 mg/dL — ABNORMAL LOW (ref 8.9–10.3)
Creatinine, Ser: 2.8 mg/dL — ABNORMAL HIGH (ref 0.61–1.24)
GFR calc Af Amer: 26 mL/min — ABNORMAL LOW (ref >60)
GFR calc non Af Amer: 22 mL/min — ABNORMAL LOW (ref >60)
GLUCOSE: 104 mg/dL — AB (ref 65–99)
POTASSIUM: 4.1 mmol/L (ref 3.5–5.1)
SODIUM: 137 mmol/L (ref 135–145)
TOTAL PROTEIN: 6.2 g/dL — AB (ref 6.5–8.1)

## 2015-08-16 LAB — URINALYSIS, ROUTINE W REFLEX MICROSCOPIC
BILIRUBIN URINE: NEGATIVE
Glucose, UA: NEGATIVE mg/dL
KETONES UR: NEGATIVE mg/dL
Leukocytes, UA: NEGATIVE
NITRITE: NEGATIVE
Specific Gravity, Urine: 1.025 (ref 1.005–1.030)
pH: 5.5 (ref 5.0–8.0)

## 2015-08-16 LAB — BODY FLUID CELL COUNT WITH DIFFERENTIAL
Eos, Fluid: 0 %
LYMPHS FL: 91 %
MONOCYTE-MACROPHAGE-SEROUS FLUID: 9 % — AB (ref 50–90)
NEUTROPHIL FLUID: 0 % (ref 0–25)
Other Cells, Fluid: 0 %
WBC FLUID: 49 uL (ref 0–1000)

## 2015-08-16 LAB — GLUCOSE, CAPILLARY
GLUCOSE-CAPILLARY: 81 mg/dL (ref 65–99)
Glucose-Capillary: 130 mg/dL — ABNORMAL HIGH (ref 65–99)
Glucose-Capillary: 135 mg/dL — ABNORMAL HIGH (ref 65–99)
Glucose-Capillary: 141 mg/dL — ABNORMAL HIGH (ref 65–99)

## 2015-08-16 MED ORDER — ALBUTEROL SULFATE (2.5 MG/3ML) 0.083% IN NEBU
2.5000 mg | INHALATION_SOLUTION | RESPIRATORY_TRACT | Status: DC | PRN
  Administered 2015-08-18: 2.5 mg via RESPIRATORY_TRACT
  Filled 2015-08-16: qty 3

## 2015-08-16 NOTE — Progress Notes (Signed)
PROGRESS NOTE    [name] HouseholderRoy L [name] ZOX:[id number] DOB: 04-Jun-1951 DOA: 08/15/2015 PCP: Caryl NeverJones, [name], PA-C  HPI/Brief narrative 64 year old male patient with history of DM 2, HTN, HLD, cirrhosis with splenomegaly, portal hypertension, thrombocytopenia, COPD presented to the [facility] with 2-3 days history of abdominal distention, pain, diarrhea, vomiting and fever of 102.96F. He reported some cough and wheezing but denied chest pain or palpitations. No symptoms related to GI bleed. In the ED, labs revealed pancytopenia, creatinine 2.8, abdominal ultrasound showed cirrhosis, moderate ascites and splenomegaly, CT chest without acute findings. Patient underwent therapeutic ultrasound-guided paracentesis of 4180 mL of ascitic fluid and was empirically placed on treatment for SBP and admitted for further evaluation and management. GI consulted.    Assessment/Plan:  Cirrhosis with ascites - As per GI consultation, recently diagnosed. - Status post 4 L ultrasound-guided paracentesis on 11/17. Ascitic fluid not indicative of SBP. Empirically started on Rocephin discontinued. - As per GI, will need EGD as outpatient for variceal screening & needs liver transplant evaluation as outpatient. - Continue Lasix.  Fever - Unclear etiology. SBP less likely. Ascitic fluid culture negative to date. Chest x-ray not suggestive of pneumonia and patient has no cough. Has been afebrile since hospitalization. No blood cultures were sent on admission. - Monitor off of antibiotics.  Diarrhea - Apparently has been having intermittent diarrhea as outpatient. No BM since admission. As per GI, if further diarrhea then obtain GI pathogen panel.  Acute renal failure versus chronic kidney disease - Last known creatinine in system prior to this admission was 1.03 on 05/19/2012 - Admitted with creatinine of 2.80. Remains stable at 2.8 today. Not sure if he has gradually progressed since 2013 to stage IV chronic  kidney disease. - Nephrology consultation.   Pancytopenia - May be related to cirrhosis. No bleeding reported. Follow CBCs.  Type II DM with renal complications - Reasonable inpatient control. Continue Lantus and SSI.  Hypothyroid - Tinea Synthroid.  HIV antibody positive in 2013 - But quantitative testing was negative. Repeat quantitative testing pending.  Essential hypertension - Controlled    DVT prophylaxis: SCDs Code Status: Full Family Communication: None at bedside Disposition Plan: EC home possibly in the next 1-2 days   Consultants:  Gastroenterology  Procedures:  Ultrasound-guided therapeutic paracentesis 11/17  Antibiotics:  IV Rocephin-discontinued   Subjective: Feels much better. Abdominal distention decreased and denies pain. No other complaints reported.  Objective: Filed Vitals:   08/15/15 1845 08/16/15 0455 08/16/15 1116 08/16/15 1335  BP: 160/90 109/56  139/69  Pulse: 99 88  82  Temp: 97.7 F (36.5 C) 98.8 F (37.1 C)  98.1 F (36.7 C)  TempSrc: Oral Oral  Oral  Resp: 18   18  Height: 5\' 9"  (1.753 m)     Weight: 102.5 kg (225 lb 15.5 oz)  103.103 kg (227 lb 4.8 oz)   SpO2: 100% 99%  99%    Intake/Output Summary (Last 24 hours) at 08/16/15 1643 Last data filed at 08/16/15 1236  Gross per 24 hour  Intake 1367.5 ml  Output    200 ml  Net 1167.5 ml   Filed Weights   08/15/15 1246 08/15/15 1845 08/16/15 1116  Weight: 105.688 kg (233 lb) 102.5 kg (225 lb 15.5 oz) 103.103 kg (227 lb 4.8 oz)     Exam:  General exam: Pleasant middle-aged male lying comfortably supine in bed. Respiratory system: Clear. No increased work of breathing. Cardiovascular system: S1 & S2 heard, RRR. No JVD, murmurs,  gallops, clicks. Trace bilateral leg edema. Gastrointestinal system: Abdomen is mildly distended, soft and nontender. Ascites +. Normal bowel sounds heard. Central nervous system: Alert and oriented. No focal neurological deficits. Extremities:  Symmetric 5 x 5 power.   Data Reviewed: Basic Metabolic Panel:  Recent Labs Lab 08/15/15 1347 08/16/15 0619  NA 136 137  K 4.0 4.1  CL 107 103  CO2 23 25  GLUCOSE 167* 104*  BUN 29* 28*  CREATININE 2.80* 2.80*  CALCIUM 8.7* 8.3*   Liver Function Tests:  Recent Labs Lab 08/15/15 1347 08/16/15 0619  AST 57* 50*  ALT 29 25  ALKPHOS 108 89  BILITOT 1.0 0.8  PROT 6.6 6.2*  ALBUMIN 3.1* 2.7*    Recent Labs Lab 08/15/15 1346  LIPASE 33   No results for input(s): AMMONIA in the last 168 hours. CBC:  Recent Labs Lab 08/15/15 1347 08/16/15 0619  WBC 1.7* 3.0*  NEUTROABS 0.9*  --   HGB 10.2* 9.9*  HCT 30.7* 29.5*  MCV 96.2 96.7  PLT 47* 49*   Cardiac Enzymes: No results for input(s): CKTOTAL, CKMB, CKMBINDEX, TROPONINI in the last 168 hours. BNP (last 3 results) No results for input(s): PROBNP in the last 8760 hours. CBG:  Recent Labs Lab 08/15/15 2055 08/16/15 0739 08/16/15 1118 08/16/15 1619  GLUCAP 126* 81 130* 135*    Recent Results (from the past 240 hour(s))  Culture, body fluid-bottle     Status: None (Preliminary result)   Collection Time: 08/15/15  5:22 PM  Result Value Ref Range Status   Specimen Description FLUID PERITONEAL COLLECTED BY DOCTOR BOLES  Final   Special Requests BOTTLES DRAWN AEROBIC AND ANAEROBIC 10CC EACH  Final   Culture NO GROWTH < 12 HOURS  Final   Report Status PENDING  Incomplete  Gram stain     Status: None   Collection Time: 08/15/15  5:22 PM  Result Value Ref Range Status   Specimen Description FLUID ASCITIC COLLECTED BY DOCTOR BOLES  Final   Special Requests NONE  Final   Gram Stain   Final    CYTOSPIN SMEAR WBC PRESENT, PREDOMINANTLY MONONUCLEAR NO ORGANISMS SEEN Performed at [facility]    Report Status 08/15/2015 FINAL  Final         Studies: US Abdomen Complete  08/15/2015  CLINICAL DATA:  64 year old male with abdominal pain, nausea vomiting and abdominal distention for 2 days. EXAM:  ULTRASOUND ABDOMEN COMPLETE COMPARISON:  07/11/2015 and prior studies FINDINGS: Gallbladder: Gallbladder wall thickening is noted -likely related to ascites/ hepatic dysfunction. There is no evidence of cholelithiasis or sonographic Murphy sign. Common bile duct: Diameter: 4.3 cm scratch a 4.3 mm. There is no evidence of intrahepatic or extrahepatic biliary dilatation. Liver: Cirrhosis identified. No definite focal hepatic abnormalities noted. IVC: No abnormality visualized. Pancreas: Visualized portion unremarkable. Spleen: Splenomegaly identified with a volume of 1,384 cc. Right Kidney: Length: 10.7 cm. Echogenicity within normal limits. No mass or hydronephrosis visualized. Left Kidney: Length: 11.9 cm. Echogenicity within normal limits. No mass or hydronephrosis visualized. Abdominal aorta: No aneurysm visualized. Other findings: A moderate amount of ascites is identified. IMPRESSION: Moderate ascites. Cirrhosis and moderate splenomegaly. Gallbladder wall thickening without other signs of acute cholecystitis -likely related to ascites/hepatic dysfunction. Electronically Signed   By: Harmon Pier M.D.   On: 08/15/2015 16:30   US Paracentesis  08/15/2015  CLINICAL DATA:  Cirrhosis, ascites, abdominal pain, question spontaneous bacterial peritonitis EXAM: ULTRASOUND GUIDED DIAGNOSTIC AND THERAPEUTIC PARACENTESIS COMPARISON:  Abdominal ultrasound  08/15/2015 PROCEDURE: Procedure, benefits, and risks of procedure were discussed with patient. Written informed consent for procedure was obtained. Time out protocol followed. Adequate collection of ascites localized by ultrasound in LEFT lower quadrant. Skin prepped and draped in usual sterile fashion. Skin and soft tissues anesthetized with 10 mL of 1% lidocaine. 5 [place] Yueh catheter placed into peritoneal cavity. 4180 mL of yellow fluid aspirated by vacuum bottle suction. Procedure tolerated well by patient without immediate complication. COMPLICATIONS: None  FINDINGS: A total of approximately 4180 mL of ascitic fluid was removed. A fluid sample of 180 mL was sent for laboratory analysis. IMPRESSION: Successful ultrasound guided paracentesis yielding 4180 mL of ascites. Electronically Signed   By: Ulyses Southward M.D.   On: 08/15/2015 18:16   Dg Abd Acute W/chest  08/15/2015  CLINICAL DATA:  Wheezing with abdominal pain. Abdominal distention for 2 days. EXAM: DG ABDOMEN ACUTE W/ 1V CHEST COMPARISON:  Chest radiograph 03/25/2015 and abdominal CT 03/25/2015 FINDINGS: Prominent interstitial lung markings bilaterally. These interstitial findings are more prominent but may be related to lower lung volumes. Heart size is within normal limits. Trachea is midline. Negative for free air. There is gas in the stomach and colon. Nonobstructive bowel gas pattern. No large abdominal calcifications. IMPRESSION: Prominent interstitial lung markings probably represent chronic changes with volume loss. Cannot exclude mild interstitial edema. Normal abdominal radiographs. Electronically Signed   By: Richarda Overlie M.D.   On: 08/15/2015 16:31        Scheduled Meds: . furosemide  20 mg Oral Daily  . gabapentin  800 mg Oral BID  . insulin aspart  0-5 Units Subcutaneous QHS  . insulin aspart  0-9 Units Subcutaneous TID WC  . insulin glargine  50 Units Subcutaneous QHS  . levothyroxine  150 mcg Oral QAC breakfast  . magnesium oxide  400 mg Oral BID  . oxyCODONE  10 mg Oral 6 times per day  . pantoprazole  40 mg Oral Daily  . rOPINIRole  0.5 mg Oral QHS  . tamsulosin  0.4 mg Oral QHS  . tiZANidine  4 mg Oral QHS   Continuous Infusions: . sodium chloride 75 mL/hr at 08/15/15 1700    Active Problems:   Diabetes mellitus type 2, uncontrolled (HCC)   HIV test positive (HCC)   Hepatosplenomegaly   Cirrhosis of liver with ascites (HCC)   Fever   Abdominal pain in male   Renal insufficiency    Time spent: 30 minutes    Ajiah Mcglinn, MD, FACP, FHM. [facility] Pager [phone]  If 7PM-7AM, please contact night-coverage [email or web address] Password [email or web address] 08/16/2015, 4:43 PM    LOS: 1 day

## 2015-08-16 NOTE — Plan of Care (Signed)
Problem: Pain Managment: Goal: General experience of comfort will improve Outcome: Progressing Patient receiving scheduled pain medication for pain management
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
## 2015-08-16 NOTE — Care Management Important Message (Signed)
Important Message  Patient Details  Name: Darl HouseholderRoy L Oswald MRN: [id number] Date of Birth: 1951/08/03   Medicare Important Message Given:  Yes    Cheryl FlashBlackwell, Jeannine Pennisi Crowder, RN 08/16/2015, 9:08 AM

## 2015-08-16 NOTE — Care Management Note (Signed)
Case Management Note  Patient Details  Name: [name] [name] MRN: [id number] Date of Birth: 21-Apr-1951  Subjective/Objective:                  Pt admitted from home with ascites, abd pain. Pt lives with his wife and grandchildren. Pt stated that he uses a cane for ambulation. Pt has a shower chair for home use. Pt still drives.  Action/Plan: No Cm needs anticipated.  Expected Discharge Date:                  Expected Discharge Plan:  Home/Self Care  In-House Referral:  NA  Discharge planning Services  CM Consult  Post Acute Care Choice:  NA Choice offered to:  NA  DME Arranged:    DME Agency:     HH Arranged:    HH Agency:     Status of Service:  Completed, signed off  Medicare Important Message Given:  Yes Date Medicare IM Given:    Medicare IM give by:    Date Additional Medicare IM Given:    Additional Medicare Important Message give by:     If discussed at Long Length of Stay Meetings, dates discussed:    Additional Comments:  Cheryl FlashBlackwell, [place] Free Crowder, RN 08/16/2015, 9:09 AM

## 2015-08-17 DIAGNOSIS — R112 Nausea with vomiting, unspecified: Secondary | ICD-10-CM

## 2015-08-17 DIAGNOSIS — R400 Somnolence: Secondary | ICD-10-CM

## 2015-08-17 DIAGNOSIS — N179 Acute kidney failure, unspecified: Secondary | ICD-10-CM

## 2015-08-17 LAB — BASIC METABOLIC PANEL
ANION GAP: 5 (ref 5–15)
BUN: 28 mg/dL — ABNORMAL HIGH (ref 6–20)
CALCIUM: 8.1 mg/dL — AB (ref 8.9–10.3)
CHLORIDE: 103 mmol/L (ref 101–111)
CO2: 25 mmol/L (ref 22–32)
Creatinine, Ser: 2.75 mg/dL — ABNORMAL HIGH (ref 0.61–1.24)
GFR calc non Af Amer: 22 mL/min — ABNORMAL LOW (ref >60)
GFR, EST AFRICAN AMERICAN: 26 mL/min — AB (ref >60)
Glucose, Bld: 112 mg/dL — ABNORMAL HIGH (ref 65–99)
Potassium: 4.3 mmol/L (ref 3.5–5.1)
Sodium: 133 mmol/L — ABNORMAL LOW (ref 135–145)

## 2015-08-17 LAB — SODIUM, URINE, RANDOM: SODIUM UR: 102 mmol/L

## 2015-08-17 LAB — GLUCOSE, CAPILLARY
GLUCOSE-CAPILLARY: 93 mg/dL (ref 65–99)
Glucose-Capillary: 89 mg/dL (ref 65–99)
Glucose-Capillary: 96 mg/dL (ref 65–99)
Glucose-Capillary: 95 mg/dL (ref 65–99)

## 2015-08-17 LAB — CBC
HEMATOCRIT: 28.1 % — AB (ref 39.0–52.0)
HEMOGLOBIN: 9.8 g/dL — AB (ref 13.0–17.0)
MCH: 32.5 pg (ref 26.0–34.0)
MCHC: 34.9 g/dL (ref 30.0–36.0)
MCV: 93 fL (ref 78.0–100.0)
Platelets: 47 10*3/uL — ABNORMAL LOW (ref 150–400)
RBC: 3.02 MIL/uL — ABNORMAL LOW (ref 4.22–5.81)
RDW: 13.9 % (ref 11.5–15.5)
WBC: 3.8 10*3/uL — ABNORMAL LOW (ref 4.0–10.5)

## 2015-08-17 LAB — AMMONIA: Ammonia: 54 umol/L — ABNORMAL HIGH (ref 9–35)

## 2015-08-17 LAB — CREATININE, URINE, RANDOM: CREATININE, URINE: 76.33 mg/dL

## 2015-08-17 MED ORDER — INSULIN GLARGINE 100 UNIT/ML ~~LOC~~ SOLN
20.0000 [IU] | Freq: Every day | SUBCUTANEOUS | Status: DC
  Administered 2015-08-18: 20 [IU] via SUBCUTANEOUS
  Filled 2015-08-17 (×3): qty 0.2

## 2015-08-17 MED ORDER — INSULIN GLARGINE 100 UNIT/ML ~~LOC~~ SOLN
40.0000 [IU] | Freq: Every day | SUBCUTANEOUS | Status: DC
  Filled 2015-08-17 (×2): qty 0.4

## 2015-08-17 MED ORDER — SODIUM CHLORIDE 0.9 % IV SOLN
INTRAVENOUS | Status: DC
  Administered 2015-08-17 – 2015-08-18 (×3): via INTRAVENOUS

## 2015-08-17 MED ORDER — PROMETHAZINE HCL 25 MG/ML IJ SOLN
12.5000 mg | Freq: Four times a day (QID) | INTRAMUSCULAR | Status: DC | PRN
  Filled 2015-08-17: qty 1

## 2015-08-17 MED ORDER — PANTOPRAZOLE SODIUM 40 MG IV SOLR
40.0000 mg | Freq: Two times a day (BID) | INTRAVENOUS | Status: DC
  Administered 2015-08-17 – 2015-08-19 (×4): 40 mg via INTRAVENOUS
  Filled 2015-08-17 (×4): qty 40

## 2015-08-17 NOTE — Progress Notes (Signed)
PROGRESS NOTE    [name] [name] ZOX:[id number] DOB: January 09, 1951 DOA: 08/15/2015 PCP: Caryl Never  HPI/Brief narrative 64 year old male patient with history of DM 2, HTN, HLD, cirrhosis with splenomegaly, portal hypertension, thrombocytopenia, COPD presented to the [facility] with 2-3 days history of abdominal distention, pain, diarrhea, vomiting and fever of 102.69F. He reported some cough and wheezing but denied chest pain or palpitations. No symptoms related to GI bleed. In the ED, labs revealed pancytopenia, creatinine 2.8, abdominal ultrasound showed cirrhosis, moderate ascites and splenomegaly, CT chest without acute findings. Patient underwent therapeutic ultrasound-guided paracentesis of 4180 mL of ascitic fluid and was empirically placed on treatment for SBP and admitted for further evaluation and management. GI consulted.  Assessment/Plan:  Cirrhosis with ascites - As per GI consultation, recently diagnosed. - Status post 4 L ultrasound-guided paracentesis on 11/17. Ascitic fluid not indicative of SBP. Empirically started on Rocephin discontinued. - As per GI, will need EGD as outpatient for variceal screening & needs liver transplant evaluation as outpatient. - Lasix to be held while patient on IV fluids for possible acute kidney injury.  Fever - Unclear etiology. SBP less likely. Ascitic fluid culture negative to date. Chest x-ray not suggestive of pneumonia and patient has no cough. Has been afebrile since hospitalization. No blood cultures were sent on admission. - Monitor off of antibiotics. - No further fevers.  Diarrhea/nausea and vomiting - Apparently has been having intermittent diarrhea as outpatient. No BM since admission. As per GI, if further diarrhea then obtain GI pathogen panel. - No further diarrhea. However since this afternoon, patient has had an episode of nonbloody emesis. Change Protonix to IV, continue Zofran when necessary and add Phenergan  for refractory nausea and vomiting. Unclear etiology.  Sedation/drowsiness - When seen this morning, patient was slightly somnolent but alert enough to give good history and was sitting at edge of bed. Subsequently during the late morning, he became more somnolent. Likely secondary to Xanax he received last night and scheduled opioids-discontinued both. - As per family, patient takes half of the Xanax sometimes at night for sleep and the pain pills up to 2 times daily. - Discussed with nursing. Monitor closely. Continuous pulse oximetry. Telemetry. Avoid sedative medications. Ammonia level not high enough as etiology for this.  Acute renal failure versus chronic kidney disease - Last known creatinine in system prior to this admission was 1.03 on 05/19/2012 - Admitted with creatinine of 2.80. Remains stable at 2.8 today. Not sure if he has gradually progressed since 2013 to stage IV chronic kidney disease. - Nephrology consultation appreciated: Not sure whether this is acute renal failure superimposed on chronic renal failure versus natural progression of chronic renal failure. Started normal saline hydration and checking urine sodium and creatinine.  - Follow BMP.  Pancytopenia - May be related to cirrhosis. No bleeding reported. Stable.  Type II DM with renal complications - Reasonable inpatient control. Continue Lantus-dosed reduced due to very tight control and now nothing by mouth due to sedation and SSI.  Hypothyroid Continued Synthroid.  HIV antibody positive in 2013 - But quantitative testing was negative. Repeat quantitative testing pending.  Essential hypertension - Fluctuating and mildly uncontrolled     DVT prophylaxis: SCDs Code Status: Full Family Communication: discussed with patient's spouse at bedside this morning and spouse and extended family at bedside this afternoon Disposition Plan: DC home possibly in the next 1-2  days   Consultants:  Gastroenterology  Procedures:  Ultrasound-guided therapeutic paracentesis  11/17  Antibiotics:  IV Rocephin-discontinued   Subjective: Did not have complaints when seen this morning. However during the course of the day became somnolent and had 1 episode of nonbloody emesis.   Objective: Filed Vitals:   08/16/15 2250 08/17/15 0600 08/17/15 0657 08/17/15 1255  BP: 178/87  114/76 162/84  Pulse: 90  86 91  Temp: 98 F (36.7 C)  98 F (36.7 C)   TempSrc: Oral  Oral   Resp: Height:      Weight:   102.921 kg (226 lb 14.4 oz)   SpO2: 99% 98% 95% 98%    Intake/Output Summary (Last 24 hours) at 08/17/15 1738 Last data filed at 08/17/15 1200  Gross per 24 hour  Intake    600 ml  Output      0 ml  Net    600 ml   Filed Weights   08/15/15 1845 08/16/15 1116 08/17/15 0657  Weight: 102.5 kg (225 lb 15.5 oz) 103.103 kg (227 lb 4.8 oz) 102.921 kg (226 lb 14.4 oz)     Exam:  General exam: Pleasant middle-aged male lying comfortably supine in bed. Respiratory system: Clear. No increased work of breathing. Cardiovascular system: S1 & S2 heard, RRR. No JVD, murmurs, gallops, clicks. Trace bilateral leg edema. Gastrointestinal system: Abdomen is mildly distended, soft and nontender. Ascites +. Normal bowel sounds heard. Central nervous system: Somnolent this afternoon but easily arousable, opens eyes, oriented to self and then drifts back to sleep. No focal neurological deficits. Extremities: Symmetric 5 x 5 power.   Data Reviewed: Basic Metabolic Panel:  Recent Labs Lab 08/15/15 1347 08/16/15 0619 08/17/15 0625  NA 136 137 133*  K 4.0 4.1 4.3  CL 107 103 103  CO2 GLUCOSE 167* 104* 112*  BUN 29* 28* 28*  CREATININE 2.80* 2.80* 2.75*  CALCIUM 8.7* 8.3* 8.1*   Liver Function Tests:  Recent Labs Lab 08/15/15 1347 08/16/15 0619  AST 57* 50*  ALT 29 25  ALKPHOS 108 89  BILITOT 1.0 0.8  PROT 6.6 6.2*  ALBUMIN 3.1* 2.7*     Recent Labs Lab 08/15/15 1346  LIPASE 33    Recent Labs Lab 08/17/15 1330  AMMONIA 54*   CBC:  Recent Labs Lab 08/15/15 1347 08/16/15 0619 08/17/15 0625  WBC 1.7* 3.0* 3.8*  NEUTROABS 0.9*  --   --   HGB 10.2* 9.9* 9.8*  HCT 30.7* 29.5* 28.1*  MCV 96.2 96.7 93.0  PLT 47* 49* 47*   Cardiac Enzymes: No results for input(s): CKTOTAL, CKMB, CKMBINDEX, TROPONINI in the last 168 hours. BNP (last 3 results) No results for input(s): PROBNP in the last 8760 hours. CBG:  Recent Labs Lab 08/16/15 1619 08/16/15 2016 08/17/15 0720 08/17/15 1107 08/17/15 1612  GLUCAP 135* 141* 89 93 96    Recent Results (from the past 240 hour(s))  Culture, body fluid-bottle     Status: None (Preliminary result)   Collection Time: 08/15/15  5:22 PM  Result Value Ref Range Status   Specimen Description FLUID PERITONEAL COLLECTED BY DOCTOR BOLES  Final   Special Requests BOTTLES DRAWN AEROBIC AND ANAEROBIC 10CC EACH  Final   Culture NO GROWTH 2 DAYS  Final   Report Status PENDING  Incomplete  Gram stain     Status: None   Collection Time: 08/15/15  5:22 PM  Result Value Ref Range Status   Specimen Description FLUID ASCITIC COLLECTED BY DOCTOR BOLES  Final  Special Requests NONE  Final   Gram Stain   Final    CYTOSPIN SMEAR WBC PRESENT, PREDOMINANTLY MONONUCLEAR NO ORGANISMS SEEN Performed at [facility]    Report Status 08/15/2015 FINAL  Final  Urine culture     Status: None (Preliminary result)   Collection Time: 08/16/15  8:47 AM  Result Value Ref Range Status   Specimen Description URINE, RANDOM  Final   Special Requests NONE  Final   Culture   Final    NO GROWTH < 24 HOURS Performed at [facility]    Report Status PENDING  Incomplete         Studies: [place] Paracentesis  08/15/2015  CLINICAL DATA:  Cirrhosis, ascites, abdominal pain, question spontaneous bacterial peritonitis EXAM: ULTRASOUND GUIDED DIAGNOSTIC AND THERAPEUTIC PARACENTESIS  COMPARISON:  Abdominal ultrasound 08/15/2015 PROCEDURE: Procedure, benefits, and risks of procedure were discussed with patient. Written informed consent for procedure was obtained. Time out protocol followed. Adequate collection of ascites localized by ultrasound in LEFT lower quadrant. Skin prepped and draped in usual sterile fashion. Skin and soft tissues anesthetized with 10 mL of 1% lidocaine. 5 [place] catheter placed into peritoneal cavity. 4180 mL of yellow fluid aspirated by vacuum bottle suction. Procedure tolerated well by patient without immediate complication. COMPLICATIONS: None FINDINGS: A total of approximately 4180 mL of ascitic fluid was removed. A fluid sample of 180 mL was sent for laboratory analysis. IMPRESSION: Successful ultrasound guided paracentesis yielding 4180 mL of ascites. Electronically Signed   By: Ulyses SouthwardMark  Boles M.D.   On: 08/15/2015 18:16        Scheduled Meds: . furosemide  20 mg Oral Daily  . gabapentin  800 mg Oral BID  . insulin aspart  0-9 Units Subcutaneous TID WC  . insulin glargine  40 Units Subcutaneous QHS  . levothyroxine  150 mcg Oral QAC breakfast  . magnesium oxide  400 mg Oral BID  . pantoprazole (PROTONIX) IV  40 mg Intravenous Q12H  . rOPINIRole  0.5 mg Oral QHS  . tamsulosin  0.4 mg Oral QHS  . tiZANidine  4 mg Oral QHS   Continuous Infusions: . sodium chloride 100 mL/hr at 08/17/15 1048    Active Problems:   Diabetes mellitus type 2, uncontrolled (HCC)   HIV test positive (HCC)   Hepatosplenomegaly   Cirrhosis of liver with ascites (HCC)   Fever   Abdominal pain in male   Renal insufficiency    Time spent: 30 minutes    Moani Weipert, MD, FACP, FHM. [facility] Pager [phone]  If 7PM-7AM, please contact night-coverage [email or web address] Password [email or web address] 08/17/2015, 5:38 PM    LOS: 2 days
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
## 2015-08-17 NOTE — Progress Notes (Signed)
Patient had episode of nausea. Zofran was given. Patient is now actively vomiting. MD notified/aware. MD came to see patient and orders were placed. Patient is now NPO with meds. Narcotics are discontinued. Phenergan is scheduled as needed. No oral intake until patient is awake and alert. RN will continue to monitor patient and vitals. Lesly Dukesachel J Everett, RN

## 2015-08-17 NOTE — Progress Notes (Signed)
Patient was lethargic and slept most of the morning. Patient received at Xanax last night at 2300. Pt is arousable to voice and touch. O2 was 96%. Attempted to get patient to urinate but not successful. MD aware/notified. MD ordered Cardiac monitoring, continuous pulse ox, and medication modifications.  RN will continue to monitor. Lesly Dukesachel J Everett, RN

## 2015-08-18 DIAGNOSIS — N189 Chronic kidney disease, unspecified: Secondary | ICD-10-CM

## 2015-08-18 DIAGNOSIS — D61818 Other pancytopenia: Secondary | ICD-10-CM

## 2015-08-18 LAB — CBC
HEMATOCRIT: 28.8 % — AB (ref 39.0–52.0)
HEMOGLOBIN: 10.1 g/dL — AB (ref 13.0–17.0)
MCH: 32.6 pg (ref 26.0–34.0)
MCHC: 35.1 g/dL (ref 30.0–36.0)
MCV: 92.9 fL (ref 78.0–100.0)
Platelets: 49 10*3/uL — ABNORMAL LOW (ref 150–400)
RBC: 3.1 MIL/uL — ABNORMAL LOW (ref 4.22–5.81)
RDW: 13.9 % (ref 11.5–15.5)
WBC: 4.8 10*3/uL (ref 4.0–10.5)

## 2015-08-18 LAB — FERRITIN: Ferritin: 109 ng/mL (ref 24–336)

## 2015-08-18 LAB — BASIC METABOLIC PANEL
ANION GAP: 7 (ref 5–15)
BUN: 29 mg/dL — ABNORMAL HIGH (ref 6–20)
CALCIUM: 8.3 mg/dL — AB (ref 8.9–10.3)
CO2: 26 mmol/L (ref 22–32)
Chloride: 102 mmol/L (ref 101–111)
Creatinine, Ser: 2.59 mg/dL — ABNORMAL HIGH (ref 0.61–1.24)
GFR calc Af Amer: 28 mL/min — ABNORMAL LOW (ref >60)
GFR, EST NON AFRICAN AMERICAN: 24 mL/min — AB (ref >60)
GLUCOSE: 152 mg/dL — AB (ref 65–99)
POTASSIUM: 4.8 mmol/L (ref 3.5–5.1)
SODIUM: 135 mmol/L (ref 135–145)

## 2015-08-18 LAB — GLUCOSE, CAPILLARY
GLUCOSE-CAPILLARY: 135 mg/dL — AB (ref 65–99)
GLUCOSE-CAPILLARY: 140 mg/dL — AB (ref 65–99)
GLUCOSE-CAPILLARY: 150 mg/dL — AB (ref 65–99)
Glucose-Capillary: 129 mg/dL — ABNORMAL HIGH (ref 65–99)

## 2015-08-18 LAB — HIV-1 RNA QUANT-NO REFLEX-BLD
HIV 1 RNA Quant: <20 copies/mL
LOG10 HIV-1 RNA: UNDETERMINED log10copy/mL

## 2015-08-18 LAB — URINE CULTURE: CULTURE: NO GROWTH

## 2015-08-18 LAB — IRON AND TIBC
Iron: 70 ug/dL (ref 45–182)
SATURATION RATIOS: 30 % (ref 17.9–39.5)
TIBC: 234 ug/dL — ABNORMAL LOW (ref 250–450)
UIBC: 164 ug/dL

## 2015-08-18 LAB — PHOSPHORUS: Phosphorus: 3.2 mg/dL (ref 2.5–4.6)

## 2015-08-18 MED ORDER — ALPRAZOLAM 0.25 MG PO TABS: 0.2500 mg | ORAL_TABLET | Freq: Every evening | ORAL | Status: DC | PRN

## 2015-08-18 MED ORDER — ROPINIROLE HCL 0.25 MG PO TABS
ORAL_TABLET | ORAL | Status: AC
  Filled 2015-08-18: qty 1

## 2015-08-18 MED ORDER — OXYCODONE HCL 5 MG PO TABS: 5.0000 mg | ORAL_TABLET | Freq: Two times a day (BID) | ORAL | Status: DC | PRN

## 2015-08-18 NOTE — Progress Notes (Signed)
Subjective: Patient offers no complaints. Is feeling much better. Patient also denies any difficulty in breathing.    Objective: Vital signs in last 24 hours: Temp:  [98.1 F (36.7 C)-98.9 F (37.2 C)] 98.9 F (37.2 C) (11/20 0614) Pulse Rate:  [87-97] 87 (11/20 0614) Resp:  [18-20] 20 (11/20 0614) BP: (135-162)/(74-84) 135/74 mmHg (11/20 0614) SpO2:  [94 %-99 %] 97 % (11/20 0614) Weight:  [226 lb 3.1 oz (102.6 kg)] 226 lb 3.1 oz (102.6 kg) (11/20 16100614)  Intake/Output from previous day: 11/19 0701 - 11/20 0700 In: 2290 [P.O.:360; I.V.:1930] Out: 900 [Urine:900] Intake/Output this shift:     Recent Labs  08/15/15 1347 08/16/15 0619 08/17/15 0625 08/18/15 0606  HGB 10.2* 9.9* 9.8* 10.1*    Recent Labs  08/17/15 0625 08/18/15 0606  WBC 3.8* 4.8  RBC 3.02* 3.10*  HCT 28.1* 28.8*  PLT 47* 49*    Recent Labs  08/17/15 0625 08/18/15 0606  NA 133* 135  K 4.3 4.8  CL 103 102  CO2 25 26  BUN 28* 29*  CREATININE 2.75* 2.59*  GLUCOSE 112* 152*  CALCIUM 8.1* 8.3*    Recent Labs  08/15/15 1346 08/16/15 0619  INR 1.39 1.49    Patient is alert and in no apparent distress. Chest is clear to auscultation Heart examination regular rate and rhythm Abdomen: Distended, nontender. Extremities he has 1+ edema  Assessment/Plan: Problem #1 acute kidney injury superimposed on chronic. Presently etiology thought to be secondary to ATN/prerenal. His BUN and creatinine is improving. Patient is nonoliguric with 900 mL of urine output. Problem #2 liver cirrhosis: Patient with ascites Problem #3 history of diabetes Problem #4 anemia: His hemoglobin and hematocrit is stable. Iron studies is pending. Problem #5 metabolic bone disease: His calcium and phosphorus is in range. Problem #6 history of chronic renal failure: Stage II. Problem #7 hypertension: His blood pressure is reasonably controlled   problem #8 history of hypothyroidism Plan: We'll check his compressive  metabolic panel in the morning. 2] We will continue with hydration.   Chaitra Mast S 08/18/2015, 9:56 AM
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
## 2015-08-18 NOTE — Progress Notes (Signed)
Patient has a blood sugar of 96. Scheduled to get 20 units of lantus. Contacted the midlevel to see if we could either reduce the dose or hold the dose of lantus tonight. Midlevel states it is okay to hold lantus tonight. Lantus will be held.

## 2015-08-19 ENCOUNTER — Telehealth: Payer: Self-pay | Admitting: Gastroenterology

## 2015-08-19 ENCOUNTER — Other Ambulatory Visit: Payer: Self-pay

## 2015-08-19 ENCOUNTER — Other Ambulatory Visit (HOSPITAL_COMMUNITY)
Admission: RE | Admit: 2015-08-19 | Discharge: 2015-08-19 | Disposition: A | Discharge status: Home / Self Care | Payer: Medicare Other | Source: Ambulatory Visit | Attending: Nurse Practitioner | Admitting: Nurse Practitioner

## 2015-08-19 DIAGNOSIS — N184 Chronic kidney disease, stage 4 (severe): Secondary | ICD-10-CM

## 2015-08-19 DIAGNOSIS — K7031 Alcoholic cirrhosis of liver with ascites: Secondary | ICD-10-CM

## 2015-08-19 DIAGNOSIS — K7460 Unspecified cirrhosis of liver: Secondary | ICD-10-CM

## 2015-08-19 LAB — GLUCOSE, CAPILLARY
GLUCOSE-CAPILLARY: 106 mg/dL — AB (ref 65–99)
Glucose-Capillary: 140 mg/dL — ABNORMAL HIGH (ref 65–99)

## 2015-08-19 LAB — BASIC METABOLIC PANEL
Anion gap: 5 (ref 5–15)
BUN: 31 mg/dL — AB (ref 6–20)
CHLORIDE: 107 mmol/L (ref 101–111)
CO2: 25 mmol/L (ref 22–32)
Calcium: 7.9 mg/dL — ABNORMAL LOW (ref 8.9–10.3)
Creatinine, Ser: 2.44 mg/dL — ABNORMAL HIGH (ref 0.61–1.24)
GFR calc Af Amer: 30 mL/min — ABNORMAL LOW (ref >60)
GFR calc non Af Amer: 26 mL/min — ABNORMAL LOW (ref >60)
GLUCOSE: 106 mg/dL — AB (ref 65–99)
POTASSIUM: 4.7 mmol/L (ref 3.5–5.1)
Sodium: 137 mmol/L (ref 135–145)

## 2015-08-19 MED ORDER — SPIRONOLACTONE 50 MG PO TABS: 50.0000 mg | ORAL_TABLET | Freq: Every day | ORAL | Status: DC

## 2015-08-19 MED ORDER — ALPRAZOLAM 0.25 MG PO TABS: 0.2500 mg | ORAL_TABLET | Freq: Every evening | ORAL | Status: DC | PRN

## 2015-08-19 MED ORDER — INSULIN GLARGINE 100 UNIT/ML ~~LOC~~ SOLN: 15.0000 [IU] | Freq: Every day | SUBCUTANEOUS | Status: DC

## 2015-08-19 MED ORDER — SPIRONOLACTONE 25 MG PO TABS
50.0000 mg | ORAL_TABLET | Freq: Every day | ORAL | Status: DC
  Administered 2015-08-19: 50 mg via ORAL
  Filled 2015-08-19: qty 2

## 2015-08-19 MED ORDER — OXYCODONE HCL 10 MG PO TABS: 5.0000 mg | ORAL_TABLET | Freq: Two times a day (BID) | ORAL | Status: DC | PRN

## 2015-08-19 MED ORDER — INSULIN ASPART 100 UNIT/ML ~~LOC~~ SOLN: 3.0000 [IU] | Freq: Three times a day (TID) | SUBCUTANEOUS | Status: DC
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
## 2015-08-19 NOTE — Progress Notes (Signed)
Patient with orders to be discharge home. Discharge instructions given, patient and spouse verbalized understanding. Prescriptions given. Patient stable. Patient left in private vehicle with spouse.   

## 2015-08-19 NOTE — Progress Notes (Signed)
    Subjective: No abdominal pain, N/V. 1 BM yesterday. Back to baseline for patient. Change in mental status over the weekend, somnolence, in setting of xanax and scheduled opioids. This has been discontinued with improvement in mental status.   Objective: Vital signs in last 24 hours: Temp:  [97.9 F (36.6 C)-98.3 F (36.8 C)] 97.9 F (36.6 C) (11/21 0641) Pulse Rate:  [69-91] 69 (11/21 0641) Resp:  [20] 20 (11/21 0641) BP: (138-145)/(71-92) 138/75 mmHg (11/21 0641) SpO2:  [98 %-100 %] 98 % (11/21 0641) Weight:  [230 lb 9.6 oz (104.599 kg)] 230 lb 9.6 oz (104.599 kg) (11/21 0641) Last BM Date: 08/17/15 General:   Alert and oriented, pleasant Head:  Normocephalic and atraumatic. Eyes:  No icterus, sclera clear. Conjuctiva pink.  Abdomen:  Bowel sounds present, soft, obese, large AP diameter, difficult to appreciate HSM.  Msk:  Symmetrical without gross deformities. Normal posture. Neurologic:  Alert and  oriented x4 Psych:  Alert and cooperative. Normal mood and affect.  Intake/Output from previous day: 11/20 0701 - 11/21 0700 In: 3181.7 [P.O.:720; I.V.:2461.7] Out: -  Intake/Output this shift:    Lab Results:  Recent Labs  08/17/15 0625 08/18/15 0606  WBC 3.8* 4.8  HGB 9.8* 10.1*  HCT 28.1* 28.8*  PLT 47* 49*   Lab Results  Component Value Date   IRON 70 08/18/2015   TIBC 234* 08/18/2015   FERRITIN 109 08/18/2015    BMET  Recent Labs  08/17/15 0625 08/18/15 0606 08/19/15 0623  NA 133* 135 137  K 4.3 4.8 4.7  CL 103 102 107  CO2 25 26 25   GLUCOSE 112* 152* 106*  BUN 28* 29* 31*  CREATININE 2.75* 2.59* 2.44*  CALCIUM 8.1* 8.3* 7.9*    Assessment: 64 year old male with recent diagnosis of cirrhosis, admitted with fever, N/V/D. Paracentesis performed without evidence for SBP. Clinically improved since admission and now back to baseline. Will sign off and arrange outpatient appointment with our practice. Recommend limiting narcotics, Xanax as much as  possible.   Plan: Will arrange follow-up with our practice as outpatient to arrange EGD for variceal screening. Our office to arrange and contact patient.  Limit narcotics, sedative agents Further cirrhosis care as outpatient Signing off    Nira RetortAnna W. Sams, ANP-BC [facility]    LOS: 4 days    08/19/2015, 10:00 AM

## 2015-08-19 NOTE — Care Management Important Message (Signed)
Important Message  Patient Details  Name: Darl HouseholderRoy L Pardoe MRN: [id number] Date of Birth: [birth year]   Medicare Important Message Given:  Yes    Cheryl FlashBlackwell, Milaya Hora Crowder, RN 08/19/2015, 10:54 AM

## 2015-08-19 NOTE — Telephone Encounter (Signed)
Patient is scheduled with Minerva AreolaEric on 09/02/15. This will serve as his outpatient hospital follow-up appt.

## 2015-08-19 NOTE — Progress Notes (Signed)
Subjective: Patient offers no complaints. His appetite is good and no difficulty in breathing..    Objective: Vital signs in last 24 hours: Temp:  [97.9 F (36.6 C)-98.3 F (36.8 C)] 97.9 F (36.6 C) (11/21 0641) Pulse Rate:  [69-91] 69 (11/21 0641) Resp:  [20] 20 (11/21 0641) BP: (138-145)/(71-92) 138/75 mmHg (11/21 0641) SpO2:  [98 %-100 %] 98 % (11/21 0641) Weight:  [230 lb 9.6 oz (104.599 kg)] 230 lb 9.6 oz (104.599 kg) (11/21 0641)  Intake/Output from previous day: 11/20 0701 - 11/21 0700 In: 3181.7 [P.O.:720; I.V.:2461.7] Out: -  Intake/Output this shift:     Recent Labs  08/17/15 0625 08/18/15 0606  HGB 9.8* 10.1*    Recent Labs  08/17/15 0625 08/18/15 0606  WBC 3.8* 4.8  RBC 3.02* 3.10*  HCT 28.1* 28.8*  PLT 47* 49*    Recent Labs  08/18/15 0606 08/19/15 0623  NA 135 137  K 4.8 4.7  CL 102 107  CO2 26 25  BUN 29* 31*  CREATININE 2.59* 2.44*  GLUCOSE 152* 106*  CALCIUM 8.3* 7.9*   No results for input(s): LABPT, INR in the last 72 hours.  Patient is alert and in no apparent distress. Chest is clear to auscultation Heart examination regular rate and rhythm Abdomen: Distended, nontender. Extremities he has 1+ edema  Assessment/Plan: Problem #1 acute kidney injury superimposed on chronic. Presently etiology thought to be secondary to ATN/prerenal.  Patient is nonoliguric with 900 mL of urine output. His renal function very slowly improving. At this moment no recent blood work to compare hence very difficult to decide whether it has come down to his baseline. Problem #2 liver cirrhosis: Patient with ascites. Patient status post paracentesis. Presently he denies any difficulty in breathing. Problem #3 history of diabetes: His blood sugar is controlled. Problem #4 anemia: His hemoglobin and hematocrit is stable. His iron saturation and ferritin is within normal range. Hence this could be secondary to chronic disease. Problem #5 metabolic bone  disease: His calcium and phosphorus is in range. Problem #6 history of chronic renal failure: Stage II. Problem #7 hypertension: His blood pressure is reasonably controlled   problem #8 history of hypothyroidism Plan: 1] We'll check his compressive metabolic panel in the morning. 2] We will continue with hydration. 3] we'll start patient on Aldactone 50 mg by mouth daily   Aftan Vint S 08/19/2015, 8:09 AM

## 2015-08-19 NOTE — Care Management Note (Signed)
Case Management Note  Patient Details  Name: [name] [name] MRN: [id number] Date of Birth: [birth year]  Subjective/Objective:                    Action/Plan:   Expected Discharge Date:                  Expected Discharge Plan:  Home/Self Care  In-House Referral:  NA  Discharge planning Services  CM Consult  Post Acute Care Choice:  NA Choice offered to:  NA  DME Arranged:    DME Agency:     HH Arranged:    HH Agency:     Status of Service:  Completed, signed off  Medicare Important Message Given:  Yes Date Medicare IM Given:    Medicare IM give by:    Date Additional Medicare IM Given:    Additional Medicare Important Message give by:     If discussed at Long Length of Stay Meetings, dates discussed:    Additional Comments: Pt discharged home today. No CM needs noted. Arlyss QueenBlackwell, Kelina Beauchamp Winnebagorowder, RN 08/19/2015, 10:54 AM

## 2015-08-19 NOTE — Discharge Instructions (Signed)
Ascites Ascites is a collection of excess fluid in the abdomen. Ascites can range from mild to severe. It can get worse without treatment. CAUSES Possible causes include:  Cirrhosis. This is the most common cause of ascites.  Infection or inflammation in the abdomen.  Cancer in the abdomen.  Heart failure.  Kidney disease.  Inflammation of the pancreas.  Clots in the veins of the liver. SIGNS AND SYMPTOMS Signs and symptoms may include:  A feeling of fullness in your abdomen. This is common.  An increase in the size of your abdomen or your waist.  Swelling in your legs.  Swelling of the scrotum in men.  Difficulty breathing.  Abdominal pain.  Sudden weight gain. If the condition is mild, you may not have symptoms. DIAGNOSIS To make a diagnosis, your health care provider will:  Ask about your medical history.  Perform a physical exam.  Order imaging tests, such as an ultrasound or CT scan of your abdomen. TREATMENT Treatment depends on the cause of the ascites. It may include:  Taking a pill to make you urinate. This is called a water pill (diuretic pill).  Strictly reducing your salt (sodium) intake. Salt can cause extra fluid to be kept in the body, and this makes ascites worse.  Having a procedure to remove fluid from your abdomen (paracentesis).  Having a procedure to transfer fluid from your abdomen into a vein.  Having a procedure that connects two of the major veins within your liver and relieves pressure on your liver (TIPS procedure). Ascites may go away or improve with treatment of the condition that caused it.  HOME CARE INSTRUCTIONS  Keep track of your weight. To do this, weigh yourself at the same time every day and record your weight.  Keep track of how much you drink and any changes in the amount you urinate.  Follow any instructions that your health care provider gives you about how much to drink.  Try not to eat salty (high-sodium)  foods.  Take medicines only as directed by your health care provider.  Keep all follow-up visits as directed by your health care provider. This is important.  Report any changes in your health to your health care provider, especially if you develop new symptoms or your symptoms get worse. SEEK MEDICAL CARE IF:  Your gain more than 3 pounds in 3 days.  Your abdominal size or your waist size increases.  You have new swelling in your legs.  The swelling in your legs gets worse. SEEK IMMEDIATE MEDICAL CARE IF:  You develop a fever.  You develop confusion.  You develop new or worsening difficulty breathing.  You develop new or worsening abdominal pain.  You develop new or worsening swelling in the scrotum (in men).   This information is not intended to replace advice given to you by your health care provider. Make sure you discuss any questions you have with your health care provider.   Document Released: 09/14/2005 Document Revised: 10/05/2014 Document Reviewed: 04/13/2014 Elsevier Interactive Patient Education 2016 [employer].  Cirrhosis Cirrhosis is long-term (chronic) liver injury. The liver is your largest internal organ, and it performs many functions. The liver converts food into energy, removes toxic material from your blood, makes important proteins, and absorbs necessary vitamins from your diet. If you have cirrhosis, it means many of your healthy liver cells have been replaced by scar tissue. This prevents blood from flowing through your liver, which makes it difficult for your liver to function.  This scarring is not reversible, but treatment can prevent it from getting worse.  CAUSES  Hepatitis C and long-term alcohol abuse are the most common causes of cirrhosis. Other causes include:  Nonalcoholic fatty liver disease.  Hepatitis B infection.  Autoimmune hepatitis.  Diseases that cause blockage of ducts inside the liver.  Inherited liver  diseases.  Reactions to certain long-term medicines.  Parasitic infections.  Long-term exposure to certain toxins. RISK FACTORS You may have a higher risk of cirrhosis if you:  Have certain hepatitis viruses.  Abuse alcohol, especially if you are male.  Are overweight.  Share needles.  Have unprotected sex with someone who has hepatitis. SYMPTOMS  You may not have any signs and symptoms at first. Symptoms may not develop until the damage to your liver starts to get worse. Signs and symptoms of cirrhosis may include:   Tenderness in the right-upper part of your abdomen.  Weakness and tiredness (fatigue).  Loss of appetite.  Nausea.  Weight loss and muscle loss.  Itchiness.  Yellow skin and eyes (jaundice).  Buildup of fluid in the abdomen (ascites).  Swelling of the feet and ankles (edema).  Appearance of tiny blood vessels under the skin.  Mental confusion.  Easy bruising and bleeding. DIAGNOSIS  Your health care provider may suspect cirrhosis based on your symptoms and medical history, especially if you have other medical conditions or a history of alcohol abuse. Your health care provider will do a physical exam to feel your liver and check for signs of cirrhosis. Your health care provider may perform other tests, including:   Blood tests to check:   Whether you have hepatitis B or C.   Kidney function.  Liver function.  Imaging tests such as:  MRI or CT scan to look for changes seen in advanced cirrhosis.  Ultrasound to see if normal liver tissue is being replaced by scar tissue.  A procedure using a long needle to take a sample of liver tissue (biopsy) for examination under a microscope. Liver biopsy can confirm the diagnosis of cirrhosis.  TREATMENT  Treatment depends on how damaged your liver is and what caused the damage. Treatment may include treating cirrhosis symptoms or treating the underlying causes of the condition to try to slow the  progression of the damage. Treatment may include:  Making lifestyle changes, such as:   Eating a healthy diet.  Restricting salt intake.  Maintaining a healthy weight.   Not abusing drugs or alcohol.  Taking medicines to:  Treat liver infections or other infections.  Control itching.  Reduce fluid buildup.  Reduce certain blood toxins.  Reduce risk of bleeding from enlarged blood vessels in the stomach or esophagus (varices).  If varices are causing bleeding problems, you may need treatment with a procedure that ties up the vessels causing them to fall off (band ligation).  If cirrhosis is causing your liver to fail, your health care provider may recommend a liver transplant.  Other treatments may be recommended depending on any complications of cirrhosis, such as liver-related kidney failure (hepatorenal syndrome). HOME CARE INSTRUCTIONS   Take medicines only as directed by your health care provider. Do not use drugs that are toxic to your liver. Ask your health care provider before taking any new medicines, including over-the-counter medicines.   Rest as needed.  Eat a well-balanced diet. Ask your health care provider or dietitian for more information.   You may have to follow a low-salt diet or restrict your water intake as  directed.  Do not drink alcohol. This is especially important if you are taking acetaminophen.  Keep all follow-up visits as directed by your health care provider. This is important. SEEK MEDICAL CARE IF:  You have fatigue or weakness that is getting worse.  You develop swelling of the hands, feet, legs, or face.  You have a fever.  You develop loss of appetite.  You have nausea or vomiting.  You develop jaundice.  You develop easy bruising or bleeding. SEEK IMMEDIATE MEDICAL CARE IF:  You vomit bright red blood or a material that looks like coffee grounds.  You have blood in your stools.  Your stools appear black and  tarry.  You become confused.  You have chest pain or trouble breathing.   This information is not intended to replace advice given to you by your health care provider. Make sure you discuss any questions you have with your health care provider.   Document Released: 09/14/2005 Document Revised: 10/05/2014 Document Reviewed: 05/23/2014 Elsevier Interactive Patient Education 2016 [employer]  Pain Medicine Instructions  HOW CAN PAIN MEDICINE AFFECT ME?    You were given a prescription for pain medicine. This medicine may make you tired or drowsy and may affect your ability to think clearly. Pain medicine may also affect your ability to drive or perform certain physical activities. It may not be possible to make all of your pain go away, but you should be comfortable enough to move, breathe, and take care of yourself.  HOW OFTEN SHOULD I TAKE PAIN MEDICINE AND HOW MUCH SHOULD I TAKE?  Take pain medicine only as directed by your health care provider and only as needed for pain.  You do not need to take pain medicine if you are not having pain, unless directed by your health care provider.  You can take less than the prescribed dose if you find that a smaller amount of medicine controls your pain. WHAT RESTRICTIONS DO I HAVE WHILE TAKING PAIN MEDICINE?  Follow these instructions after you start taking pain medicine, while you are taking the medicine, and for 8 hours after you stop taking the medicine:  Do not drive.  Do not operate machinery.  Do not operate power tools.  Do not sign legal documents.  Do not drink alcohol.  Do not take sleeping pills.  Do not supervise children by yourself.  Do not participate in activities that require climbing or being in high places.  Do not enter a body of water--such as a lake, river, ocean, spa, or swimming pool--without an adult nearby who can monitor and help you. HOW CAN I KEEP OTHERS SAFE WHILE I AM TAKING PAIN MEDICINE?  Store your pain  medicine as directed by your health care provider. Make sure that it is placed where children and pets cannot reach it.  Never share your pain medicine with anyone.  Do not save any leftover pills. If you have any leftover pain medicine, get rid of it or destroy it as directed by your health care provider. WHAT ELSE DO I NEED TO KNOW ABOUT TAKING PAIN MEDICINE?  Use a stool softener if you become constipated from your pain medicine. Increasing your intake of fruits and vegetables will also help with constipation.  Write down the times when you take your pain medicine. Look at the times before you take your next dose of medicine. It is easy to become confused while on pain medicine. Recording the times helps you to avoid an overdose.  If  your pain is severe, do not try to treat it yourself by taking more pills than instructed on your prescription. Contact your health care provider for help.  You may have been prescribed a pain medicine that contains acetaminophen. Do not take any other acetaminophen while taking this medicine. An overdose of acetaminophen can result in severe liver damage. Acetaminophen is found in many over-the-counter (OTC) and prescription medicines. If you are taking any medicines in addition to your pain medicine, check the active ingredients on those medicines to see if acetaminophen is listed. WHEN SHOULD I CALL MY HEALTH CARE PROVIDER?  Your medicine is not helping to make the pain go away.  You vomit or have diarrhea shortly after taking the medicine.  You develop new pain in areas that did not hurt before.  You have an allergic reaction to your medicine. This may include:  Itchiness.  Swelling.  Dizziness.  Developing a new rash. WHEN SHOULD I CALL 911 OR GO TO THE EMERGENCY ROOM?  You feel dizzy or you faint.  You are very confused or disoriented.  You repeatedly vomit.  Your skin or lips turn pale or bluish in color.  You have shortness of breath or you are breathing  much more slowly than usual.  You have a severe allergic reaction to your medicine. This includes:  Developing tongue swelling.  Having difficulty breathing. This information is not intended to replace advice given to you by your health care provider. Make sure you discuss any questions you have with your health care provider.  Document Released: 12/21/2000 Document Revised: 01/29/2015 Document Reviewed: 07/19/2014  Elsevier Interactive Patient Education [employer]

## 2015-08-20 LAB — CULTURE, BODY FLUID-BOTTLE: CULTURE: NO GROWTH

## 2015-08-20 LAB — HEMOGLOBIN A1C
Hgb A1c MFr Bld: 6.4 % — ABNORMAL HIGH (ref 4.8–5.6)
Mean Plasma Glucose: 137 mg/dL

## 2015-08-20 LAB — CULTURE, BODY FLUID W GRAM STAIN -BOTTLE

## 2015-09-02 ENCOUNTER — Encounter: Payer: Self-pay | Admitting: Nurse Practitioner

## 2015-09-02 ENCOUNTER — Ambulatory Visit (INDEPENDENT_AMBULATORY_CARE_PROVIDER_SITE_OTHER): Payer: Medicare Other | Admitting: Nurse Practitioner

## 2015-09-02 ENCOUNTER — Other Ambulatory Visit: Payer: Self-pay

## 2015-09-02 VITALS — BP 141/81 | HR 92 | Temp 97.0°F | Ht 68.0 in | Wt 231.2 lb

## 2015-09-02 DIAGNOSIS — I8500 Esophageal varices without bleeding: Secondary | ICD-10-CM

## 2015-09-02 DIAGNOSIS — R188 Other ascites: Principal | ICD-10-CM

## 2015-09-02 DIAGNOSIS — D696 Thrombocytopenia, unspecified: Secondary | ICD-10-CM | POA: Diagnosis not present

## 2015-09-02 DIAGNOSIS — K7460 Unspecified cirrhosis of liver: Secondary | ICD-10-CM

## 2015-09-02 DIAGNOSIS — I864 Gastric varices: Principal | ICD-10-CM
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
## 2015-09-02 NOTE — Assessment & Plan Note (Signed)
She was recently diagnosed cirrhosis had been seen by our office 1 time for this. Since then he has been hospitalized with fluid accumulation and ascites in addition to acute likely viral illness including nausea and vomiting. 4 L were drained. Since discharge she has not reaccumulated any of his fluid. Overall he is asymptomatic, has some minor swelling his lower extremities. He is currently on Aldactone 50 mg daily. He has not been screened for esophageal varices at this point. Today are recheck his CBC, CMP, PTT/INR. He has an appointment scheduled with nephrology to follow-up on his kidney function as it was quite poor in the hospital with a creatinine of 2.8, which improved to 2.5 with hydration and renal guidance. Return for follow-up in 6 months. Depending his lab results and subsequent meld and child Pugh score he may need to be [facility].  Proceed with EGD with Dr. Jena Gaussourk in near future: the risks, benefits, and alternatives have been discussed with the patient in detail. The patient states understanding and desires to proceed.  The patient is not on any anticoagulants or chronic pain medications. He is on Xanax 0.25 at bedtime as needed. He is also on Neurontin. No anxiolytics, chronic antidepressants. We'll provide for 12.5 mg of Phenergan preprocedure to promote adequate sedation.

## 2015-09-02 NOTE — Patient Instructions (Addendum)
1. Have your labs drawn when you're able to. 2. We will schedule your procedure (upper endoscopy) for you. 3. Return for follow-up in 6 months. 4. We will call you with your lab results and determine if we need to send you for transplant evaluation 5. Below some information on cirrhosis for you to repeat.    Cirrhosis Cirrhosis is long-term (chronic) liver injury. The liver is your largest internal organ, and it performs many functions. The liver converts food into energy, removes toxic material from your blood, makes important proteins, and absorbs necessary vitamins from your diet. If you have cirrhosis, it means many of your healthy liver cells have been replaced by scar tissue. This prevents blood from flowing through your liver, which makes it difficult for your liver to function. This scarring is not reversible, but treatment can prevent it from getting worse.  CAUSES  Hepatitis C and long-term alcohol abuse are the most common causes of cirrhosis. Other causes include:  Nonalcoholic fatty liver disease.  Hepatitis B infection.  Autoimmune hepatitis.  Diseases that cause blockage of ducts inside the liver.  Inherited liver diseases.  Reactions to certain long-term medicines.  Parasitic infections.  Long-term exposure to certain toxins. RISK FACTORS You may have a higher risk of cirrhosis if you:  Have certain hepatitis viruses.  Abuse alcohol, especially if you are male.  Are overweight.  Share needles.  Have unprotected sex with someone who has hepatitis. SYMPTOMS  You may not have any signs and symptoms at first. Symptoms may not develop until the damage to your liver starts to get worse. Signs and symptoms of cirrhosis may include:   Tenderness in the right-upper part of your abdomen.  Weakness and tiredness (fatigue).  Loss of appetite.  Nausea.  Weight loss and muscle loss.  Itchiness.  Yellow skin and eyes (jaundice).  Buildup of fluid in the  abdomen (ascites).  Swelling of the feet and ankles (edema).  Appearance of tiny blood vessels under the skin.  Mental confusion.  Easy bruising and bleeding. DIAGNOSIS  Your health care provider may suspect cirrhosis based on your symptoms and medical history, especially if you have other medical conditions or a history of alcohol abuse. Your health care provider will do a physical exam to feel your liver and check for signs of cirrhosis. Your health care provider may perform other tests, including:   Blood tests to check:   Whether you have hepatitis B or C.   Kidney function.  Liver function.  Imaging tests such as:  MRI or CT scan to look for changes seen in advanced cirrhosis.  Ultrasound to see if normal liver tissue is being replaced by scar tissue.  A procedure using a long needle to take a sample of liver tissue (biopsy) for examination under a microscope. Liver biopsy can confirm the diagnosis of cirrhosis.  TREATMENT  Treatment depends on how damaged your liver is and what caused the damage. Treatment may include treating cirrhosis symptoms or treating the underlying causes of the condition to try to slow the progression of the damage. Treatment may include:  Making lifestyle changes, such as:   Eating a healthy diet.  Restricting salt intake.  Maintaining a healthy weight.   Not abusing drugs or alcohol.  Taking medicines to:  Treat liver infections or other infections.  Control itching.  Reduce fluid buildup.  Reduce certain blood toxins.  Reduce risk of bleeding from enlarged blood vessels in the stomach or esophagus (varices).  If varices  are causing bleeding problems, you may need treatment with a procedure that ties up the vessels causing them to fall off (band ligation).  If cirrhosis is causing your liver to fail, your health care provider may recommend a liver transplant.  Other treatments may be recommended depending on any  complications of cirrhosis, such as liver-related kidney failure (hepatorenal syndrome). HOME CARE INSTRUCTIONS   Take medicines only as directed by your health care provider. Do not use drugs that are toxic to your liver. Ask your health care provider before taking any new medicines, including over-the-counter medicines.   Rest as needed.  Eat a well-balanced diet. Ask your health care provider or dietitian for more information.   You may have to follow a low-salt diet or restrict your water intake as directed.  Do not drink alcohol. This is especially important if you are taking acetaminophen.  Keep all follow-up visits as directed by your health care provider. This is important. SEEK MEDICAL CARE IF:  You have fatigue or weakness that is getting worse.  You develop swelling of the hands, feet, legs, or face.  You have a fever.  You develop loss of appetite.  You have nausea or vomiting.  You develop jaundice.  You develop easy bruising or bleeding. SEEK IMMEDIATE MEDICAL CARE IF:  You vomit bright red blood or a material that looks like coffee grounds.  You have blood in your stools.  Your stools appear black and tarry.  You become confused.  You have chest pain or trouble breathing.   This information is not intended to replace advice given to you by your health care provider. Make sure you discuss any questions you have with your health care provider.   Document Released: 09/14/2005 Document Revised: 10/05/2014 Document Reviewed: 05/23/2014 Elsevier Interactive Patient Education [employer]

## 2015-09-02 NOTE — Assessment & Plan Note (Signed)
From the cytopenia with splenomegaly. Last but the count 08/18/2015 was 49. We will recheck CBC and evaluate platelet count this time. Return for follow-up in 6 months.

## 2015-09-03 NOTE — Progress Notes (Signed)
cc'ed to pcp °

## 2015-09-04 ENCOUNTER — Encounter (HOSPITAL_COMMUNITY)
Admission: RE | Disposition: A | Discharge status: Home / Self Care | Payer: Self-pay | Source: Ambulatory Visit | Attending: Internal Medicine

## 2015-09-04 ENCOUNTER — Encounter (HOSPITAL_COMMUNITY): Payer: Self-pay | Admitting: *Deleted

## 2015-09-04 ENCOUNTER — Ambulatory Visit (HOSPITAL_COMMUNITY)
Admission: RE | Admit: 2015-09-04 | Discharge: 2015-09-04 | Disposition: A | Discharge status: Home / Self Care | Payer: Medicare Other | Source: Ambulatory Visit | Attending: Internal Medicine | Admitting: Internal Medicine

## 2015-09-04 DIAGNOSIS — E119 Type 2 diabetes mellitus without complications: Secondary | ICD-10-CM | POA: Diagnosis not present

## 2015-09-04 DIAGNOSIS — E079 Disorder of thyroid, unspecified: Secondary | ICD-10-CM | POA: Diagnosis not present

## 2015-09-04 DIAGNOSIS — E7800 Pure hypercholesterolemia, unspecified: Secondary | ICD-10-CM | POA: Insufficient documentation

## 2015-09-04 DIAGNOSIS — K3189 Other diseases of stomach and duodenum: Secondary | ICD-10-CM | POA: Insufficient documentation

## 2015-09-04 DIAGNOSIS — I8500 Esophageal varices without bleeding: Secondary | ICD-10-CM | POA: Insufficient documentation

## 2015-09-04 DIAGNOSIS — K766 Portal hypertension: Secondary | ICD-10-CM

## 2015-09-04 DIAGNOSIS — D696 Thrombocytopenia, unspecified: Secondary | ICD-10-CM | POA: Insufficient documentation

## 2015-09-04 DIAGNOSIS — Z794 Long term (current) use of insulin: Secondary | ICD-10-CM | POA: Insufficient documentation

## 2015-09-04 DIAGNOSIS — I10 Essential (primary) hypertension: Secondary | ICD-10-CM | POA: Diagnosis not present

## 2015-09-04 DIAGNOSIS — Z87891 Personal history of nicotine dependence: Secondary | ICD-10-CM | POA: Insufficient documentation

## 2015-09-04 DIAGNOSIS — Z79899 Other long term (current) drug therapy: Secondary | ICD-10-CM | POA: Diagnosis not present

## 2015-09-04 DIAGNOSIS — I8510 Secondary esophageal varices without bleeding: Secondary | ICD-10-CM | POA: Diagnosis not present

## 2015-09-04 DIAGNOSIS — J449 Chronic obstructive pulmonary disease, unspecified: Secondary | ICD-10-CM | POA: Diagnosis not present

## 2015-09-04 DIAGNOSIS — K7460 Unspecified cirrhosis of liver: Secondary | ICD-10-CM | POA: Insufficient documentation

## 2015-09-04 DIAGNOSIS — I864 Gastric varices: Secondary | ICD-10-CM

## 2015-09-04 HISTORY — DX: Hypothyroidism, unspecified: E03.9

## 2015-09-04 HISTORY — PX: ESOPHAGOGASTRODUODENOSCOPY: SHX5428

## 2015-09-04 LAB — GLUCOSE, CAPILLARY: GLUCOSE-CAPILLARY: 105 mg/dL — AB (ref 65–99)

## 2015-09-04 SURGERY — EGD (ESOPHAGOGASTRODUODENOSCOPY)
Anesthesia: Moderate Sedation

## 2015-09-04 MED ORDER — PROMETHAZINE HCL 25 MG/ML IJ SOLN
INTRAMUSCULAR | Status: AC
  Filled 2015-09-04: qty 1

## 2015-09-04 MED ORDER — ONDANSETRON HCL 4 MG/2ML IJ SOLN
INTRAMUSCULAR | Status: DC | PRN
  Administered 2015-09-04: 4 mg via INTRAVENOUS

## 2015-09-04 MED ORDER — PROMETHAZINE HCL 25 MG/ML IJ SOLN
12.5000 mg | Freq: Once | INTRAMUSCULAR | Status: AC
  Administered 2015-09-04: 12.5 mg via INTRAVENOUS

## 2015-09-04 MED ORDER — ONDANSETRON HCL 4 MG/2ML IJ SOLN
INTRAMUSCULAR | Status: AC
  Filled 2015-09-04: qty 2

## 2015-09-04 MED ORDER — LIDOCAINE VISCOUS 2 % MT SOLN
OROMUCOSAL | Status: AC
  Filled 2015-09-04: qty 15

## 2015-09-04 MED ORDER — MEPERIDINE HCL 100 MG/ML IJ SOLN
INTRAMUSCULAR | Status: AC
  Filled 2015-09-04: qty 2

## 2015-09-04 MED ORDER — SODIUM CHLORIDE 0.9 % IJ SOLN
INTRAMUSCULAR | Status: AC
  Filled 2015-09-04: qty 3

## 2015-09-04 MED ORDER — MIDAZOLAM HCL 5 MG/5ML IJ SOLN
INTRAMUSCULAR | Status: AC
  Filled 2015-09-04: qty 10

## 2015-09-04 MED ORDER — MEPERIDINE HCL 100 MG/ML IJ SOLN
INTRAMUSCULAR | Status: DC | PRN
  Administered 2015-09-04: 25 mg via INTRAVENOUS

## 2015-09-04 MED ORDER — LIDOCAINE VISCOUS 2 % MT SOLN
OROMUCOSAL | Status: DC | PRN
  Administered 2015-09-04: 1 via OROMUCOSAL

## 2015-09-04 MED ORDER — SODIUM CHLORIDE 0.9 % IV SOLN
INTRAVENOUS | Status: DC
  Administered 2015-09-04: 1000 mL via INTRAVENOUS

## 2015-09-04 MED ORDER — SIMETHICONE 40 MG/0.6ML PO SUSP
ORAL | Status: DC | PRN
  Administered 2015-09-04: 09:00:00

## 2015-09-04 MED ORDER — MIDAZOLAM HCL 5 MG/5ML IJ SOLN
INTRAMUSCULAR | Status: DC | PRN
  Administered 2015-09-04: 2 mg via INTRAVENOUS

## 2015-09-04 NOTE — Discharge Instructions (Addendum)
EGD Discharge instructions Please read the instructions outlined below and refer to this sheet in the next few weeks. These discharge instructions provide you with general information on caring for yourself after you leave the hospital. Your doctor may also give you specific instructions. While your treatment has been planned according to the most current medical practices available, unavoidable complications occasionally occur. If you have any problems or questions after discharge, please call your doctor. ACTIVITY You may resume your regular activity but move at a slower pace for the next 24 hours.  Take frequent rest periods for the next 24 hours.  Walking will help expel (get rid of) the air and reduce the bloated feeling in your abdomen.  No driving for 24 hours (because of the anesthesia (medicine) used during the test).  You may shower.  Do not sign any important legal documents or operate any machinery for 24 hours (because of the anesthesia used during the test).  NUTRITION Drink plenty of fluids.  You may resume your normal diet.  Begin with a light meal and progress to your normal diet.  Avoid alcoholic beverages for 24 hours or as instructed by your caregiver.  MEDICATIONS You may resume your normal medications unless your caregiver tells you otherwise.  WHAT YOU CAN EXPECT TODAY You may experience abdominal discomfort such as a feeling of fullness or gas pains.  FOLLOW-UP Your doctor will discuss the results of your test with you.  SEEK IMMEDIATE MEDICAL ATTENTION IF ANY OF THE FOLLOWING OCCUR: Excessive nausea (feeling sick to your stomach) and/or vomiting.  Severe abdominal pain and distention (swelling).  Trouble swallowing.  Temperature over 101 F (37.8 C).  Rectal bleeding or vomiting of blood.    Begin naldolol 40 mg to lower your heart rate to help prevent variceal bleedings; office visit with us in one month
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
## 2015-09-04 NOTE — Op Note (Signed)
[facility] [address] [place], [id number]   ENDOSCOPY PROCEDURE REPORT  PATIENT: Darl HouseholderChurch, [name] L  MR#: [id number] BIRTHDATE: [birth year] , 64  yrs. old GENDER: male ENDOSCOPIST: R.  Roetta SessionsMichael Rourk, MD [facility]FACP FACG REFERRED BY:  Prudy FeelerAngel Jones, PA-C PROCEDURE DATE:  09/04/2015 PROCEDURE:  EGD, screening INDICATIONS:  Cirrhotic; screening examination for varices. MEDICATIONS: Versed 2 mg IV and Demerol 25 mg IV.  Phenergan 12.5 mg IV.  Xylocaine gel orally.  Zofran 4 mg IV. ASA CLASS:      Class III  CONSENT: The risks, benefits, limitations, alternatives and imponderables have been discussed.  The potential for biopsy, esophogeal dilation, etc. have also been reviewed.  Questions have been answered.  All parties agreeable.  Please see the history and physical in the medical record for more information.  DESCRIPTION OF PROCEDURE: After the risks benefits and alternatives of the procedure were thoroughly explained, informed consent was obtained.  The [id number] ([id number] endoscope was introduced through the mouth and advanced to the second portion of the duodenum , limited by Without limitations. The instrument was slowly withdrawn as the mucosa was fully examined. Estimated blood loss is zero unless otherwise noted in this procedure report.    4 columns of grade 2-3 esophageal varices without bleeding stigmata. Overlying mucosa otherwise appeared normal.  Minimal retained gastric contents.  Diffuse fish scaling or snakeskin appearance of the gastric mucosa consistent with portal gastropathy.  No gastric varices or other abnormality observed.  Patent pylorus. Normal-appearing first and second portion of the duodenum. Retroflexed views revealed as previously described.     The scope was then withdrawn from the patient and the procedure completed.  COMPLICATIONS: There were no immediate complications.  ENDOSCOPIC IMPRESSION: Esophageal varices as described. Portal  gastropathy. Retained gastric contents.  RECOMMENDATIONS: Begin Naldolol 40 mg daily?"target heart rate 55. Office visit with us in one month for cirrhosis care.  REPEAT EXAM:  eSigned:  R. Roetta SessionsMichael Rourk, MD Jerrel IvoryFACP [facility] HospitalFACG 09/04/2015 9:08 AM    CC:  CPT CODES: ICD CODES:  The ICD and CPT codes recommended by this software are interpretations from the data that the clinical staff has captured with the software.  The verification of the translation of this report to the ICD and CPT codes and modifiers is the sole responsibility of the health care institution and practicing physician where this report was generated.  [employer] will not be held responsible for the validity of the ICD and CPT codes included on this report.  AMA assumes no liability for data contained or not contained herein. CPT is a Publishing rights managerregistered trademark of the [employer].

## 2015-09-04 NOTE — Interval H&P Note (Signed)
History and Physical Interval Note:  09/04/2015 8:42 AM  [name] [name]  has presented today for surgery, with the diagnosis of SCREENING VARICEAL  The various methods of treatment have been discussed with the patient and family. After consideration of risks, benefits and other options for treatment, the patient has consented to  Procedure(s) with comments: ESOPHAGOGASTRODUODENOSCOPY (EGD) (N/A) - 815 as a surgical intervention .  The patient's history has been reviewed, patient examined, no change in status, stable for surgery.  I have reviewed the patient's chart and labs.  Questions were answered to the patient's satisfaction.     Shams Fill  No change. Screening EGD per plan.The risks, benefits, limitations, alternatives and imponderables have been reviewed with the patient. Questions have been answered. All parties are agreeable.

## 2015-09-09 ENCOUNTER — Encounter (HOSPITAL_COMMUNITY): Payer: Self-pay | Admitting: Internal Medicine

## 2015-09-25 ENCOUNTER — Other Ambulatory Visit: Payer: Self-pay

## 2015-09-25 MED ORDER — NADOLOL 40 MG PO TABS: 40.0000 mg | ORAL_TABLET | Freq: Every day | ORAL | Status: DC

## 2015-10-07 ENCOUNTER — Telehealth: Payer: Self-pay | Admitting: Nurse Practitioner

## 2015-10-07 ENCOUNTER — Ambulatory Visit: Payer: Medicare Other | Admitting: Nurse Practitioner

## 2015-10-07 NOTE — Telephone Encounter (Signed)
Pt was a no show

## 2015-10-07 NOTE — Telephone Encounter (Signed)
Noted  

## 2015-10-08 ENCOUNTER — Emergency Department (HOSPITAL_COMMUNITY): Payer: Medicare Other

## 2015-10-08 ENCOUNTER — Encounter (HOSPITAL_COMMUNITY): Payer: Self-pay | Admitting: Emergency Medicine

## 2015-10-08 ENCOUNTER — Inpatient Hospital Stay (HOSPITAL_COMMUNITY)
Admission: EM | Admit: 2015-10-08 | Discharge: 2015-10-15 | DRG: 617 | Disposition: A | Discharge status: Home Health | Payer: Medicare Other | Attending: Internal Medicine | Admitting: Internal Medicine

## 2015-10-08 DIAGNOSIS — K766 Portal hypertension: Secondary | ICD-10-CM | POA: Diagnosis present

## 2015-10-08 DIAGNOSIS — M79675 Pain in left toe(s): Secondary | ICD-10-CM | POA: Diagnosis present

## 2015-10-08 DIAGNOSIS — N184 Chronic kidney disease, stage 4 (severe): Secondary | ICD-10-CM | POA: Diagnosis present

## 2015-10-08 DIAGNOSIS — E039 Hypothyroidism, unspecified: Secondary | ICD-10-CM | POA: Diagnosis present

## 2015-10-08 DIAGNOSIS — N179 Acute kidney failure, unspecified: Secondary | ICD-10-CM | POA: Diagnosis present

## 2015-10-08 DIAGNOSIS — Z825 Family history of asthma and other chronic lower respiratory diseases: Secondary | ICD-10-CM | POA: Diagnosis not present

## 2015-10-08 DIAGNOSIS — Z794 Long term (current) use of insulin: Secondary | ICD-10-CM | POA: Diagnosis not present

## 2015-10-08 DIAGNOSIS — E11628 Type 2 diabetes mellitus with other skin complications: Secondary | ICD-10-CM | POA: Diagnosis present

## 2015-10-08 DIAGNOSIS — N189 Chronic kidney disease, unspecified: Secondary | ICD-10-CM

## 2015-10-08 DIAGNOSIS — L97529 Non-pressure chronic ulcer of other part of left foot with unspecified severity: Secondary | ICD-10-CM | POA: Diagnosis present

## 2015-10-08 DIAGNOSIS — K7460 Unspecified cirrhosis of liver: Secondary | ICD-10-CM | POA: Diagnosis present

## 2015-10-08 DIAGNOSIS — E7800 Pure hypercholesterolemia, unspecified: Secondary | ICD-10-CM | POA: Diagnosis present

## 2015-10-08 DIAGNOSIS — E038 Other specified hypothyroidism: Secondary | ICD-10-CM | POA: Diagnosis not present

## 2015-10-08 DIAGNOSIS — IMO0002 Reserved for concepts with insufficient information to code with codable children: Secondary | ICD-10-CM | POA: Diagnosis present

## 2015-10-08 DIAGNOSIS — M86172 Other acute osteomyelitis, left ankle and foot: Secondary | ICD-10-CM | POA: Diagnosis not present

## 2015-10-08 DIAGNOSIS — J449 Chronic obstructive pulmonary disease, unspecified: Secondary | ICD-10-CM | POA: Diagnosis present

## 2015-10-08 DIAGNOSIS — E1122 Type 2 diabetes mellitus with diabetic chronic kidney disease: Secondary | ICD-10-CM | POA: Diagnosis present

## 2015-10-08 DIAGNOSIS — E1165 Type 2 diabetes mellitus with hyperglycemia: Secondary | ICD-10-CM | POA: Diagnosis present

## 2015-10-08 DIAGNOSIS — Z823 Family history of stroke: Secondary | ICD-10-CM

## 2015-10-08 DIAGNOSIS — Z87891 Personal history of nicotine dependence: Secondary | ICD-10-CM

## 2015-10-08 DIAGNOSIS — L089 Local infection of the skin and subcutaneous tissue, unspecified: Secondary | ICD-10-CM

## 2015-10-08 DIAGNOSIS — R188 Other ascites: Secondary | ICD-10-CM | POA: Diagnosis present

## 2015-10-08 DIAGNOSIS — M84675A Pathological fracture in other disease, left foot, initial encounter for fracture: Secondary | ICD-10-CM | POA: Diagnosis present

## 2015-10-08 DIAGNOSIS — D61818 Other pancytopenia: Secondary | ICD-10-CM | POA: Diagnosis present

## 2015-10-08 DIAGNOSIS — Z833 Family history of diabetes mellitus: Secondary | ICD-10-CM

## 2015-10-08 DIAGNOSIS — L03032 Cellulitis of left toe: Secondary | ICD-10-CM | POA: Diagnosis present

## 2015-10-08 DIAGNOSIS — E1169 Type 2 diabetes mellitus with other specified complication: Principal | ICD-10-CM | POA: Diagnosis present

## 2015-10-08 DIAGNOSIS — E1152 Type 2 diabetes mellitus with diabetic peripheral angiopathy with gangrene: Secondary | ICD-10-CM | POA: Diagnosis present

## 2015-10-08 DIAGNOSIS — E875 Hyperkalemia: Secondary | ICD-10-CM | POA: Diagnosis present

## 2015-10-08 DIAGNOSIS — I509 Heart failure, unspecified: Secondary | ICD-10-CM | POA: Diagnosis not present

## 2015-10-08 DIAGNOSIS — M869 Osteomyelitis, unspecified: Secondary | ICD-10-CM | POA: Diagnosis present

## 2015-10-08 DIAGNOSIS — I129 Hypertensive chronic kidney disease with stage 1 through stage 4 chronic kidney disease, or unspecified chronic kidney disease: Secondary | ICD-10-CM | POA: Diagnosis present

## 2015-10-08 DIAGNOSIS — E11621 Type 2 diabetes mellitus with foot ulcer: Secondary | ICD-10-CM | POA: Diagnosis present

## 2015-10-08 DIAGNOSIS — E1101 Type 2 diabetes mellitus with hyperosmolarity with coma: Secondary | ICD-10-CM | POA: Diagnosis not present

## 2015-10-08 LAB — CBC WITH DIFFERENTIAL/PLATELET
Basophils Absolute: 0 10*3/uL (ref 0.0–0.1)
Basophils Relative: 1 %
EOS PCT: 3 %
Eosinophils Absolute: 0.1 10*3/uL (ref 0.0–0.7)
HCT: 29.7 % — ABNORMAL LOW (ref 39.0–52.0)
Hemoglobin: 10.3 g/dL — ABNORMAL LOW (ref 13.0–17.0)
LYMPHS ABS: 1 10*3/uL (ref 0.7–4.0)
LYMPHS PCT: 25 %
MCH: 33.4 pg (ref 26.0–34.0)
MCHC: 34.7 g/dL (ref 30.0–36.0)
MCV: 96.4 fL (ref 78.0–100.0)
MONO ABS: 0.3 10*3/uL (ref 0.1–1.0)
Monocytes Relative: 9 %
Neutro Abs: 2.4 10*3/uL (ref 1.7–7.7)
Neutrophils Relative %: 63 %
PLATELETS: 88 10*3/uL — AB (ref 150–400)
RBC: 3.08 MIL/uL — AB (ref 4.22–5.81)
RDW: 14.6 % (ref 11.5–15.5)
WBC: 3.8 10*3/uL — AB (ref 4.0–10.5)

## 2015-10-08 LAB — BASIC METABOLIC PANEL
ANION GAP: 8 (ref 5–15)
BUN: 33 mg/dL — AB (ref 6–20)
CALCIUM: 9.3 mg/dL (ref 8.9–10.3)
CO2: 22 mmol/L (ref 22–32)
CREATININE: 2.83 mg/dL — AB (ref 0.61–1.24)
Chloride: 110 mmol/L (ref 101–111)
GFR calc Af Amer: 25 mL/min — ABNORMAL LOW (ref >60)
GFR, EST NON AFRICAN AMERICAN: 22 mL/min — AB (ref >60)
GLUCOSE: 89 mg/dL (ref 65–99)
Potassium: 5.1 mmol/L (ref 3.5–5.1)
Sodium: 140 mmol/L (ref 135–145)

## 2015-10-08 LAB — CBG MONITORING, ED: Glucose-Capillary: 87 mg/dL (ref 65–99)

## 2015-10-08 LAB — GLUCOSE, CAPILLARY: GLUCOSE-CAPILLARY: 87 mg/dL (ref 65–99)

## 2015-10-08 MED ORDER — LEVOTHYROXINE SODIUM 75 MCG PO TABS
150.0000 ug | ORAL_TABLET | Freq: Every day | ORAL | Status: DC
  Administered 2015-10-09 – 2015-10-15 (×7): 150 ug via ORAL
  Filled 2015-10-08 (×7): qty 2

## 2015-10-08 MED ORDER — ONDANSETRON HCL 4 MG/2ML IJ SOLN: 4.0000 mg | Freq: Three times a day (TID) | INTRAMUSCULAR | Status: DC | PRN

## 2015-10-08 MED ORDER — ENOXAPARIN SODIUM 40 MG/0.4ML ~~LOC~~ SOLN
40.0000 mg | SUBCUTANEOUS | Status: DC
  Administered 2015-10-08: 40 mg via SUBCUTANEOUS
  Filled 2015-10-08: qty 0.4

## 2015-10-08 MED ORDER — GABAPENTIN 400 MG PO CAPS
800.0000 mg | ORAL_CAPSULE | Freq: Two times a day (BID) | ORAL | Status: DC
  Administered 2015-10-08 – 2015-10-15 (×13): 800 mg via ORAL
  Filled 2015-10-08 (×13): qty 2

## 2015-10-08 MED ORDER — PANTOPRAZOLE SODIUM 40 MG PO TBEC
40.0000 mg | DELAYED_RELEASE_TABLET | Freq: Every day | ORAL | Status: DC
  Administered 2015-10-09 – 2015-10-15 (×6): 40 mg via ORAL
  Filled 2015-10-08 (×7): qty 1

## 2015-10-08 MED ORDER — VANCOMYCIN HCL IN DEXTROSE 1-5 GM/200ML-% IV SOLN
1000.0000 mg | Freq: Once | INTRAVENOUS | Status: AC
  Administered 2015-10-08: 1000 mg via INTRAVENOUS
  Filled 2015-10-08: qty 200

## 2015-10-08 MED ORDER — TIZANIDINE HCL 4 MG PO TABS
4.0000 mg | ORAL_TABLET | Freq: Every day | ORAL | Status: DC
  Administered 2015-10-08 – 2015-10-14 (×7): 4 mg via ORAL
  Filled 2015-10-08 (×7): qty 1

## 2015-10-08 MED ORDER — INSULIN ASPART 100 UNIT/ML ~~LOC~~ SOLN
0.0000 [IU] | Freq: Three times a day (TID) | SUBCUTANEOUS | Status: DC
  Administered 2015-10-09 – 2015-10-11 (×2): 1 [IU] via SUBCUTANEOUS
  Administered 2015-10-12: 2 [IU] via SUBCUTANEOUS
  Administered 2015-10-13: 1 [IU] via SUBCUTANEOUS
  Administered 2015-10-15 (×2): 2 [IU] via SUBCUTANEOUS

## 2015-10-08 MED ORDER — VANCOMYCIN HCL 10 G IV SOLR
1250.0000 mg | INTRAVENOUS | Status: DC
  Administered 2015-10-09 – 2015-10-11 (×3): 1250 mg via INTRAVENOUS
  Filled 2015-10-08 (×5): qty 1250

## 2015-10-08 MED ORDER — SODIUM CHLORIDE 0.9 % IV SOLN
INTRAVENOUS | Status: DC
  Administered 2015-10-08 – 2015-10-10 (×4): via INTRAVENOUS
  Administered 2015-10-11: 1000 mL via INTRAVENOUS
  Administered 2015-10-12 – 2015-10-14 (×3): via INTRAVENOUS

## 2015-10-08 MED ORDER — ONDANSETRON HCL 4 MG PO TABS: 4.0000 mg | ORAL_TABLET | Freq: Four times a day (QID) | ORAL | Status: DC | PRN

## 2015-10-08 MED ORDER — ONDANSETRON HCL 4 MG/2ML IJ SOLN: 4.0000 mg | Freq: Four times a day (QID) | INTRAMUSCULAR | Status: DC | PRN

## 2015-10-08 MED ORDER — TAMSULOSIN HCL 0.4 MG PO CAPS
0.4000 mg | ORAL_CAPSULE | Freq: Every day | ORAL | Status: DC
  Administered 2015-10-08 – 2015-10-14 (×7): 0.4 mg via ORAL
  Filled 2015-10-08 (×7): qty 1

## 2015-10-08 MED ORDER — ALPRAZOLAM 0.25 MG PO TABS
0.2500 mg | ORAL_TABLET | Freq: Every evening | ORAL | Status: DC | PRN
  Administered 2015-10-08 – 2015-10-14 (×4): 0.25 mg via ORAL
  Filled 2015-10-08 (×4): qty 1

## 2015-10-08 MED ORDER — NADOLOL 40 MG PO TABS
40.0000 mg | ORAL_TABLET | Freq: Every day | ORAL | Status: DC
  Administered 2015-10-09 – 2015-10-15 (×3): 40 mg via ORAL
  Filled 2015-10-08 (×8): qty 1

## 2015-10-08 MED ORDER — PIPERACILLIN-TAZOBACTAM 3.375 G IVPB
3.3750 g | Freq: Three times a day (TID) | INTRAVENOUS | Status: DC
  Administered 2015-10-08 – 2015-10-13 (×15): 3.375 g via INTRAVENOUS
  Filled 2015-10-08 (×8): qty 50

## 2015-10-08 MED ORDER — ROPINIROLE HCL 1 MG PO TABS
0.5000 mg | ORAL_TABLET | Freq: Every day | ORAL | Status: DC
  Administered 2015-10-08 – 2015-10-14 (×6): 0.5 mg via ORAL
  Filled 2015-10-08 (×7): qty 1

## 2015-10-08 MED ORDER — SPIRONOLACTONE 25 MG PO TABS
50.0000 mg | ORAL_TABLET | Freq: Every day | ORAL | Status: DC
  Administered 2015-10-09 – 2015-10-11 (×3): 50 mg via ORAL
  Filled 2015-10-08 (×4): qty 2

## 2015-10-08 MED ORDER — OXYCODONE HCL 5 MG PO TABS
5.0000 mg | ORAL_TABLET | ORAL | Status: DC | PRN
  Administered 2015-10-09 – 2015-10-15 (×6): 5 mg via ORAL
  Filled 2015-10-08 (×7): qty 1

## 2015-10-08 MED ORDER — INSULIN GLARGINE 100 UNIT/ML ~~LOC~~ SOLN
15.0000 [IU] | Freq: Every day | SUBCUTANEOUS | Status: DC
  Administered 2015-10-08 – 2015-10-15 (×7): 15 [IU] via SUBCUTANEOUS
  Filled 2015-10-08 (×8): qty 0.15
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
## 2015-10-08 NOTE — ED Notes (Signed)
Nurse accessed IV but unable to get blood at this time.   Vanc not given until first culture drawn.

## 2015-10-08 NOTE — ED Notes (Signed)
Pt states that his left great toe is infected and has a wound.  Tried to see general surgeon and was referred to ER.

## 2015-10-08 NOTE — Progress Notes (Signed)
ANTIBIOTIC CONSULT NOTE - INITIAL  Pharmacy Consult for vancomycin and zosyn Indication: wound infection: osteomyelitis  No Known Allergies  Patient Measurements: Height: 5\' 9"  (175.3 cm) Weight: 223 lb 3.2 oz (101.243 kg) IBW/kg (Calculated) : 70.7  Vital Signs: Temp: 98 F (36.7 C) (01/10 1844) Temp Source: Oral (01/10 1413) BP: 165/90 mmHg (01/10 1844) Pulse Rate: 81 (01/10 1844) Intake/Output from previous day:   Intake/Output from this shift:    Labs:  Recent Labs  10/08/15 1549  WBC 3.8*  HGB 10.3*  PLT 88*  CREATININE 2.83*   Estimated Creatinine Clearance: 30.1 mL/min (by C-G formula based on Cr of 2.83). No results for input(s): VANCOTROUGH, VANCOPEAK, VANCORANDOM, GENTTROUGH, GENTPEAK, GENTRANDOM, TOBRATROUGH, TOBRAPEAK, TOBRARND, AMIKACINPEAK, AMIKACINTROU, AMIKACIN in the last 72 hours.   Microbiology: Recent Results (from the past 720 hour(s))  Culture, blood (Routine X 2) w Reflex to ID Panel     Status: None (Preliminary result)   Collection Time: 10/08/15  3:49 PM  Result Value Ref Range Status   Specimen Description BLOOD LEFT ARM  Final   Special Requests BOTTLES DRAWN AEROBIC AND ANAEROBIC 6CC  Final   Culture PENDING  Incomplete   Report Status PENDING  Incomplete  Culture, blood (Routine X 2) w Reflex to ID Panel     Status: None (Preliminary result)   Collection Time: 10/08/15  3:53 PM  Result Value Ref Range Status   Specimen Description BLOOD LEFT HAND  Final   Special Requests BOTTLES DRAWN AEROBIC AND ANAEROBIC 6CC  Final   Culture PENDING  Incomplete   Report Status PENDING  Incomplete    Medical History: Past Medical History  Diagnosis Date  . Diabetes mellitus   . Thyroid disease   . High cholesterol   . Hypertension   . Cirrhosis of liver (HCC)   . Splenomegaly   . Portal hypertension (HCC)   . Thrombocytopenia (HCC)   . COPD (chronic obstructive pulmonary disease) (HCC)   . Hypothyroidism     Medications:  Vancomycin  1gm IV given in ED at 1600 10/08/15  Assessment: 65 year old male who  has a past medical history of Diabetes mellitus; Thyroid disease; High cholesterol; Hypertension; Cirrhosis of liver (HCC); Splenomegaly; Portal hypertension (HCC); Thrombocytopenia (HCC); COPD (chronic obstructive pulmonary disease) (HCC); and Hypothyroidism. Pt presents with left foot pain and swelling which started about 2 weeks ago. Patien tstepped on a nail  With his left foot and since then the foot has been getting worse with foul-smelling discharge noted today. Xray of foot in ED shows great toe changes due to destructive bony changes of osteomyelitis. Moderate degenerative changes involving the interphalangeal joint but no findings for septic arthritis.   Goal of Therapy:  Vancomycin trough level 15-20 mcg/ml  Plan:  Zosyn 3.375g IV Q8H extended dosing interval Vancomycin additional 1gm IV now for total 2gm today, then 1250mg  IV Q24hrs Measure antibiotic drug levels at steady state Follow up culture results  Monitor V/S and labs  Elder CyphersLorie Veretta Sabourin, BS Pharm D, BCPS Clinical Pharmacist Pager [phone] 10/08/2015,7:04 PM

## 2015-10-09 DIAGNOSIS — M86172 Other acute osteomyelitis, left ankle and foot: Secondary | ICD-10-CM

## 2015-10-09 DIAGNOSIS — E038 Other specified hypothyroidism: Secondary | ICD-10-CM

## 2015-10-09 DIAGNOSIS — E1169 Type 2 diabetes mellitus with other specified complication: Principal | ICD-10-CM

## 2015-10-09 DIAGNOSIS — L089 Local infection of the skin and subcutaneous tissue, unspecified: Secondary | ICD-10-CM

## 2015-10-09 LAB — COMPREHENSIVE METABOLIC PANEL
ALBUMIN: 2.4 g/dL — AB (ref 3.5–5.0)
ALT: 17 U/L (ref 17–63)
ANION GAP: 4 — AB (ref 5–15)
AST: 32 U/L (ref 15–41)
Alkaline Phosphatase: 114 U/L (ref 38–126)
BUN: 35 mg/dL — ABNORMAL HIGH (ref 6–20)
CHLORIDE: 112 mmol/L — AB (ref 101–111)
CO2: 25 mmol/L (ref 22–32)
CREATININE: 2.99 mg/dL — AB (ref 0.61–1.24)
Calcium: 8.4 mg/dL — ABNORMAL LOW (ref 8.9–10.3)
GFR calc non Af Amer: 20 mL/min — ABNORMAL LOW (ref >60)
GFR, EST AFRICAN AMERICAN: 24 mL/min — AB (ref >60)
Glucose, Bld: 101 mg/dL — ABNORMAL HIGH (ref 65–99)
Potassium: 4.8 mmol/L (ref 3.5–5.1)
SODIUM: 141 mmol/L (ref 135–145)
Total Bilirubin: 0.9 mg/dL (ref 0.3–1.2)
Total Protein: 5.5 g/dL — ABNORMAL LOW (ref 6.5–8.1)

## 2015-10-09 LAB — CBC
HCT: 24.7 % — ABNORMAL LOW (ref 39.0–52.0)
Hemoglobin: 8.4 g/dL — ABNORMAL LOW (ref 13.0–17.0)
MCH: 32.8 pg (ref 26.0–34.0)
MCHC: 34 g/dL (ref 30.0–36.0)
MCV: 96.5 fL (ref 78.0–100.0)
PLATELETS: 72 10*3/uL — AB (ref 150–400)
RBC: 2.56 MIL/uL — AB (ref 4.22–5.81)
RDW: 14.6 % (ref 11.5–15.5)
WBC: 2.7 10*3/uL — ABNORMAL LOW (ref 4.0–10.5)

## 2015-10-09 LAB — GLUCOSE, CAPILLARY
GLUCOSE-CAPILLARY: 93 mg/dL (ref 65–99)
GLUCOSE-CAPILLARY: 119 mg/dL — AB (ref 65–99)
GLUCOSE-CAPILLARY: 134 mg/dL — AB (ref 65–99)
Glucose-Capillary: 103 mg/dL — ABNORMAL HIGH (ref 65–99)

## 2015-10-09 LAB — HEMOGLOBIN A1C
Hgb A1c MFr Bld: 5.6 % (ref 4.8–5.6)
MEAN PLASMA GLUCOSE: 114 mg/dL

## 2015-10-09 MED ORDER — ENOXAPARIN SODIUM 30 MG/0.3ML ~~LOC~~ SOLN
30.0000 mg | SUBCUTANEOUS | Status: DC
  Administered 2015-10-09 – 2015-10-14 (×5): 30 mg via SUBCUTANEOUS
  Filled 2015-10-09 (×6): qty 0.3

## 2015-10-09 NOTE — Care Management Note (Signed)
Case Management Note  Patient Details  Name: [name] [name] MRN: [id number] Date of Birth: April 24, 1951  Subjective/Objective:                  Pt admitted from home with osteomylitis. Pt lives with his wife and will return home at discharge. Pt is fairly independent with ADL's. Pt has a cane for home use.  Action/Plan: Will continue to follow for discharge planning needs.   Expected Discharge Date:  10/10/15               Expected Discharge Plan:  Home w Home Health Services  In-House Referral:  NA  Discharge planning Services  CM Consult  Post Acute Care Choice:  Home Health Choice offered to:  NA  DME Arranged:    DME Agency:     HH Arranged:    HH Agency:     Status of Service:  In process, will continue to follow  Medicare Important Message Given:    Date Medicare IM Given:    Medicare IM give by:    Date Additional Medicare IM Given:    Additional Medicare Important Message give by:     If discussed at Long Length of Stay Meetings, dates discussed:    Additional Comments:  Cheryl FlashBlackwell, Lashaye Fisk Crowder, RN 10/09/2015, 10:41 AM

## 2015-10-09 NOTE — Progress Notes (Addendum)
[facility] PROGRESS NOTE  [name] [name] ZOX:[id number] DOB: 01/10/1951 DOA: 10/08/2015 PCP: [name], PA-C  Assessment/Plan: Diabetic foot infection/osteomyelitis -Plain films confirm osteomyelitis of the distal phalanx of the left big toe. -We'll request surgical consultation. -Continue vancomycin and Zosyn for now.  Hypothyroidism -Continue Synthroid  Diabetes mellitus -Well controlled, continue current regimen  Acute on chronic kidney disease stage IV -Creatinine is slightly worsened from baseline of 2.4-2.5. -Continue gentle hydration. -Continue to monitor.  Pancytopenia -Chronic, likely related to history of cirrhosis. -Continue outpatient follow-up with GI for cirrhosis.  Code Status: Full code Family Communication: Wife at bedside updated on plan of care and all questions answered  Disposition Plan: To be determined   Consultants:  Surgery   Antibiotics:  Vancomycin  Zosyn   Subjective: Feels that redness and swelling surrounding the toe is a little improved from yesterday, complains of migraine headache  Objective: Filed Vitals:   10/08/15 1844 10/08/15 2051 10/09/15 0617 10/09/15 1358  BP: 165/90 155/87 90/69 139/75  Pulse: 81 95 59 53  Temp: 98 F (36.7 C) 97.9 F (36.6 C) 97.7 F (36.5 C) 97.6 F (36.4 C)  TempSrc:  Oral Oral Oral  Resp: 18 20 20 20   Height: 5\' 9"  (1.753 m)     Weight: 101.243 kg (223 lb 3.2 oz)     SpO2: 100% 100% 100% 100%    Intake/Output Summary (Last 24 hours) at 10/09/15 1615 Last data filed at 10/09/15 1359  Gross per 24 hour  Intake    480 ml  Output      0 ml  Net    480 ml   Filed Weights   10/08/15 1413 10/08/15 1844  Weight: 104.327 kg (230 lb) 101.243 kg (223 lb 3.2 oz)    Exam:   General:  Alert, awake, oriented 3  Cardiovascular: Regular rate and rhythm  Respiratory: Clear to auscultation bilaterally  Abdomen: Soft, nontender, nondistended, positive bowel  sounds  Extremities: Right: No clubbing, cyanosis or edema. Left see picture:         Neurologic:  Grossly intact and nonfocal   Data Reviewed: Basic Metabolic Panel:  Recent Labs Lab 10/08/15 1549 10/09/15 0524  NA 140 141  K 5.1 4.8  CL 110 112*  CO2 22 25  GLUCOSE 89 101*  BUN 33* 35*  CREATININE 2.83* 2.99*  CALCIUM 9.3 8.4*   Liver Function Tests:  Recent Labs Lab 10/09/15 0524  AST 32  ALT 17  ALKPHOS 114  BILITOT 0.9  PROT 5.5*  ALBUMIN 2.4*   No results for input(s): LIPASE, AMYLASE in the last 168 hours. No results for input(s): AMMONIA in the last 168 hours. CBC:  Recent Labs Lab 10/08/15 1549 10/09/15 0524  WBC 3.8* 2.7*  NEUTROABS 2.4  --   HGB 10.3* 8.4*  HCT 29.7* 24.7*  MCV 96.4 96.5  PLT 88* 72*   Cardiac Enzymes: No results for input(s): CKTOTAL, CKMB, CKMBINDEX, TROPONINI in the last 168 hours. BNP (last 3 results) No results for input(s): BNP in the last 8760 hours.  ProBNP (last 3 results) No results for input(s): PROBNP in the last 8760 hours.  CBG:  Recent Labs Lab 10/08/15 1506 10/08/15 2047 10/09/15 0746 10/09/15 1141  GLUCAP 87 87 93 119*    Recent Results (from the past 240 hour(s))  Wound culture     Status: None (Preliminary result)   Collection Time: 10/08/15  3:00 PM  Result Value Ref Range Status  Specimen Description ABSCESS  Final   Special Requests Immunocompromised  Final   Gram Stain   Final    RARE WBC PRESENT, PREDOMINANTLY PMN NO SQUAMOUS EPITHELIAL CELLS SEEN ABUNDANT GRAM POSITIVE COCCI IN PAIRS [facility]    Culture PENDING  Incomplete   Report Status PENDING  Incomplete  Culture, blood (Routine X 2) w Reflex to ID Panel     Status: None (Preliminary result)   Collection Time: 10/08/15  3:49 PM  Result Value Ref Range Status   Specimen Description BLOOD LEFT ARM  Final   Special Requests BOTTLES DRAWN AEROBIC AND ANAEROBIC 6CC  Final   Culture PENDING   Incomplete   Report Status PENDING  Incomplete  Culture, blood (Routine X 2) w Reflex to ID Panel     Status: None (Preliminary result)   Collection Time: 10/08/15  3:53 PM  Result Value Ref Range Status   Specimen Description BLOOD LEFT HAND  Final   Special Requests BOTTLES DRAWN AEROBIC AND ANAEROBIC [facility]6CC  Final   Culture PENDING  Incomplete   Report Status PENDING  Incomplete     Studies: Dg Chest 2 View  10/08/2015  CLINICAL DATA:  Cough EXAM: CHEST - 2 VIEW COMPARISON:  08/15/2015 FINDINGS: Cardiac shadow is stable. Mild interstitial changes are again seen bilaterally but improved from the prior study. No focal infiltrate or sizable effusion is seen. No bony abnormality is noted. IMPRESSION: No acute abnormality noted. Electronically Signed   By: Alcide CleverMark  Lukens M.D.   On: 10/08/2015 16:27   [place] Venous Img Lower Unilateral Left  10/08/2015  CLINICAL DATA:  Infected left toe.  Evaluate for DVT. EXAM: LEFT LOWER EXTREMITY VENOUS DOPPLER ULTRASOUND TECHNIQUE: Gray-scale sonography with graded compression, as well as color Doppler and duplex ultrasound were performed to evaluate the lower extremity deep venous systems from the level of the common femoral vein and including the common femoral, femoral, profunda femoral, popliteal and calf veins including the posterior tibial, peroneal and gastrocnemius veins when visible. The superficial great saphenous vein was also interrogated. Spectral Doppler was utilized to evaluate flow at rest and with distal augmentation maneuvers in the common femoral, femoral and popliteal veins. COMPARISON:  None. FINDINGS: Contralateral Common Femoral Vein: Respiratory phasicity is normal and symmetric with the symptomatic side. No evidence of thrombus. Normal compressibility. Common Femoral Vein: No evidence of thrombus. Normal compressibility, respiratory phasicity and response to augmentation. Saphenofemoral Junction: No evidence of thrombus. Normal compressibility and  flow on color Doppler imaging. Profunda Femoral Vein: No evidence of thrombus. Normal compressibility and flow on color Doppler imaging. Femoral Vein: No evidence of thrombus. Normal compressibility, respiratory phasicity and response to augmentation. Popliteal Vein: No evidence of thrombus. Normal compressibility, respiratory phasicity and response to augmentation. Calf Veins: No evidence of thrombus. Normal compressibility and flow on color Doppler imaging. Superficial Great Saphenous Vein: No evidence of thrombus. Normal compressibility and flow on color Doppler imaging. Venous Reflux:  None. Other Findings: A moderate amount of subcutaneous edema is noted at the level of the left lower leg and calf (representative images 32 through 34). IMPRESSION: No evidence of DVT within the left lower extremity. Electronically Signed   By: Simonne ComeJohn  Watts M.D.   On: 10/08/2015 16:52   Dg Toe Great Left  10/08/2015  CLINICAL DATA:  Pain and swelling for 1 week.  Open wound. EXAM: LEFT GREAT TOE COMPARISON:  None. FINDINGS: There is a pathologic fracture involving the distal phalanx of the great toe  due to destructive bony changes of osteomyelitis. Moderate degenerative changes involving the interphalangeal joint but no findings for septic arthritis. The other joint spaces are maintained. IMPRESSION: Osteomyelitis and pathologic fracture involving the distal aspect of the distal phalanx of the great toe with significant surrounding soft tissue swelling and tissue loss. Electronically Signed   By: Rudie Meyer M.D.   On: 10/08/2015 16:26    Scheduled Meds: . enoxaparin (LOVENOX) injection  30 mg Subcutaneous Q24H  . gabapentin  800 mg Oral BID  . insulin aspart  0-9 Units Subcutaneous TID WC  . insulin glargine  15 Units Subcutaneous QHS  . levothyroxine  150 mcg Oral QAC breakfast  . nadolol  40 mg Oral Daily  . pantoprazole  40 mg Oral Daily  . piperacillin-tazobactam (ZOSYN)  IV  3.375 g Intravenous Q8H  .  rOPINIRole  0.5 mg Oral QHS  . spironolactone  50 mg Oral Daily  . tamsulosin  0.4 mg Oral QHS  . tiZANidine  4 mg Oral QHS  . vancomycin  1,250 mg Intravenous Q24H   Continuous Infusions: . sodium chloride 75 mL/hr at 10/09/15 1610    Active Problems:   Diabetes mellitus type 2, uncontrolled (HCC)   Hypothyroidism   Cirrhosis (HCC)   Osteomyelitis (HCC)   Diabetic foot infection (HCC)    Time spent: 25 minutes.Greater than 50% of this time was spent in direct contact with the patient coordinating care.    Chaya Jan  [facility] Pager [phone]  If 7PM-7AM, please contact night-coverage at [email or web address], password [facility] 10/09/2015, 4:15 PM  LOS: 1 day

## 2015-10-09 NOTE — Progress Notes (Signed)
Patient had podiatry consult. Dr. Nolen MuMcKinney out of office till Tuesday 10/15/15, Dr. Ardyth HarpsHernandez notified.

## 2015-10-10 LAB — GLUCOSE, CAPILLARY
GLUCOSE-CAPILLARY: 113 mg/dL — AB (ref 65–99)
GLUCOSE-CAPILLARY: 141 mg/dL — AB (ref 65–99)
Glucose-Capillary: 74 mg/dL (ref 65–99)
Glucose-Capillary: 116 mg/dL — ABNORMAL HIGH (ref 65–99)

## 2015-10-10 LAB — CBC
HCT: 26.5 % — ABNORMAL LOW (ref 39.0–52.0)
Hemoglobin: 8.9 g/dL — ABNORMAL LOW (ref 13.0–17.0)
MCH: 32.5 pg (ref 26.0–34.0)
MCHC: 33.6 g/dL (ref 30.0–36.0)
MCV: 96.7 fL (ref 78.0–100.0)
PLATELETS: 68 10*3/uL — AB (ref 150–400)
RBC: 2.74 MIL/uL — ABNORMAL LOW (ref 4.22–5.81)
RDW: 14.5 % (ref 11.5–15.5)
WBC: 2.7 10*3/uL — ABNORMAL LOW (ref 4.0–10.5)

## 2015-10-10 LAB — COMPREHENSIVE METABOLIC PANEL
ALBUMIN: 2.4 g/dL — AB (ref 3.5–5.0)
ALK PHOS: 97 U/L (ref 38–126)
ALT: 16 U/L — AB (ref 17–63)
AST: 27 U/L (ref 15–41)
Anion gap: 5 (ref 5–15)
BUN: 35 mg/dL — AB (ref 6–20)
CALCIUM: 8.4 mg/dL — AB (ref 8.9–10.3)
CO2: 23 mmol/L (ref 22–32)
CREATININE: 2.93 mg/dL — AB (ref 0.61–1.24)
Chloride: 111 mmol/L (ref 101–111)
GFR calc Af Amer: 24 mL/min — ABNORMAL LOW (ref >60)
GFR calc non Af Amer: 21 mL/min — ABNORMAL LOW (ref >60)
GLUCOSE: 94 mg/dL (ref 65–99)
Potassium: 4.8 mmol/L (ref 3.5–5.1)
SODIUM: 139 mmol/L (ref 135–145)
Total Bilirubin: 1 mg/dL (ref 0.3–1.2)
Total Protein: 5.6 g/dL — ABNORMAL LOW (ref 6.5–8.1)

## 2015-10-10 NOTE — Progress Notes (Signed)
**Note De-Identified  Obfuscation** EKG done and results reported to the RN
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
## 2015-10-10 NOTE — Progress Notes (Signed)
[facility] PROGRESS NOTE  [name] [name] NWG:[id number] DOB: 1951-04-25 DOA: 10/08/2015 PCP: [name], PA-C  Assessment/Plan: Diabetic foot infection/osteomyelitis -Plain films confirm osteomyelitis of the distal phalanx of the left big toe. -Continue vancomycin and Zosyn for now. -Per surgery, planned to let to cool off over weekend with potential surgical amputation early next week.  Hypothyroidism -Continue Synthroid  Diabetes mellitus -Well controlled, continue current regimen  Acute on chronic kidney disease stage IV -Creatinine is slightly worsened from baseline of 2.4-2.5. -Continue gentle hydration. -Continue to monitor.  Pancytopenia -Chronic, likely related to history of cirrhosis. -Continue outpatient follow-up with GI for cirrhosis.  Code Status: Full code Family Communication: Wife at bedside updated on plan of care and all questions answered  Disposition Plan: To be determined   Consultants:  Surgery   Antibiotics:  Vancomycin  Zosyn   Subjective: Feels that redness and swelling surrounding the toe is a little improved.  Objective: Filed Vitals:   10/09/15 1358 10/09/15 2033 10/10/15 0613 10/10/15 1151  BP: 139/75 124/50 107/70 148/84  Pulse: 53 61 92 82  Temp: 97.6 F (36.4 C) 98.3 F (36.8 C) 97.7 F (36.5 C) 98.9 F (37.2 C)  TempSrc: Oral Oral Oral Oral  Resp: 20 20 20 20   Height:      Weight:      SpO2: 100% 100% 100% 96%    Intake/Output Summary (Last 24 hours) at 10/10/15 1543 Last data filed at 10/10/15 0930  Gross per 24 hour  Intake    480 ml  Output      0 ml  Net    480 ml   Filed Weights   10/08/15 1413 10/08/15 1844  Weight: 104.327 kg (230 lb) 101.243 kg (223 lb 3.2 oz)    Exam:   General:  Alert, awake, oriented 3  Cardiovascular: Regular rate and rhythm  Respiratory: Clear to auscultation bilaterally  Abdomen: Soft, nontender, nondistended, positive bowel sounds  Extremities: Right: No  clubbing, cyanosis or edema. Left see picture:         Neurologic:  Grossly intact and nonfocal   Data Reviewed: Basic Metabolic Panel:  Recent Labs Lab 10/08/15 1549 10/09/15 0524 10/10/15 0544  NA 140 141 139  K 5.1 4.8 4.8  CL 110 112* 111  CO2 22 25 23   GLUCOSE 89 101* 94  BUN 33* 35* 35*  CREATININE 2.83* 2.99* 2.93*  CALCIUM 9.3 8.4* 8.4*   Liver Function Tests:  Recent Labs Lab 10/09/15 0524 10/10/15 0544  AST 32 27  ALT 17 16*  ALKPHOS 114 97  BILITOT 0.9 1.0  PROT 5.5* 5.6*  ALBUMIN 2.4* 2.4*   No results for input(s): LIPASE, AMYLASE in the last 168 hours. No results for input(s): AMMONIA in the last 168 hours. CBC:  Recent Labs Lab 10/08/15 1549 10/09/15 0524 10/10/15 0544  WBC 3.8* 2.7* 2.7*  NEUTROABS 2.4  --   --   HGB 10.3* 8.4* 8.9*  HCT 29.7* 24.7* 26.5*  MCV 96.4 96.5 96.7  PLT 88* 72* 68*   Cardiac Enzymes: No results for input(s): CKTOTAL, CKMB, CKMBINDEX, TROPONINI in the last 168 hours. BNP (last 3 results) No results for input(s): BNP in the last 8760 hours.  ProBNP (last 3 results) No results for input(s): PROBNP in the last 8760 hours.  CBG:  Recent Labs Lab 10/09/15 1141 10/09/15 1640 10/09/15 2031 10/10/15 0729 10/10/15 1130  GLUCAP 119* 134* 103* 74 116*    Recent Results (from the  past 240 hour(s))  Wound culture     Status: None (Preliminary result)   Collection Time: 10/08/15  3:00 PM  Result Value Ref Range Status   Specimen Description ABSCESS  Final   Special Requests Immunocompromised  Final   Gram Stain   Final    RARE WBC PRESENT, PREDOMINANTLY PMN NO SQUAMOUS EPITHELIAL CELLS SEEN ABUNDANT GRAM POSITIVE COCCI IN PAIRS [facility]    Culture   Final    Culture reincubated for better growth Performed at [facility]    Report Status PENDING  Incomplete  Culture, blood (Routine X 2) w Reflex to ID Panel     Status: None (Preliminary result)   Collection Time:  10/08/15  3:49 PM  Result Value Ref Range Status   Specimen Description BLOOD LEFT ARM  Final   Special Requests BOTTLES DRAWN AEROBIC AND ANAEROBIC 6CC  Final   Culture PENDING  Incomplete   Report Status PENDING  Incomplete  Culture, blood (Routine X 2) w Reflex to ID Panel     Status: None (Preliminary result)   Collection Time: 10/08/15  3:53 PM  Result Value Ref Range Status   Specimen Description BLOOD LEFT HAND  Final   Special Requests BOTTLES DRAWN AEROBIC AND ANAEROBIC [facility]6CC  Final   Culture PENDING  Incomplete   Report Status PENDING  Incomplete     Studies: Dg Chest 2 View  10/08/2015  CLINICAL DATA:  Cough EXAM: CHEST - 2 VIEW COMPARISON:  08/15/2015 FINDINGS: Cardiac shadow is stable. Mild interstitial changes are again seen bilaterally but improved from the prior study. No focal infiltrate or sizable effusion is seen. No bony abnormality is noted. IMPRESSION: No acute abnormality noted. Electronically Signed   By: Alcide CleverMark  Lukens M.D.   On: 10/08/2015 16:27   [place] Venous Img Lower Unilateral Left  10/08/2015  CLINICAL DATA:  Infected left toe.  Evaluate for DVT. EXAM: LEFT LOWER EXTREMITY VENOUS DOPPLER ULTRASOUND TECHNIQUE: Gray-scale sonography with graded compression, as well as color Doppler and duplex ultrasound were performed to evaluate the lower extremity deep venous systems from the level of the common femoral vein and including the common femoral, femoral, profunda femoral, popliteal and calf veins including the posterior tibial, peroneal and gastrocnemius veins when visible. The superficial great saphenous vein was also interrogated. Spectral Doppler was utilized to evaluate flow at rest and with distal augmentation maneuvers in the common femoral, femoral and popliteal veins. COMPARISON:  None. FINDINGS: Contralateral Common Femoral Vein: Respiratory phasicity is normal and symmetric with the symptomatic side. No evidence of thrombus. Normal compressibility. Common Femoral  Vein: No evidence of thrombus. Normal compressibility, respiratory phasicity and response to augmentation. Saphenofemoral Junction: No evidence of thrombus. Normal compressibility and flow on color Doppler imaging. Profunda Femoral Vein: No evidence of thrombus. Normal compressibility and flow on color Doppler imaging. Femoral Vein: No evidence of thrombus. Normal compressibility, respiratory phasicity and response to augmentation. Popliteal Vein: No evidence of thrombus. Normal compressibility, respiratory phasicity and response to augmentation. Calf Veins: No evidence of thrombus. Normal compressibility and flow on color Doppler imaging. Superficial Great Saphenous Vein: No evidence of thrombus. Normal compressibility and flow on color Doppler imaging. Venous Reflux:  None. Other Findings: A moderate amount of subcutaneous edema is noted at the level of the left lower leg and calf (representative images 32 through 34). IMPRESSION: No evidence of DVT within the left lower extremity. Electronically Signed   By: Simonne ComeJohn  Watts M.D.   On:  10/08/2015 16:52   Dg Toe Great Left  10/08/2015  CLINICAL DATA:  Pain and swelling for 1 week.  Open wound. EXAM: LEFT GREAT TOE COMPARISON:  None. FINDINGS: There is a pathologic fracture involving the distal phalanx of the great toe due to destructive bony changes of osteomyelitis. Moderate degenerative changes involving the interphalangeal joint but no findings for septic arthritis. The other joint spaces are maintained. IMPRESSION: Osteomyelitis and pathologic fracture involving the distal aspect of the distal phalanx of the great toe with significant surrounding soft tissue swelling and tissue loss. Electronically Signed   By: Rudie Meyer M.D.   On: 10/08/2015 16:26    Scheduled Meds: . enoxaparin (LOVENOX) injection  30 mg Subcutaneous Q24H  . gabapentin  800 mg Oral BID  . insulin aspart  0-9 Units Subcutaneous TID WC  . insulin glargine  15 Units Subcutaneous QHS    . levothyroxine  150 mcg Oral QAC breakfast  . nadolol  40 mg Oral Daily  . pantoprazole  40 mg Oral Daily  . piperacillin-tazobactam (ZOSYN)  IV  3.375 g Intravenous Q8H  . rOPINIRole  0.5 mg Oral QHS  . spironolactone  50 mg Oral Daily  . tamsulosin  0.4 mg Oral QHS  . tiZANidine  4 mg Oral QHS  . vancomycin  1,250 mg Intravenous Q24H   Continuous Infusions: . sodium chloride 75 mL/hr at 10/10/15 1610    Active Problems:   Diabetes mellitus type 2, uncontrolled (HCC)   Hypothyroidism   Cirrhosis (HCC)   Osteomyelitis (HCC)   Diabetic foot infection (HCC)    Time spent: 25 minutes.Greater than 50% of this time was spent in direct contact with the patient coordinating care.    Chaya Jan  [facility] Pager [phone]  If 7PM-7AM, please contact night-coverage at [email or web address], password [facility] 10/10/2015, 3:43 PM  LOS: 2 days

## 2015-10-11 ENCOUNTER — Inpatient Hospital Stay (HOSPITAL_COMMUNITY): Payer: Medicare Other

## 2015-10-11 DIAGNOSIS — I509 Heart failure, unspecified: Secondary | ICD-10-CM

## 2015-10-11 LAB — CREATININE, URINE, RANDOM: Creatinine, Urine: 147.49 mg/dL

## 2015-10-11 LAB — WOUND CULTURE

## 2015-10-11 LAB — SODIUM, URINE, RANDOM: SODIUM UR: 98 mmol/L

## 2015-10-11 LAB — BASIC METABOLIC PANEL
Anion gap: 3 — ABNORMAL LOW (ref 5–15)
BUN: 33 mg/dL — AB (ref 6–20)
CALCIUM: 8.1 mg/dL — AB (ref 8.9–10.3)
CO2: 24 mmol/L (ref 22–32)
CREATININE: 3.05 mg/dL — AB (ref 0.61–1.24)
Chloride: 112 mmol/L — ABNORMAL HIGH (ref 101–111)
GFR calc non Af Amer: 20 mL/min — ABNORMAL LOW (ref >60)
GFR, EST AFRICAN AMERICAN: 23 mL/min — AB (ref >60)
Glucose, Bld: 122 mg/dL — ABNORMAL HIGH (ref 65–99)
Potassium: 5.1 mmol/L (ref 3.5–5.1)
Sodium: 139 mmol/L (ref 135–145)

## 2015-10-11 LAB — CBC
HCT: 25.1 % — ABNORMAL LOW (ref 39.0–52.0)
Hemoglobin: 8.4 g/dL — ABNORMAL LOW (ref 13.0–17.0)
MCH: 32.4 pg (ref 26.0–34.0)
MCHC: 33.5 g/dL (ref 30.0–36.0)
MCV: 96.9 fL (ref 78.0–100.0)
PLATELETS: 67 10*3/uL — AB (ref 150–400)
RBC: 2.59 MIL/uL — AB (ref 4.22–5.81)
RDW: 14.5 % (ref 11.5–15.5)
WBC: 4.1 10*3/uL (ref 4.0–10.5)

## 2015-10-11 LAB — GLUCOSE, CAPILLARY
GLUCOSE-CAPILLARY: 100 mg/dL — AB (ref 65–99)
GLUCOSE-CAPILLARY: 129 mg/dL — AB (ref 65–99)
Glucose-Capillary: 88 mg/dL (ref 65–99)
Glucose-Capillary: 108 mg/dL — ABNORMAL HIGH (ref 65–99)

## 2015-10-11 MED ORDER — SODIUM CHLORIDE 0.9 % IV SOLN: Freq: Once | INTRAVENOUS | Status: AC

## 2015-10-11 NOTE — Progress Notes (Signed)
[facility] PROGRESS NOTE  [name] [name] WUJ:[id number] DOB: March 30, 1951 DOA: 10/08/2015 PCP: [name], PA-C  Assessment/Plan: Diabetic foot infection/osteomyelitis -Plain films confirm osteomyelitis of the distal phalanx of the left big toe. -Continue vancomycin and Zosyn for now. -Per surgery, planned to let to cool off over weekend with potential surgical amputation early next week.  Hypothyroidism -Continue Synthroid  Diabetes mellitus -Well controlled, continue current regimen  Acute on chronic kidney disease stage IV -Creatinine is slightly worsened from baseline of 2.4-2.5. -Continue gentle hydration. -Continue to monitor. -Check renal function in am.  Pancytopenia -Chronic, likely related to history of cirrhosis. -Continue outpatient follow-up with GI for cirrhosis.  Code Status: Full code Family Communication: Wife at bedside updated on plan of care and all questions answered  Disposition Plan: To be determined   Consultants:  Surgery   Antibiotics:  Vancomycin  Zosyn   Subjective: Feels that redness and swelling surrounding the toe is a little improved.  Objective: Filed Vitals:   10/10/15 2054 10/11/15 0639 10/11/15 0656 10/11/15 1012  BP:  72/45 99/57 131/77  Pulse:  51 47 55  Temp:  97.7 F (36.5 C)    TempSrc:  Oral    Resp:  18    Height:      Weight:      SpO2: 99% 99%      Intake/Output Summary (Last 24 hours) at 10/11/15 1205 Last data filed at 10/10/15 1900  Gross per 24 hour  Intake    240 ml  Output      0 ml  Net    240 ml   Filed Weights   10/08/15 1413 10/08/15 1844  Weight: 104.327 kg (230 lb) 101.243 kg (223 lb 3.2 oz)    Exam:   General:  Alert, awake, oriented 3  Cardiovascular: Regular rate and rhythm  Respiratory: Clear to auscultation bilaterally  Abdomen: Soft, nontender, nondistended, positive bowel sounds  Extremities: Right: No clubbing, cyanosis or edema. Left see picture:          Neurologic:  Grossly intact and nonfocal   Data Reviewed: Basic Metabolic Panel:  Recent Labs Lab 10/08/15 1549 10/09/15 0524 10/10/15 0544 10/11/15 0543  NA 140 141 139 139  K 5.1 4.8 4.8 5.1  CL 110 112* 111 112*  CO2 22 25 23 24   GLUCOSE 89 101* 94 122*  BUN 33* 35* 35* 33*  CREATININE 2.83* 2.99* 2.93* 3.05*  CALCIUM 9.3 8.4* 8.4* 8.1*   Liver Function Tests:  Recent Labs Lab 10/09/15 0524 10/10/15 0544  AST 32 27  ALT 17 16*  ALKPHOS 114 97  BILITOT 0.9 1.0  PROT 5.5* 5.6*  ALBUMIN 2.4* 2.4*   No results for input(s): LIPASE, AMYLASE in the last 168 hours. No results for input(s): AMMONIA in the last 168 hours. CBC:  Recent Labs Lab 10/08/15 1549 10/09/15 0524 10/10/15 0544 10/11/15 0543  WBC 3.8* 2.7* 2.7* 4.1  NEUTROABS 2.4  --   --   --   HGB 10.3* 8.4* 8.9* 8.4*  HCT 29.7* 24.7* 26.5* 25.1*  MCV 96.4 96.5 96.7 96.9  PLT 88* 72* 68* 67*   Cardiac Enzymes: No results for input(s): CKTOTAL, CKMB, CKMBINDEX, TROPONINI in the last 168 hours. BNP (last 3 results) No results for input(s): BNP in the last 8760 hours.  ProBNP (last 3 results) No results for input(s): PROBNP in the last 8760 hours.  CBG:  Recent Labs Lab 10/10/15 1130 10/10/15 1659 10/10/15 2041 10/11/15 0756  10/11/15 1118  GLUCAP 116* 113* 141* 100* 88    Recent Results (from the past 240 hour(s))  Wound culture     Status: None   Collection Time: 10/08/15  3:00 PM  Result Value Ref Range Status   Specimen Description ABSCESS  Final   Special Requests Immunocompromised  Final   Gram Stain   Final    RARE WBC PRESENT, PREDOMINANTLY PMN NO SQUAMOUS EPITHELIAL CELLS SEEN ABUNDANT GRAM POSITIVE COCCI IN PAIRS [facility]    Culture   Final    MULTIPLE ORGANISMS PRESENT, NONE PREDOMINANT Note: NO STAPHYLOCOCCUS AUREUS ISOLATED NO GROUP A STREP (S.PYOGENES) ISOLATED Performed at [facility]    Report Status 10/11/2015  FINAL  Final  Culture, blood (Routine X 2) w Reflex to ID Panel     Status: None (Preliminary result)   Collection Time: 10/08/15  3:49 PM  Result Value Ref Range Status   Specimen Description BLOOD LEFT ARM  Final   Special Requests BOTTLES DRAWN AEROBIC AND ANAEROBIC 6CC  Final   Culture NO GROWTH 3 DAYS  Final   Report Status PENDING  Incomplete  Culture, blood (Routine X 2) w Reflex to ID Panel     Status: None (Preliminary result)   Collection Time: 10/08/15  3:53 PM  Result Value Ref Range Status   Specimen Description BLOOD LEFT HAND  Final   Special Requests BOTTLES DRAWN AEROBIC AND ANAEROBIC 6CC  Final   Culture NO GROWTH 3 DAYS  Final   Report Status PENDING  Incomplete     Studies: No results found.  Scheduled Meds: . sodium chloride   Intravenous Once  . enoxaparin (LOVENOX) injection  30 mg Subcutaneous Q24H  . gabapentin  800 mg Oral BID  . insulin aspart  0-9 Units Subcutaneous TID WC  . insulin glargine  15 Units Subcutaneous QHS  . levothyroxine  150 mcg Oral QAC breakfast  . nadolol  40 mg Oral Daily  . pantoprazole  40 mg Oral Daily  . piperacillin-tazobactam (ZOSYN)  IV  3.375 g Intravenous Q8H  . rOPINIRole  0.5 mg Oral QHS  . spironolactone  50 mg Oral Daily  . tamsulosin  0.4 mg Oral QHS  . tiZANidine  4 mg Oral QHS  . vancomycin  1,250 mg Intravenous Q24H   Continuous Infusions: . sodium chloride 1,000 mL (10/11/15 1014)    Active Problems:   Diabetes mellitus type 2, uncontrolled (HCC)   Hypothyroidism   Cirrhosis (HCC)   Osteomyelitis (HCC)   Diabetic foot infection (HCC)    Time spent: 25 minutes.Greater than 50% of this time was spent in direct contact with the patient coordinating care.    Chaya JanHERNANDEZ ACOSTA,[name] [name]  [facility] Pager [phone]  If 7PM-7AM, please contact night-coverage at [email or web address], password [email or web address] 10/11/2015, 12:05 PM  LOS: 3 days
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
## 2015-10-11 NOTE — Progress Notes (Signed)
Have temporarily schedule patient for left great toe amputation on 10/14/2015. Will repeat labs in a.m. May need transfusions prior to surgery, including platelets.

## 2015-10-11 NOTE — Plan of Care (Signed)
Problem: Safety: Goal: Ability to remain free from injury will improve Outcome: Not Progressing Pt educated on risk factors and importance of notifying staff when getting up. Pt refuses to wear yellow socks when ambulating. Educated pt on the importance of having on non skid footwear.   Problem: Physical Regulation: Goal: Ability to maintain clinical measurements within normal limits will improve Outcome: Progressing See flowsheet. Goal: Will remain free from infection Outcome: Progressing PT currently receiving iv antibiotics.

## 2015-10-11 NOTE — Care Management Important Message (Signed)
Important Message  Patient Details  Name: [name] [name] MRN: [id number] Date of Birth: 03/02/1951   Medicare Important Message Given:  Yes    Adonis HugueninBerkhead, Cordelro Gautreau L, RN 10/11/2015, 9:22 AM

## 2015-10-12 LAB — ABO/RH: ABO/RH(D): A NEG

## 2015-10-12 LAB — BASIC METABOLIC PANEL
Anion gap: 4 — ABNORMAL LOW (ref 5–15)
BUN: 34 mg/dL — AB (ref 6–20)
CALCIUM: 8 mg/dL — AB (ref 8.9–10.3)
CHLORIDE: 114 mmol/L — AB (ref 101–111)
CO2: 22 mmol/L (ref 22–32)
CREATININE: 2.92 mg/dL — AB (ref 0.61–1.24)
GFR calc non Af Amer: 21 mL/min — ABNORMAL LOW (ref >60)
GFR, EST AFRICAN AMERICAN: 24 mL/min — AB (ref >60)
Glucose, Bld: 97 mg/dL (ref 65–99)
Potassium: 5.4 mmol/L — ABNORMAL HIGH (ref 3.5–5.1)
SODIUM: 140 mmol/L (ref 135–145)

## 2015-10-12 LAB — GLUCOSE, CAPILLARY
GLUCOSE-CAPILLARY: 144 mg/dL — AB (ref 65–99)
Glucose-Capillary: 78 mg/dL (ref 65–99)
Glucose-Capillary: 132 mg/dL — ABNORMAL HIGH (ref 65–99)
Glucose-Capillary: 171 mg/dL — ABNORMAL HIGH (ref 65–99)

## 2015-10-12 LAB — CBC
HCT: 25.1 % — ABNORMAL LOW (ref 39.0–52.0)
Hemoglobin: 8.5 g/dL — ABNORMAL LOW (ref 13.0–17.0)
MCH: 32.8 pg (ref 26.0–34.0)
MCHC: 33.9 g/dL (ref 30.0–36.0)
MCV: 96.9 fL (ref 78.0–100.0)
Platelets: 56 10*3/uL — ABNORMAL LOW (ref 150–400)
RBC: 2.59 MIL/uL — ABNORMAL LOW (ref 4.22–5.81)
RDW: 14.6 % (ref 11.5–15.5)
WBC: 3.4 10*3/uL — ABNORMAL LOW (ref 4.0–10.5)

## 2015-10-12 LAB — PREPARE RBC (CROSSMATCH)

## 2015-10-12 LAB — PROTIME-INR
INR: 1.59 — AB (ref 0.00–1.49)
PROTHROMBIN TIME: 19 s — AB (ref 11.6–15.2)

## 2015-10-12 LAB — VANCOMYCIN, TROUGH: VANCOMYCIN TR: 31 ug/mL — AB (ref 10.0–20.0)

## 2015-10-12 MED ORDER — SODIUM CHLORIDE 0.9 % IV SOLN: Freq: Once | INTRAVENOUS | Status: AC

## 2015-10-12 MED ORDER — FUROSEMIDE 10 MG/ML IJ SOLN
60.0000 mg | Freq: Two times a day (BID) | INTRAMUSCULAR | Status: DC
  Administered 2015-10-12: 60 mg via INTRAVENOUS
  Filled 2015-10-12 (×2): qty 6

## 2015-10-12 NOTE — Progress Notes (Signed)
[facility] PROGRESS NOTE  [name] [name] WUJ:[id number] DOB: May 13, 1951 DOA: 10/08/2015 PCP: Remus LofflerJones, [name], PA-C  Assessment/Plan: Diabetic foot infection/osteomyelitis -Plain films confirm osteomyelitis of the distal phalanx of the left big toe. -Continue vancomycin and Zosyn for now. -Per surgery, planned to let to cool off over weekend with potential surgical amputation early next week.  Hypothyroidism -Continue Synthroid  Diabetes mellitus -Well controlled, continue current regimen  Acute on chronic kidney disease stage IV -Creatinine is slightly worsened from baseline of 2.4-2.5. -Continue gentle hydration. -Continue to monitor. -Check renal function in am.  Pancytopenia -Chronic, likely related to history of cirrhosis. -Continue outpatient follow-up with GI for cirrhosis. -Hemoglobin is 8.5. Surgery is transfusing 2 units of PRBCs in anticipation of OR on Monday.  Code Status: Full code Family Communication: Wife at bedside updated on plan of care and all questions answered  Disposition Plan: To be determined   Consultants:  Surgery   Antibiotics:  Vancomycin  Zosyn   Subjective: Feels that redness and swelling surrounding the toe is a little improved.  Objective: Filed Vitals:   10/12/15 1022 10/12/15 1343 10/12/15 1611 10/12/15 1615  BP: 124/73 114/55 109/64 141/66  Pulse: 56 59 59 61  Temp:  97.7 F (36.5 C) 97.9 F (36.6 C) 97.7 F (36.5 C)  TempSrc:  Oral Oral Oral  Resp:  20 20 20   Height:      Weight:      SpO2:  100%      Intake/Output Summary (Last 24 hours) at 10/12/15 1643 Last data filed at 10/12/15 1611  Gross per 24 hour  Intake    290 ml  Output      0 ml  Net    290 ml   Filed Weights   10/08/15 1413 10/08/15 1844  Weight: 104.327 kg (230 lb) 101.243 kg (223 lb 3.2 oz)    Exam:   General:  Alert, awake, oriented 3  Cardiovascular: Regular rate and rhythm  Respiratory: Clear to auscultation  bilaterally  Abdomen: Soft, nontender, nondistended, positive bowel sounds  Extremities: Right: No clubbing, cyanosis or edema. Left see picture:         Neurologic:  Grossly intact and nonfocal   Data Reviewed: Basic Metabolic Panel:  Recent Labs Lab 10/08/15 1549 10/09/15 0524 10/10/15 0544 10/11/15 0543 10/12/15 0555  NA 140 141 139 139 140  K 5.1 4.8 4.8 5.1 5.4*  CL 110 112* 111 112* 114*  CO2 22 25 23 24 22   GLUCOSE 89 101* 94 122* 97  BUN 33* 35* 35* 33* 34*  CREATININE 2.83* 2.99* 2.93* 3.05* 2.92*  CALCIUM 9.3 8.4* 8.4* 8.1* 8.0*   Liver Function Tests:  Recent Labs Lab 10/09/15 0524 10/10/15 0544  AST 32 27  ALT 17 16*  ALKPHOS 114 97  BILITOT 0.9 1.0  PROT 5.5* 5.6*  ALBUMIN 2.4* 2.4*   No results for input(s): LIPASE, AMYLASE in the last 168 hours. No results for input(s): AMMONIA in the last 168 hours. CBC:  Recent Labs Lab 10/08/15 1549 10/09/15 0524 10/10/15 0544 10/11/15 0543 10/12/15 0555  WBC 3.8* 2.7* 2.7* 4.1 3.4*  NEUTROABS 2.4  --   --   --   --   HGB 10.3* 8.4* 8.9* 8.4* 8.5*  HCT 29.7* 24.7* 26.5* 25.1* 25.1*  MCV 96.4 96.5 96.7 96.9 96.9  PLT 88* 72* 68* 67* 56*   Cardiac Enzymes: No results for input(s): CKTOTAL, CKMB, CKMBINDEX, TROPONINI in the last 168 hours. BNP (  last 3 results) No results for input(s): BNP in the last 8760 hours.  ProBNP (last 3 results) No results for input(s): PROBNP in the last 8760 hours.  CBG:  Recent Labs Lab 10/11/15 1118 10/11/15 1630 10/11/15 2103 10/12/15 0809 10/12/15 1147  GLUCAP 88 129* 108* 78 144*    Recent Results (from the past 240 hour(s))  Wound culture     Status: None   Collection Time: 10/08/15  3:00 PM  Result Value Ref Range Status   Specimen Description ABSCESS  Final   Special Requests Immunocompromised  Final   Gram Stain   Final    RARE WBC PRESENT, PREDOMINANTLY PMN NO SQUAMOUS EPITHELIAL CELLS SEEN ABUNDANT GRAM POSITIVE COCCI IN PAIRS Performed  at [facility]    Culture   Final    MULTIPLE ORGANISMS PRESENT, NONE PREDOMINANT Note: NO STAPHYLOCOCCUS AUREUS ISOLATED NO GROUP A STREP (S.PYOGENES) ISOLATED Performed at [facility]    Report Status 10/11/2015 FINAL  Final  Culture, blood (Routine X 2) w Reflex to ID Panel     Status: None (Preliminary result)   Collection Time: 10/08/15  3:49 PM  Result Value Ref Range Status   Specimen Description BLOOD LEFT ARM  Final   Special Requests BOTTLES DRAWN AEROBIC AND ANAEROBIC 6CC  Final   Culture NO GROWTH 4 DAYS  Final   Report Status PENDING  Incomplete  Culture, blood (Routine X 2) w Reflex to ID Panel     Status: None (Preliminary result)   Collection Time: 10/08/15  3:53 PM  Result Value Ref Range Status   Specimen Description BLOOD LEFT HAND  Final   Special Requests BOTTLES DRAWN AEROBIC AND ANAEROBIC 6CC  Final   Culture NO GROWTH 4 DAYS  Final   Report Status PENDING  Incomplete     Studies: No results found.  Scheduled Meds: . sodium chloride   Intravenous Once  . enoxaparin (LOVENOX) injection  30 mg Subcutaneous Q24H  . furosemide  60 mg Intravenous BID  . gabapentin  800 mg Oral BID  . insulin aspart  0-9 Units Subcutaneous TID WC  . insulin glargine  15 Units Subcutaneous QHS  . levothyroxine  150 mcg Oral QAC breakfast  . nadolol  40 mg Oral Daily  . pantoprazole  40 mg Oral Daily  . piperacillin-tazobactam (ZOSYN)  IV  3.375 g Intravenous Q8H  . rOPINIRole  0.5 mg Oral QHS  . tamsulosin  0.4 mg Oral QHS  . tiZANidine  4 mg Oral QHS  . vancomycin  1,250 mg Intravenous Q24H   Continuous Infusions: . sodium chloride 100 mL/hr at 10/12/15 1610    Active Problems:   Diabetes mellitus type 2, uncontrolled (HCC)   Hypothyroidism   Cirrhosis (HCC)   Osteomyelitis (HCC)   Diabetic foot infection (HCC)    Time spent: 25 minutes.Greater than 50% of this time was spent in direct contact with the patient coordinating  care.    Chaya Jan  [facility] Pager [phone]  If 7PM-7AM, please contact night-coverage at [email or web address], password [facility] 10/12/2015, 4:43 PM  LOS: 4 days

## 2015-10-12 NOTE — Progress Notes (Signed)
Subjective: Patient presently denies any difficulty breathing. His appetite is good. No fever chills or sweating.   Objective: Vital signs in last 24 hours: Temp:  [97.6 F (36.4 C)-97.9 F (36.6 C)] 97.9 F (36.6 C) (01/14 0646) Pulse Rate:  [51-56] 51 (01/14 0646) Resp:  [18] 18 (01/14 0646) BP: (86-131)/(42-77) 86/42 mmHg (01/14 0646) SpO2:  [97 %-100 %] 99 % (01/14 0646)  Intake/Output from previous day: 01/13 0701 - 01/14 0700 In: 720 [P.O.:720] Out: -  Intake/Output this shift:     Recent Labs  10/10/15 0544 10/11/15 0543 10/12/15 0555  HGB 8.9* 8.4* 8.5*    Recent Labs  10/11/15 0543 10/12/15 0555  WBC 4.1 3.4*  RBC 2.59* 2.59*  HCT 25.1* 25.1*  PLT 67* 56*    Recent Labs  10/11/15 0543 10/12/15 0555  NA 139 140  K 5.1 5.4*  CL 112* 114*  CO2 24 22  BUN 33* 34*  CREATININE 3.05* 2.92*  GLUCOSE 122* 97  CALCIUM 8.1* 8.0*    Recent Labs  10/12/15 0555  INR 1.59*    Generally patient is alert and in no apparent distress Chest: Decreased breath sound bilaterally Heart exam regular rate and rhythm no murmur Abdomen: Distended but nontender Extremities: He has 1+ edema on the right. He has dressing of his left foot.  Assessment/Plan: Problem #1 acute kidney injury his BUN and creatinine presently is improving. Possibly ATN. At this moment very difficult to estimate his urine output as patient is going to the bathroom frequently. Problem #2 hyperkalemia mild. Possibly a combination of high potassium intake/ renal failure/, Aldactone/ Problem #3 ascites Problem #4 anemia: This is a part of his pancytopenia. Problem #5 hypotension Problem #6 diabetic foot ulcer. Problem #7 diabetes Plan: 1 ] advice to be on low potassium intake 2] will hold Aldactone 3] will start patient on Lasix 60 mg IV twice a day 4] will check compressive metabolic panel in the morning.   Delmi Fulfer S 10/12/2015, 9:24 AM

## 2015-10-12 NOTE — Progress Notes (Signed)
CRITICAL VALUE ALERT  Critical value received:  Vancomycin level of 31  Date of notification:  10/12/15  Time of notification:  2217  Critical value read back:Yes.    Nurse who received alert:  Richardean ChimeraMarion Nyomi Howser  MD notified (1st page):  Holding Vanc per instructions, per pharmacy no need to call they will f/u in am  Time of first page:    MD notified (2nd page):  Time of second page:  Responding MD:    Time MD responded:

## 2015-10-12 NOTE — Progress Notes (Signed)
Labs noted. Will give 2 units packed red blood cells slowly today. Will recheck CBC in a.m.

## 2015-10-13 ENCOUNTER — Encounter (HOSPITAL_COMMUNITY): Payer: Self-pay | Admitting: *Deleted

## 2015-10-13 DIAGNOSIS — E1101 Type 2 diabetes mellitus with hyperosmolarity with coma: Secondary | ICD-10-CM

## 2015-10-13 LAB — CULTURE, BLOOD (ROUTINE X 2)
CULTURE: NO GROWTH
Culture: NO GROWTH

## 2015-10-13 LAB — GLUCOSE, CAPILLARY
GLUCOSE-CAPILLARY: 132 mg/dL — AB (ref 65–99)
GLUCOSE-CAPILLARY: 104 mg/dL — AB (ref 65–99)
GLUCOSE-CAPILLARY: 104 mg/dL — AB (ref 65–99)
Glucose-Capillary: 140 mg/dL — ABNORMAL HIGH (ref 65–99)

## 2015-10-13 LAB — COMPREHENSIVE METABOLIC PANEL
ALBUMIN: 2.3 g/dL — AB (ref 3.5–5.0)
ALK PHOS: 99 U/L (ref 38–126)
ALT: 16 U/L — ABNORMAL LOW (ref 17–63)
ANION GAP: 5 (ref 5–15)
AST: 31 U/L (ref 15–41)
BILIRUBIN TOTAL: 1.9 mg/dL — AB (ref 0.3–1.2)
BUN: 36 mg/dL — ABNORMAL HIGH (ref 6–20)
CALCIUM: 8 mg/dL — AB (ref 8.9–10.3)
CO2: 21 mmol/L — ABNORMAL LOW (ref 22–32)
Chloride: 113 mmol/L — ABNORMAL HIGH (ref 101–111)
Creatinine, Ser: 3.08 mg/dL — ABNORMAL HIGH (ref 0.61–1.24)
GFR, EST AFRICAN AMERICAN: 23 mL/min — AB (ref >60)
GFR, EST NON AFRICAN AMERICAN: 20 mL/min — AB (ref >60)
GLUCOSE: 168 mg/dL — AB (ref 65–99)
Potassium: 4.2 mmol/L (ref 3.5–5.1)
Sodium: 139 mmol/L (ref 135–145)
TOTAL PROTEIN: 5.3 g/dL — AB (ref 6.5–8.1)

## 2015-10-13 LAB — CBC
HEMATOCRIT: 27.3 % — AB (ref 39.0–52.0)
HEMOGLOBIN: 9.3 g/dL — AB (ref 13.0–17.0)
MCH: 32 pg (ref 26.0–34.0)
MCHC: 34.1 g/dL (ref 30.0–36.0)
MCV: 93.8 fL (ref 78.0–100.0)
Platelets: 51 10*3/uL — ABNORMAL LOW (ref 150–400)
RBC: 2.91 MIL/uL — ABNORMAL LOW (ref 4.22–5.81)
RDW: 15.6 % — AB (ref 11.5–15.5)
WBC: 3.3 10*3/uL — ABNORMAL LOW (ref 4.0–10.5)

## 2015-10-13 LAB — PREPARE RBC (CROSSMATCH)

## 2015-10-13 MED ORDER — CEPHALEXIN 500 MG PO CAPS
500.0000 mg | ORAL_CAPSULE | Freq: Three times a day (TID) | ORAL | Status: DC
Start: 2015-10-13 — End: 2015-10-15
  Administered 2015-10-13 – 2015-10-15 (×5): 500 mg via ORAL
  Filled 2015-10-13 (×6): qty 1

## 2015-10-13 MED ORDER — SODIUM CHLORIDE 0.9 % IV BOLUS (SEPSIS)
500.0000 mL | Freq: Once | INTRAVENOUS | Status: AC
  Administered 2015-10-13: 500 mL via INTRAVENOUS

## 2015-10-13 MED ORDER — SODIUM CHLORIDE 0.9 % IV SOLN: Freq: Once | INTRAVENOUS | Status: DC

## 2015-10-13 MED ORDER — FUROSEMIDE 10 MG/ML IJ SOLN
80.0000 mg | Freq: Two times a day (BID) | INTRAMUSCULAR | Status: DC
  Filled 2015-10-13: qty 8

## 2015-10-13 MED ORDER — VANCOMYCIN HCL 10 G IV SOLR
1750.0000 mg | INTRAVENOUS | Status: DC
  Filled 2015-10-13: qty 1750

## 2015-10-13 NOTE — Progress Notes (Signed)
Scheduled for left great toe amputation, possible second left toe amputation tomorrow. We'll give 1 unit of packed red blood cells and 2 packs of platelets today. The risks and benefits of the procedure were fully explained to the patient, who gave informed consent.

## 2015-10-13 NOTE — Progress Notes (Signed)
ANTIBIOTIC CONSULT NOTE - follow up  Pharmacy Consult for vancomycin and zosyn Indication: wound infection: osteomyelitis  No Known Allergies  Patient Measurements: Height: 5\' 9"  (175.3 cm) Weight: 223 lb 3.2 oz (101.243 kg) IBW/kg (Calculated) : 70.7  Vital Signs: Temp: 97.8 F (36.6 C) (01/15 0456) Temp Source: Oral (01/15 0456) BP: 100/57 mmHg (01/15 0456) Pulse Rate: 61 (01/15 0456) Intake/Output from previous day: 01/14 0701 - 01/15 0700 In: 1225 [P.O.:840; Blood:385] Out: -  Intake/Output from this shift:    Labs:  Recent Labs  10/11/15 0543 10/11/15 1344 10/12/15 0555 10/13/15 0546  WBC 4.1  --  3.4* 3.3*  HGB 8.4*  --  8.5* 9.3*  PLT 67*  --  56* PENDING  LABCREA  --  147.49  --   --   CREATININE 3.05*  --  2.92* 3.08*   Estimated Creatinine Clearance: 27.7 mL/min (by C-G formula based on Cr of 3.08).  Recent Labs  10/12/15 2130  VANCOTROUGH 31*    Microbiology: Recent Results (from the past 720 hour(s))  Wound culture     Status: None   Collection Time: 10/08/15  3:00 PM  Result Value Ref Range Status   Specimen Description ABSCESS  Final   Special Requests Immunocompromised  Final   Gram Stain   Final    RARE WBC PRESENT, PREDOMINANTLY PMN NO SQUAMOUS EPITHELIAL CELLS SEEN ABUNDANT GRAM POSITIVE COCCI IN PAIRS [facility]formed [facility]    Culture   Final    MULTIPLE ORGANISMS PRESENT, NONE PREDOMINANT Note: NO STAPHYLOCOCCUS AUREUS ISOLATED NO GROUP A STREP (S.PYOGENES) ISOLATED Performed at [facility]    Report Status 10/11/2015 FINAL  Final  Culture, blood (Routine X 2) w Reflex to ID Panel     Status: None (Preliminary result)   Collection Time: 10/08/15  3:49 PM  Result Value Ref Range Status   Specimen Description BLOOD LEFT ARM  Final   Special Requests BOTTLES DRAWN AEROBIC AND ANAEROBIC 6CC  Final   Culture NO GROWTH 4 DAYS  Final   Report Status PENDING  Incomplete  Culture, blood (Routine X 2) w Reflex to  ID Panel     Status: None (Preliminary result)   Collection Time: 10/08/15  3:53 PM  Result Value Ref Range Status   Specimen Description BLOOD LEFT HAND  Final   Special Requests BOTTLES DRAWN AEROBIC AND ANAEROBIC 6CC  Final   Culture NO GROWTH 4 DAYS  Final   Report Status PENDING  Incomplete   Medical History: Past Medical History  Diagnosis Date  . Diabetes mellitus   . Thyroid disease   . High cholesterol   . Hypertension   . Cirrhosis of liver (HCC)   . Splenomegaly   . Portal hypertension (HCC)   . Thrombocytopenia (HCC)   . COPD (chronic obstructive pulmonary disease) (HCC)   . Hypothyroidism    Assessment: 65 year old male who  has a past medical history of Diabetes mellitus; Thyroid disease; High cholesterol; Hypertension; Cirrhosis of liver (HCC); Splenomegaly; Portal hypertension (HCC); Thrombocytopenia (HCC); COPD (chronic obstructive pulmonary disease) (HCC); and Hypothyroidism. Pt presents with left foot pain and swelling which started about 2 weeks ago. Patien stepped on a nail  With his left foot and since then the foot has been getting worse with foul-smelling discharge noted today. Xray of foot in ED shows great toe changes due to destructive bony changes of osteomyelitis. Moderate degenerative changes involving the interphalangeal joint but no findings for septic arthritis.  Trough  level is HIGH.  SCr elevated.  Pharmacokinetic dosing service    Vancomycin single level analysis: Current dose being given: 1250 mg Current dosing interval:  24 hrs  Single level Trough Data:  Trough level obtained: 31 mcg/ml Timing of trough - Number of hours since last dose:  23 Hrs Desired peak:  40 mcg/ml  Desired trough: 15.5 mcg/ml  Estimated PK Parameters: --------------------------- New rate constant (kel): 0.020 hr-1 New half-life: 34.66 Hours New Vd from levels: 70.84  Liters  (0.7 L/kg)  Recommendations: ==================== Give Vancomycin  1750 mg  q 48  hrs. Infuse over 1.5 hrs Expected Cpeak: 39.4 mcg/ml    Expected Ctrough: 15.5 mcg/ml  Recommended labs and intervals: Measure Bun and Scr 3 times/week.   Thank you for the consult, will continue to follow.  Goal of Therapy / Objective:  Vancomycin trough level 15-20 mcg/ml  Eradicate infection.  Plan:   Zosyn 3.375g IV Q8H extended dosing interval  Vancomycin 1750mg  IV Q48hrs  Check Vancomycin trough level weekly or sooner if warranted  Monitor SCr/BUN 3 times weekly  Duration of therapy per MD   Valrie Hart Pharm D Clinical Pharmacist 10/13/2015,7:53 AM

## 2015-10-13 NOTE — Progress Notes (Addendum)
Subjective: Patient feeling better. He denies any fever or chills or sweating. His appetite seems to be good. He  Objective: Vital signs in last 24 hours: Temp:  [97.7 F (36.5 C)-98.8 F (37.1 C)] 97.8 F (36.6 C) (01/15 0456) Pulse Rate:  [56-83] 61 (01/15 0456) Resp:  [20] 20 (01/15 0456) BP: (80-145)/(48-105) 100/57 mmHg (01/15 0456) SpO2:  [10 %-100 %] 99 % (01/15 0456)  Intake/Output from previous day: 01/14 0701 - 01/15 0700 In: 1225 [P.O.:840; Blood:385] Out: -  Intake/Output this shift: Total I/O In: 240 [P.O.:240] Out: -    Recent Labs  10/11/15 0543 10/12/15 0555 10/13/15 0546  HGB 8.4* 8.5* 9.3*    Recent Labs  10/12/15 0555 10/13/15 0546  WBC 3.4* 3.3*  RBC 2.59* 2.91*  HCT 25.1* 27.3*  PLT 56* 51*    Recent Labs  10/12/15 0555 10/13/15 0546  NA 140 139  K 5.4* 4.2  CL 114* 113*  CO2 22 21*  BUN 34* 36*  CREATININE 2.92* 3.08*  GLUCOSE 97 168*  CALCIUM 8.0* 8.0*    Recent Labs  10/12/15 0555  INR 1.59*    Generally patient is alert and in no apparent distress Chest: Decreased breath sound bilaterally Heart exam regular rate and rhythm no murmur Abdomen: Distended but nontender Extremities: He has 1+ edema on the right. He has dressing of his left foot.  Assessment/Plan: Problem #1 acute kidney injury his BUN and creatinine presently is improving. Possibly ATN and possibly vancomycin-induced kidney injury. Patient is nonoliguric. His BUN and creatinine still seems to be increasing. Patient however is asymptomatic. Problem #2 hyperkalemia mild. Possibly a combination of high potassium intake/ renal failure/, Aldactone. His potassium has corrected. Problem #3 ascites: Stable Problem #4 anemia: This is a part of his pancytopenia. Problem #5 hypotension Problem #6 diabetic foot ulcer. Problem #7 diabetes Plan: 1 ] will increase Lasix to 80 mg IV twice a day 2] will check compressive metabolic panel in the morning. 3] if his renal  function continued to tolerate patient may benefit from discontinuation of vancomycin.   Milana Salay S 10/13/2015, 10:03 AM

## 2015-10-13 NOTE — Progress Notes (Signed)
LAte entry for 10/13/2015   Notified Dr. Ardyth HarpsHernandez that due to low BP at night and early am I had been holding the Nadalol.  I also voiced to her and Dr. Lovell SheehanJenkins that the patient would receive 1 unit PRBC and 1 unit platelets but according to Darl PikesSusan form the blood bank there is a shortage on platelets and they would only release 1 unit.  She states that they will try again in the am.  Dr. Lovell SheehanJenkins and Ardyth HarpsHernandez verbalize understanding.

## 2015-10-13 NOTE — Progress Notes (Signed)
[facility] PROGRESS NOTE  [name] HouseholderRoy L [name] ZOX:[id number] DOB: [birth year] DOA: 10/08/2015 PCP: Remus LofflerJones, [name], PA-C  Assessment/Plan: Diabetic foot infection/osteomyelitis -Plain films confirm osteomyelitis of the distal phalanx of the left big toe. -Will DC broad spectrum abx and transition to keflex. -Plan for amputation in am.  Hypothyroidism -Continue Synthroid  Diabetes mellitus -Well controlled, continue current regimen  Acute on chronic kidney disease stage IV -Creatinine is slightly worsened from baseline of 2.4-2.5. -Continue gentle hydration. -Continue to monitor. -Check renal function in am. -Appreciate nephrology input and recommendations.  Pancytopenia -Chronic, likely related to history of cirrhosis. -Continue outpatient follow-up with GI for cirrhosis. -Receiving PRBCs in anticipation of OR in am as per surgery recommendations.  Code Status: Full code Family Communication: Wife at bedside updated on plan of care and all questions answered  Disposition Plan: To be determined   Consultants:  Surgery   Antibiotics:  Vancomycin  Zosyn   Subjective: Feels that redness and swelling surrounding the toe is a little improved.  Objective: Filed Vitals:   10/13/15 0204 10/13/15 0208 10/13/15 0240 10/13/15 0456  BP: 120/105 82/48 80/50 100/57  Pulse: 60  57 61  Temp: 98.8 F (37.1 C)  98.7 F (37.1 C) 97.8 F (36.6 C)  TempSrc: Oral  Oral Oral  Resp: 20  20 20   Height:      Weight:      SpO2: 97%  100% 99%    Intake/Output Summary (Last 24 hours) at 10/13/15 1231 Last data filed at 10/13/15 0909  Gross per 24 hour  Intake    985 ml  Output      0 ml  Net    985 ml   Filed Weights   10/08/15 1413 10/08/15 1844  Weight: 104.327 kg (230 lb) 101.243 kg (223 lb 3.2 oz)    Exam:   General:  Alert, awake, oriented 3  Cardiovascular: Regular rate and rhythm  Respiratory: Clear to auscultation bilaterally  Abdomen: Soft,  nontender, nondistended, positive bowel sounds  Extremities: Right: No clubbing, cyanosis or edema. Left see picture:         Neurologic:  Grossly intact and nonfocal   Data Reviewed: Basic Metabolic Panel:  Recent Labs Lab 10/09/15 0524 10/10/15 0544 10/11/15 0543 10/12/15 0555 10/13/15 0546  NA 141 139 139 140 139  K 4.8 4.8 5.1 5.4* 4.2  CL 112* 111 112* 114* 113*  CO2 25 23 24 22  21*  GLUCOSE 101* 94 122* 97 168*  BUN 35* 35* 33* 34* 36*  CREATININE 2.99* 2.93* 3.05* 2.92* 3.08*  CALCIUM 8.4* 8.4* 8.1* 8.0* 8.0*   Liver Function Tests:  Recent Labs Lab 10/09/15 0524 10/10/15 0544 10/13/15 0546  AST 32 27 31  ALT 17 16* 16*  ALKPHOS 114 97 99  BILITOT 0.9 1.0 1.9*  PROT 5.5* 5.6* 5.3*  ALBUMIN 2.4* 2.4* 2.3*   No results for input(s): LIPASE, AMYLASE in the last 168 hours. No results for input(s): AMMONIA in the last 168 hours. CBC:  Recent Labs Lab 10/08/15 1549 10/09/15 0524 10/10/15 0544 10/11/15 0543 10/12/15 0555 10/13/15 0546  WBC 3.8* 2.7* 2.7* 4.1 3.4* 3.3*  NEUTROABS 2.4  --   --   --   --   --   HGB 10.3* 8.4* 8.9* 8.4* 8.5* 9.3*  HCT 29.7* 24.7* 26.5* 25.1* 25.1* 27.3*  MCV 96.4 96.5 96.7 96.9 96.9 93.8  PLT 88* 72* 68* 67* 56* 51*   Cardiac Enzymes: No results for input(s):  CKTOTAL, CKMB, CKMBINDEX, TROPONINI in the last 168 hours. BNP (last 3 results) No results for input(s): BNP in the last 8760 hours.  ProBNP (last 3 results) No results for input(s): PROBNP in the last 8760 hours.  CBG:  Recent Labs Lab 10/12/15 1147 10/12/15 1642 10/12/15 2129 10/13/15 0731 10/13/15 1156  GLUCAP 144* 132* 171* 132* 140*    Recent Results (from the past 240 hour(s))  Wound culture     Status: None   Collection Time: 10/08/15  3:00 PM  Result Value Ref Range Status   Specimen Description ABSCESS  Final   Special Requests Immunocompromised  Final   Gram Stain   Final    RARE WBC PRESENT, PREDOMINANTLY PMN NO SQUAMOUS  EPITHELIAL CELLS SEEN ABUNDANT GRAM POSITIVE COCCI IN PAIRS Performed at [facility]    Culture   Final    MULTIPLE ORGANISMS PRESENT, NONE PREDOMINANT Note: NO STAPHYLOCOCCUS AUREUS ISOLATED NO GROUP A STREP (S.PYOGENES) ISOLATED Performed at [facility]    Report Status 10/11/2015 FINAL  Final  Culture, blood (Routine X 2) w Reflex to ID Panel     Status: None   Collection Time: 10/08/15  3:49 PM  Result Value Ref Range Status   Specimen Description BLOOD LEFT ARM  Final   Special Requests BOTTLES DRAWN AEROBIC AND ANAEROBIC 6CC  Final   Culture NO GROWTH 5 DAYS  Final   Report Status 10/13/2015 FINAL  Final  Culture, blood (Routine X 2) w Reflex to ID Panel     Status: None   Collection Time: 10/08/15  3:53 PM  Result Value Ref Range Status   Specimen Description BLOOD LEFT HAND  Final   Special Requests BOTTLES DRAWN AEROBIC AND ANAEROBIC 6CC  Final   Culture NO GROWTH 5 DAYS  Final   Report Status 10/13/2015 FINAL  Final     Studies: No results found.  Scheduled Meds: . sodium chloride   Intravenous Once  . cephALEXin  500 mg Oral 3 times per day  . enoxaparin (LOVENOX) injection  30 mg Subcutaneous Q24H  . furosemide  80 mg Intravenous BID  . gabapentin  800 mg Oral BID  . insulin aspart  0-9 Units Subcutaneous TID WC  . insulin glargine  15 Units Subcutaneous QHS  . levothyroxine  150 mcg Oral QAC breakfast  . nadolol  40 mg Oral Daily  . pantoprazole  40 mg Oral Daily  . rOPINIRole  0.5 mg Oral QHS  . tamsulosin  0.4 mg Oral QHS  . tiZANidine  4 mg Oral QHS   Continuous Infusions: . sodium chloride 100 mL/hr at 10/12/15 1610    Active Problems:   Diabetes mellitus type 2, uncontrolled (HCC)   Hypothyroidism   Cirrhosis (HCC)   Osteomyelitis (HCC)   Diabetic foot infection (HCC)    Time spent: 25 minutes.Greater than 50% of this time was spent in direct contact with the patient coordinating care.    Chaya Jan  [facility] Pager [phone]  If 7PM-7AM, please contact night-coverage at [email or web address], password [facility] 10/13/2015, 12:31 PM  LOS: 5 days

## 2015-10-13 NOTE — Progress Notes (Signed)
Patients BP is running low this morning at 80/50, Patient is in process of receiving RBC's, paging the on call MD to make him aware, of the BP, will follow any new orders received, and continue to monitor this patient.

## 2015-10-14 ENCOUNTER — Encounter (HOSPITAL_COMMUNITY): Payer: Self-pay | Admitting: *Deleted

## 2015-10-14 ENCOUNTER — Inpatient Hospital Stay (HOSPITAL_COMMUNITY): Payer: Medicare Other | Admitting: Anesthesiology

## 2015-10-14 ENCOUNTER — Encounter (HOSPITAL_COMMUNITY)
Admission: EM | Disposition: A | Discharge status: Home Health | Payer: Medicare Other | Source: Home / Self Care | Attending: Internal Medicine

## 2015-10-14 HISTORY — PX: AMPUTATION: SHX166

## 2015-10-14 LAB — BASIC METABOLIC PANEL
ANION GAP: 7 (ref 5–15)
BUN: 39 mg/dL — ABNORMAL HIGH (ref 6–20)
CHLORIDE: 112 mmol/L — AB (ref 101–111)
CO2: 22 mmol/L (ref 22–32)
Calcium: 8.2 mg/dL — ABNORMAL LOW (ref 8.9–10.3)
Creatinine, Ser: 3.17 mg/dL — ABNORMAL HIGH (ref 0.61–1.24)
GFR calc Af Amer: 22 mL/min — ABNORMAL LOW (ref >60)
GFR, EST NON AFRICAN AMERICAN: 19 mL/min — AB (ref >60)
GLUCOSE: 105 mg/dL — AB (ref 65–99)
POTASSIUM: 4.3 mmol/L (ref 3.5–5.1)
Sodium: 141 mmol/L (ref 135–145)

## 2015-10-14 LAB — GLUCOSE, CAPILLARY
GLUCOSE-CAPILLARY: 76 mg/dL (ref 65–99)
GLUCOSE-CAPILLARY: 94 mg/dL (ref 65–99)
Glucose-Capillary: 86 mg/dL (ref 65–99)
Glucose-Capillary: 87 mg/dL (ref 65–99)
Glucose-Capillary: 184 mg/dL — ABNORMAL HIGH (ref 65–99)

## 2015-10-14 LAB — MRSA PCR SCREENING: MRSA by PCR: NEGATIVE

## 2015-10-14 LAB — CBC
HEMATOCRIT: 29 % — AB (ref 39.0–52.0)
HEMOGLOBIN: 10.1 g/dL — AB (ref 13.0–17.0)
MCH: 32.3 pg (ref 26.0–34.0)
MCHC: 34.8 g/dL (ref 30.0–36.0)
MCV: 92.7 fL (ref 78.0–100.0)
PLATELETS: 63 10*3/uL — AB (ref 150–400)
RBC: 3.13 MIL/uL — AB (ref 4.22–5.81)
RDW: 15.8 % — ABNORMAL HIGH (ref 11.5–15.5)
WBC: 3.7 10*3/uL — AB (ref 4.0–10.5)

## 2015-10-14 SURGERY — AMPUTATION DIGIT
Anesthesia: General | Laterality: Left

## 2015-10-14 MED ORDER — DEXTROSE 50 % IV SOLN
INTRAVENOUS | Status: AC
  Filled 2015-10-14: qty 50

## 2015-10-14 MED ORDER — DEXTROSE 50 % IV SOLN
25.0000 mL | Freq: Once | INTRAVENOUS | Status: AC
  Administered 2015-10-14: 25 mL via INTRAVENOUS

## 2015-10-14 MED ORDER — SODIUM CHLORIDE 0.9 % IN NEBU
INHALATION_SOLUTION | RESPIRATORY_TRACT | Status: AC
  Filled 2015-10-14: qty 3

## 2015-10-14 MED ORDER — SODIUM CHLORIDE 0.9 % IR SOLN
Status: DC | PRN
  Administered 2015-10-14: 1000 mL

## 2015-10-14 MED ORDER — MIDAZOLAM HCL 2 MG/2ML IJ SOLN
1.0000 mg | INTRAMUSCULAR | Status: DC | PRN
  Administered 2015-10-14: 2 mg via INTRAVENOUS

## 2015-10-14 MED ORDER — EPHEDRINE SULFATE 50 MG/ML IJ SOLN
INTRAMUSCULAR | Status: DC | PRN
  Administered 2015-10-14: 10 mg via INTRAVENOUS

## 2015-10-14 MED ORDER — ONDANSETRON HCL 4 MG/2ML IJ SOLN
INTRAMUSCULAR | Status: AC
  Filled 2015-10-14: qty 2

## 2015-10-14 MED ORDER — ALBUTEROL SULFATE (2.5 MG/3ML) 0.083% IN NEBU
2.5000 mg | INHALATION_SOLUTION | Freq: Once | RESPIRATORY_TRACT | Status: AC
  Administered 2015-10-14: 2.5 mg via RESPIRATORY_TRACT

## 2015-10-14 MED ORDER — MORPHINE SULFATE (PF) 4 MG/ML IV SOLN: 2.0000 mg | INTRAVENOUS | Status: DC | PRN

## 2015-10-14 MED ORDER — DEXTROSE 50 % IV SOLN
12.5000 g | Freq: Once | INTRAVENOUS | Status: AC
  Administered 2015-10-14: 12.5 g via INTRAVENOUS

## 2015-10-14 MED ORDER — METOLAZONE 5 MG PO TABS
5.0000 mg | ORAL_TABLET | Freq: Two times a day (BID) | ORAL | Status: DC
  Administered 2015-10-14 – 2015-10-15 (×2): 5 mg via ORAL
  Filled 2015-10-14 (×2): qty 1

## 2015-10-14 MED ORDER — MIDAZOLAM HCL 2 MG/2ML IJ SOLN
INTRAMUSCULAR | Status: AC
  Filled 2015-10-14: qty 2

## 2015-10-14 MED ORDER — LACTATED RINGERS IV SOLN
INTRAVENOUS | Status: DC
  Administered 2015-10-14: 11:00:00 via INTRAVENOUS

## 2015-10-14 MED ORDER — SUCCINYLCHOLINE CHLORIDE 20 MG/ML IJ SOLN
INTRAMUSCULAR | Status: DC | PRN
  Administered 2015-10-14: 100 mg via INTRAVENOUS

## 2015-10-14 MED ORDER — PROPOFOL 10 MG/ML IV BOLUS
INTRAVENOUS | Status: DC | PRN
  Administered 2015-10-14: 100 mg via INTRAVENOUS

## 2015-10-14 MED ORDER — ALBUTEROL SULFATE (2.5 MG/3ML) 0.083% IN NEBU
INHALATION_SOLUTION | RESPIRATORY_TRACT | Status: AC
  Filled 2015-10-14: qty 3

## 2015-10-14 MED ORDER — ONDANSETRON HCL 4 MG/2ML IJ SOLN: 4.0000 mg | Freq: Once | INTRAMUSCULAR | Status: DC | PRN

## 2015-10-14 MED ORDER — FUROSEMIDE 10 MG/ML IJ SOLN
120.0000 mg | Freq: Two times a day (BID) | INTRAVENOUS | Status: DC
  Administered 2015-10-15: 120 mg via INTRAVENOUS
  Filled 2015-10-14 (×4): qty 12

## 2015-10-14 MED ORDER — FENTANYL CITRATE (PF) 100 MCG/2ML IJ SOLN
INTRAMUSCULAR | Status: DC | PRN
  Administered 2015-10-14 (×2): 25 ug via INTRAVENOUS
  Administered 2015-10-14: 50 ug via INTRAVENOUS

## 2015-10-14 MED ORDER — CHLORHEXIDINE GLUCONATE 4 % EX LIQD
1.0000 "application " | Freq: Once | CUTANEOUS | Status: AC
  Administered 2015-10-14: 1 via TOPICAL
  Filled 2015-10-14: qty 15

## 2015-10-14 MED ORDER — POVIDONE-IODINE 10 % EX OINT
TOPICAL_OINTMENT | CUTANEOUS | Status: AC
  Filled 2015-10-14: qty 1

## 2015-10-14 MED ORDER — FENTANYL CITRATE (PF) 100 MCG/2ML IJ SOLN
INTRAMUSCULAR | Status: AC
  Filled 2015-10-14: qty 2

## 2015-10-14 MED ORDER — FENTANYL CITRATE (PF) 100 MCG/2ML IJ SOLN: 25.0000 ug | INTRAMUSCULAR | Status: DC | PRN

## 2015-10-14 MED ORDER — POVIDONE-IODINE 10 % OINT PACKET
TOPICAL_OINTMENT | CUTANEOUS | Status: DC | PRN
  Administered 2015-10-14: 1 via TOPICAL

## 2015-10-14 MED ORDER — PROPOFOL 10 MG/ML IV BOLUS
INTRAVENOUS | Status: AC
  Filled 2015-10-14: qty 20

## 2015-10-14 MED ORDER — ONDANSETRON HCL 4 MG/2ML IJ SOLN
4.0000 mg | Freq: Once | INTRAMUSCULAR | Status: AC
  Administered 2015-10-14: 4 mg via INTRAVENOUS

## 2015-10-14 SURGICAL SUPPLY — 29 items
BAG HAMPER (MISCELLANEOUS) ×3 IMPLANT
BANDAGE ELASTIC 4 VELCRO NS (GAUZE/BANDAGES/DRESSINGS) ×3 IMPLANT
BANDAGE GAUZE ELAST BULKY 4 IN (GAUZE/BANDAGES/DRESSINGS) ×3 IMPLANT
BLADE SAW RECIP 87.9 MT (BLADE) ×3 IMPLANT
CLOTH BEACON ORANGE TIMEOUT ST (SAFETY) ×3 IMPLANT
COVER LIGHT HANDLE STERIS (MISCELLANEOUS) ×6 IMPLANT
ELECT REM PT RETURN 9FT ADLT (ELECTROSURGICAL) ×3
ELECTRODE REM PT RTRN 9FT ADLT (ELECTROSURGICAL) ×1 IMPLANT
FORMALIN 10 PREFIL 480ML (MISCELLANEOUS) ×3 IMPLANT
GAUZE SPONGE 4X4 12PLY STRL (GAUZE/BANDAGES/DRESSINGS) ×3 IMPLANT
GAUZE XEROFORM 5X9 LF (GAUZE/BANDAGES/DRESSINGS) ×6 IMPLANT
GLOVE BIOGEL PI IND STRL 7.0 (GLOVE) ×3 IMPLANT
GLOVE BIOGEL PI INDICATOR 7.0 (GLOVE) ×6
GLOVE ECLIPSE 6.5 STRL STRAW (GLOVE) ×3 IMPLANT
GLOVE SURG SS PI 7.5 STRL IVOR (GLOVE) ×3 IMPLANT
GOWN STRL REUS W/TWL LRG LVL3 (GOWN DISPOSABLE) ×6 IMPLANT
INST SET MINOR BONE (KITS) ×3 IMPLANT
KIT ROOM TURNOVER APOR (KITS) ×3 IMPLANT
MANIFOLD NEPTUNE II (INSTRUMENTS) ×3 IMPLANT
NS IRRIG 1000ML POUR BTL (IV SOLUTION) ×3 IMPLANT
PACK BASIC LIMB (CUSTOM PROCEDURE TRAY) ×3 IMPLANT
PAD ARMBOARD 7.5X6 YLW CONV (MISCELLANEOUS) ×3 IMPLANT
SET BASIN LINEN APH (SET/KITS/TRAYS/PACK) ×3 IMPLANT
SOL PREP PROV IODINE SCRUB 4OZ (MISCELLANEOUS) ×3 IMPLANT
SPONGE GAUZE 4X4 12PLY (GAUZE/BANDAGES/DRESSINGS) ×2 IMPLANT
SPONGE LAP 18X18 X RAY DECT (DISPOSABLE) ×3 IMPLANT
SUT PROLENE 3 0 PS 2 (SUTURE) ×12 IMPLANT
SUT VIC AB 3-0 SH 27 (SUTURE) ×2
SUT VIC AB 3-0 SH 27XBRD (SUTURE) ×1 IMPLANT

## 2015-10-14 NOTE — Progress Notes (Signed)
Subjective: Patient feeling better. He offers no new complaints.  Objective: Vital signs in last 24 hours: Temp:  [97.5 F (36.4 C)-98.5 F (36.9 C)] 97.5 F (36.4 C) (01/16 1224) Pulse Rate:  [58-83] 68 (01/16 1255) Resp:  [12-42] 16 (01/16 1255) BP: (83-162)/(54-90) 155/84 mmHg (01/16 1255) SpO2:  [95 %-100 %] 98 % (01/16 1255) Weight:  [223 lb (101.152 kg)] 223 lb (101.152 kg) (01/16 1019)  Intake/Output from previous day: 01/15 0701 - 01/16 0700 In: 1120 [P.O.:720; Blood:400] Out: -  Intake/Output this shift: Total I/O In: 550 [I.V.:550] Out: 25 [Blood:25]   Recent Labs  10/12/15 0555 10/13/15 0546 10/14/15 0510  HGB 8.5* 9.3* 10.1*    Recent Labs  10/13/15 0546 10/14/15 0510  WBC 3.3* 3.7*  RBC 2.91* 3.13*  HCT 27.3* 29.0*  PLT 51* 63*    Recent Labs  10/13/15 0546 10/14/15 0500  NA 139 141  K 4.2 4.3  CL 113* 112*  CO2 21* 22  BUN 36* 39*  CREATININE 3.08* 3.17*  GLUCOSE 168* 105*  CALCIUM 8.0* 8.2*    Recent Labs  10/12/15 0555  INR 1.59*    Generally patient is alert and in no apparent distress Chest: Decreased breath sound bilaterally Heart exam regular rate and rhythm no murmur Abdomen: Distended but nontender Extremities: He has 1+ edema on the right. He has dressing of his left foot.  Assessment/Plan: Problem #1 acute kidney injury his BUN and creatinine presently is improving. Possibly ATN and possibly vancomycin-induced kidney injury. Still his urine output is not documented. Patient says that he is making more urine however still Problem #2 hyperkalemia mild. Possibly a combination of high potassium intake/ renal failure/, Aldactone. His potassium has corrected. Problem #3 ascites: Stable Problem #4 anemia: This is a part of his pancytopenia. Problem #5 hypotension: His blood pressure has improved significantly. Problem #6 diabetic foot ulcer. Presently is open but no drainage. Problem #7 diabetes Plan: 1 ] will increase  Lasix to 80 mg IV twice a day 2] will check compressive metabolic panel in the morning. 3] will increase Lasix to 120 mg IV twice a day 4] Will add metal as on 5 mg by mouth twice a day 5] decrease IV fluid to 75 mL per hour  Ladona Rosten S 10/14/2015, 2:05 PM

## 2015-10-14 NOTE — Anesthesia Procedure Notes (Signed)
Procedure Name: Intubation Date/Time: 10/14/2015 11:32 AM Performed by: Pernell DupreADAMS, [name] A Pre-anesthesia Checklist: Patient identified, Patient being monitored, Timeout performed, Emergency Drugs available and Suction available Patient Re-evaluated:Patient Re-evaluated prior to inductionOxygen Delivery Method: Circle System Utilized Preoxygenation: Pre-oxygenation with 100% oxygen Intubation Type: IV induction, Rapid sequence and Cricoid Pressure applied Ventilation: Mask ventilation without difficulty Laryngoscope Size: 3 and Miller Grade View: Grade I Tube type: Oral Tube size: 7.0 mm Number of attempts: 1 Airway Equipment and Method: Stylet Placement Confirmation: ETT inserted through vocal cords under direct vision,  positive ETCO2 and breath sounds checked- equal and bilateral Secured at: 21 cm Tube secured with: Tape Dental Injury: Teeth and Oropharynx as per pre-operative assessment

## 2015-10-14 NOTE — Anesthesia Preprocedure Evaluation (Signed)
Anesthesia Evaluation  Patient identified by MRN, date of birth, ID band Patient awake    Reviewed: Allergy & Precautions, H&P , NPO status , Patient's Chart, lab work & pertinent test results  Airway Mallampati: II  TM Distance: >3 FB     Dental  (+) Edentulous Upper, Edentulous Lower   Pulmonary COPD, former smoker,    breath sounds clear to auscultation       Cardiovascular hypertension, Pt. on medications  Rhythm:Regular Rate:Normal     Neuro/Psych    GI/Hepatic negative GI ROS, (+) Cirrhosis  (portal htn)  ascites    ,   Endo/Other  diabetes, Well Controlled, Type 2, Insulin DependentHypothyroidism   Renal/GU Renal InsufficiencyRenal disease     Musculoskeletal   Abdominal   Peds  Hematology  (+) Blood dyscrasia, ,   Anesthesia Other Findings   Reproductive/Obstetrics                             Anesthesia Physical Anesthesia Plan  ASA: IV  Anesthesia Plan: General   Post-op Pain Management:    Induction: Intravenous, Rapid sequence and Cricoid pressure planned  Airway Management Planned: Oral ETT  Additional Equipment:   Intra-op Plan:   Post-operative Plan: Extubation in OR  Informed Consent: I have reviewed the patients History and Physical, chart, labs and discussed the procedure including the risks, benefits and alternatives for the proposed anesthesia with the patient or authorized representative who has indicated his/her understanding and acceptance.     Plan Discussed with:   Anesthesia Plan Comments:         Anesthesia Quick Evaluation

## 2015-10-14 NOTE — Op Note (Signed)
Patient:  [name] [name]  DOB:  [birth year]  MRN:  [id number]   Preop Diagnosis:  Gangrene, left great toe  Postop Diagnosis:  Same  Procedure:  Amputation of left great toe  Surgeon:  Franky MachoMark Berda Shelvin, M.D.  Anes:  Gen.  Indications:  Patient is a 65 year old white male multiple medical problems who presents with gangrene of the left great toe. The risks and benefits of the procedure were fully explained to the patient, who gave informed consent.  Procedure note:  The patient was placed the supine position. After general anesthesia was administered, the left lower extremity was prepped and draped using usual sterile technique with Betadine. Surgical site confirmation was performed.  An elliptical incision was made around the base of the left great toe, extending along the medial aspect over the metatarsal head joint. The dissection was taken down to the metatarsal head. The left great toe as well as metatarsal head were resected in total without difficulty. The toe was sent to pathology further examination. A bleeding was controlled using Bovie electrocautery. The wound was irrigated normal saline. Edematous tissue was found, but no frank purulence was found. Xeroform was placed in the base of the wound. The skin was reapproximated using 3-0 nylon interrupted sutures. Betadine ointment and a dry sterile dressing were applied.  All tape and needle counts were correct the end of the procedure. Patient was awakened and transferred to PACU in stable condition.  Complications:  None  EBL:  Minimal  Specimen:  Left great toe

## 2015-10-14 NOTE — Transfer of Care (Signed)
Immediate Anesthesia Transfer of Care Note  Patient: [name] [name]  Procedure(s) Performed: Procedure(s): AMPUTATION LEFT GREAT TOE  (Left)  Patient Location: PACU  Anesthesia Type:General  Level of Consciousness: awake, alert , oriented and patient cooperative  Airway & Oxygen Therapy: Patient Spontanous Breathing  Post-op Assessment: Report given to RN and Post -op Vital signs reviewed and stable  Post vital signs: Reviewed and stable  Last Vitals:  Filed Vitals:   10/14/15 1100 10/14/15 1105  BP: 134/71 128/76  Pulse:    Temp:    Resp: 13 15    Complications: No apparent anesthesia complications

## 2015-10-14 NOTE — Progress Notes (Signed)
[facility] PROGRESS NOTE  [name] [name] ZOX:[id number] DOB: 11-17-1950 DOA: 10/08/2015 PCP: [name], PA-C  Assessment/Plan: Diabetic foot infection/osteomyelitis -Plain films confirm osteomyelitis of the distal phalanx of the left big toe. -Will DC broad spectrum abx and transition to keflex. -For amputation today by Dr. Lovell Sheehan.  Hypothyroidism -Continue Synthroid  Diabetes mellitus -Well controlled, continue current regimen  Acute on chronic kidney disease stage IV -Creatinine is slightly worsened from baseline of 2.4-2.5. -Continue gentle hydration. -Continue to monitor. -Check renal function in am. -Appreciate nephrology input and recommendations.  Pancytopenia -Chronic, likely related to history of cirrhosis. -Continue outpatient follow-up with GI for cirrhosis. -Receiving PRBCs in anticipation of OR in am as per surgery recommendations.  Code Status: Full code Family Communication: Wife at bedside updated on plan of care and all questions answered  Disposition Plan: To be determined   Consultants:  Surgery   Antibiotics:  Keflex  Subjective: No issues or complaints.  Objective: Filed Vitals:   10/14/15 1224 10/14/15 1230 10/14/15 1245 10/14/15 1255  BP: 138/73 132/82 150/77 155/84  Pulse: 73 72 67 68  Temp: 97.5 F (36.4 C)     TempSrc:      Resp: 19 17 15 16   Height:      Weight:      SpO2: 97% 98% 97% 98%    Intake/Output Summary (Last 24 hours) at 10/14/15 1403 Last data filed at 10/14/15 1225  Gross per 24 hour  Intake   1190 ml  Output     25 ml  Net   1165 ml   Filed Weights   10/08/15 1413 10/08/15 1844 10/14/15 1019  Weight: 104.327 kg (230 lb) 101.243 kg (223 lb 3.2 oz) 101.152 kg (223 lb)    Exam:   General:  Alert, awake, oriented 3  Cardiovascular: Regular rate and rhythm  Respiratory: Clear to auscultation bilaterally  Abdomen: Soft, nontender, nondistended, positive bowel sounds  Extremities:  Right: No clubbing, cyanosis or edema. Left: dressed in clean dressing.  Neurologic:  Grossly intact and nonfocal   Data Reviewed: Basic Metabolic Panel:  Recent Labs Lab 10/10/15 0544 10/11/15 0543 10/12/15 0555 10/13/15 0546 10/14/15 0500  NA 139 139 140 139 141  K 4.8 5.1 5.4* 4.2 4.3  CL 111 112* 114* 113* 112*  CO2 23 24 22  21* 22  GLUCOSE 94 122* 97 168* 105*  BUN 35* 33* 34* 36* 39*  CREATININE 2.93* 3.05* 2.92* 3.08* 3.17*  CALCIUM 8.4* 8.1* 8.0* 8.0* 8.2*   Liver Function Tests:  Recent Labs Lab 10/09/15 0524 10/10/15 0544 10/13/15 0546  AST 32 27 31  ALT 17 16* 16*  ALKPHOS 114 97 99  BILITOT 0.9 1.0 1.9*  PROT 5.5* 5.6* 5.3*  ALBUMIN 2.4* 2.4* 2.3*   No results for input(s): LIPASE, AMYLASE in the last 168 hours. No results for input(s): AMMONIA in the last 168 hours. CBC:  Recent Labs Lab 10/08/15 1549  10/10/15 0544 10/11/15 0543 10/12/15 0555 10/13/15 0546 10/14/15 0510  WBC 3.8*  < > 2.7* 4.1 3.4* 3.3* 3.7*  NEUTROABS 2.4  --   --   --   --   --   --   HGB 10.3*  < > 8.9* 8.4* 8.5* 9.3* 10.1*  HCT 29.7*  < > 26.5* 25.1* 25.1* 27.3* 29.0*  MCV 96.4  < > 96.7 96.9 96.9 93.8 92.7  PLT 88*  < > 68* 67* 56* 51* 63*  < > = values in this  interval not displayed. Cardiac Enzymes: No results for input(s): CKTOTAL, CKMB, CKMBINDEX, TROPONINI in the last 168 hours. BNP (last 3 results) No results for input(s): BNP in the last 8760 hours.  ProBNP (last 3 results) No results for input(s): PROBNP in the last 8760 hours.  CBG:  Recent Labs Lab 10/13/15 2052 10/14/15 0749 10/14/15 0941 10/14/15 1040 10/14/15 1231  GLUCAP 104* 86 76 94 87    Recent Results (from the past 240 hour(s))  Wound culture     Status: None   Collection Time: 10/08/15  3:00 PM  Result Value Ref Range Status   Specimen Description ABSCESS  Final   Special Requests Immunocompromised  Final   Gram Stain   Final    RARE WBC PRESENT, PREDOMINANTLY PMN NO SQUAMOUS  EPITHELIAL CELLS SEEN ABUNDANT GRAM POSITIVE COCCI IN PAIRS Performed at [facility]    Culture   Final    MULTIPLE ORGANISMS PRESENT, NONE PREDOMINANT Note: NO STAPHYLOCOCCUS AUREUS ISOLATED NO GROUP A STREP (S.PYOGENES) ISOLATED Performed at [facility]    Report Status 10/11/2015 FINAL  Final  Culture, blood (Routine X 2) w Reflex to ID Panel     Status: None   Collection Time: 10/08/15  3:49 PM  Result Value Ref Range Status   Specimen Description BLOOD LEFT ARM  Final   Special Requests BOTTLES DRAWN AEROBIC AND ANAEROBIC 6CC  Final   Culture NO GROWTH 5 DAYS  Final   Report Status 10/13/2015 FINAL  Final  Culture, blood (Routine X 2) w Reflex to ID Panel     Status: None   Collection Time: 10/08/15  3:53 PM  Result Value Ref Range Status   Specimen Description BLOOD LEFT HAND  Final   Special Requests BOTTLES DRAWN AEROBIC AND ANAEROBIC 6CC  Final   Culture NO GROWTH 5 DAYS  Final   Report Status 10/13/2015 FINAL  Final  MRSA PCR Screening     Status: None   Collection Time: 10/14/15  5:45 AM  Result Value Ref Range Status   MRSA by PCR NEGATIVE NEGATIVE Final    Comment:        The GeneXpert MRSA Assay (FDA approved for NASAL specimens only), is one component of a comprehensive MRSA colonization surveillance program. It is not intended to diagnose MRSA infection nor to guide or monitor treatment for MRSA infections.      Studies: No results found.  Scheduled Meds: . cephALEXin  500 mg Oral 3 times per day  . enoxaparin (LOVENOX) injection  30 mg Subcutaneous Q24H  . furosemide  120 mg Intravenous BID  . gabapentin  800 mg Oral BID  . insulin aspart  0-9 Units Subcutaneous TID WC  . insulin glargine  15 Units Subcutaneous QHS  . levothyroxine  150 mcg Oral QAC breakfast  . metolazone  5 mg Oral BID  . nadolol  40 mg Oral Daily  . pantoprazole  40 mg Oral Daily  . rOPINIRole  0.5 mg Oral QHS  . tamsulosin  0.4 mg Oral QHS  . tiZANidine   4 mg Oral QHS   Continuous Infusions: . sodium chloride 100 mL/hr at 10/14/15 0542    Active Problems:   Diabetes mellitus type 2, uncontrolled (HCC)   Hypothyroidism   Cirrhosis (HCC)   Osteomyelitis (HCC)   Diabetic foot infection (HCC)    Time spent: 15 minutes.Greater than 50% of this time was spent in direct contact with the patient coordinating care.    HERNANDEZ  [facility]  [facility] Pager [phone]  If 7PM-7AM, please contact night-coverage at [email or web address], password [name] 10/14/2015, 2:03 PM  LOS: 6 days

## 2015-10-14 NOTE — Progress Notes (Signed)
Notified Barbra in the OR receiving nurse for the patient that the patient had not received his Nadalol that due to his BP.  This am his BP 83/60 HR 60.  Voiced that I was waiting on a call from Dr. Ardyth Harps to see if it should be given and asked her to discuss with Dr. Lovell Sheehan.  Vocied to her that the patient had not had the medication since Friday and that I had notified Dr. Ardyth Harps of this.  Barbra verbalized  Understanding.

## 2015-10-14 NOTE — Anesthesia Postprocedure Evaluation (Signed)
Anesthesia Post Note  Patient: [name] [name]  Procedure(s) Performed: Procedure(s) (LRB): AMPUTATION LEFT GREAT TOE  (Left)  Patient location during evaluation: PACU Anesthesia Type: General Level of consciousness: awake and awake and alert Pain management: pain level controlled Vital Signs Assessment: post-procedure vital signs reviewed and stable Respiratory status: spontaneous breathing Cardiovascular status: blood pressure returned to baseline Anesthetic complications: no    Last Vitals:  Filed Vitals:   10/14/15 1245 10/14/15 1255  BP: 150/77 155/84  Pulse: 67 68  Temp:    Resp: 15 16    Last Pain:  Filed Vitals:   10/14/15 1313  PainSc: 0-No pain                 Aidric Endicott

## 2015-10-15 LAB — PREPARE PLATELET PHERESIS
Unit division: 0
Unit division: 0

## 2015-10-15 LAB — CBC
HEMATOCRIT: 32.6 % — AB (ref 39.0–52.0)
HEMOGLOBIN: 11.2 g/dL — AB (ref 13.0–17.0)
MCH: 32 pg (ref 26.0–34.0)
MCHC: 34.4 g/dL (ref 30.0–36.0)
MCV: 93.1 fL (ref 78.0–100.0)
Platelets: 96 10*3/uL — ABNORMAL LOW (ref 150–400)
RBC: 3.5 MIL/uL — ABNORMAL LOW (ref 4.22–5.81)
RDW: 15.3 % (ref 11.5–15.5)
WBC: 7.3 10*3/uL (ref 4.0–10.5)

## 2015-10-15 LAB — GLUCOSE, CAPILLARY
GLUCOSE-CAPILLARY: 163 mg/dL — AB (ref 65–99)
Glucose-Capillary: 162 mg/dL — ABNORMAL HIGH (ref 65–99)

## 2015-10-15 LAB — BASIC METABOLIC PANEL
ANION GAP: 7 (ref 5–15)
BUN: 35 mg/dL — ABNORMAL HIGH (ref 6–20)
CALCIUM: 8.8 mg/dL — AB (ref 8.9–10.3)
CHLORIDE: 106 mmol/L (ref 101–111)
CO2: 25 mmol/L (ref 22–32)
Creatinine, Ser: 2.9 mg/dL — ABNORMAL HIGH (ref 0.61–1.24)
GFR calc Af Amer: 24 mL/min — ABNORMAL LOW (ref >60)
GFR calc non Af Amer: 21 mL/min — ABNORMAL LOW (ref >60)
GLUCOSE: 131 mg/dL — AB (ref 65–99)
POTASSIUM: 4.1 mmol/L (ref 3.5–5.1)
Sodium: 138 mmol/L (ref 135–145)

## 2015-10-15 MED ORDER — TORSEMIDE 20 MG PO TABS: 40.0000 mg | ORAL_TABLET | Freq: Every day | ORAL | Status: DC

## 2015-10-15 MED ORDER — OXYCODONE HCL 5 MG PO TABS: 5.0000 mg | ORAL_TABLET | ORAL | Status: DC | PRN

## 2015-10-15 NOTE — Anesthesia Postprocedure Evaluation (Signed)
Anesthesia Post Note  Patient: [name] [name]  Procedure(s) Performed: Procedure(s) (LRB): AMPUTATION LEFT GREAT TOE  (Left)  Patient location during evaluation: Nursing Unit Anesthesia Type: General Level of consciousness: awake and alert and oriented Pain management: pain level controlled Vital Signs Assessment: post-procedure vital signs reviewed and stable Respiratory status: spontaneous breathing and respiratory function stable Cardiovascular status: stable Postop Assessment: no signs of nausea or vomiting Anesthetic complications: no    Last Vitals:  Filed Vitals:   10/14/15 2345 10/15/15 0626  BP: 129/59 101/64  Pulse: 86 67  Temp: 36.9 C 36.6 C  Resp: 16 18    Last Pain:  Filed Vitals:   10/15/15 0627  PainSc: 0-No pain                 Ras Kollman A

## 2015-10-15 NOTE — Care Management Important Message (Signed)
Important Message  Patient Details  Name: KEINAN BROUILLET MRN: [id number] Date of Birth: 1951/02/04   Medicare Important Message Given:  Yes    Malcolm Metro, RN 10/15/2015, 11:32 AM

## 2015-10-15 NOTE — Progress Notes (Signed)
Subjective: Patient denies any fever or chills or sweating. He denies also any difficulty breathing.  Objective: Vital signs in last 24 hours: Temp:  [97.4 F (36.3 C)-98.4 F (36.9 C)] 97.9 F (36.6 C) (01/17 0626) Pulse Rate:  [60-86] 67 (01/17 0626) Resp:  [12-42] 18 (01/17 0626) BP: (83-162)/(59-96) 101/64 mmHg (01/17 0626) SpO2:  [95 %-100 %] 100 % (01/17 0626) Weight:  [223 lb (101.152 kg)] 223 lb (101.152 kg) (01/16 1019)  Intake/Output from previous day: 01/16 0701 - 01/17 0700 In: 1324 [P.O.:480; I.V.:550; Blood:294] Out: 1675 [Urine:1650; Blood:25] Intake/Output this shift:     Recent Labs  10/13/15 0546 10/14/15 0510 10/15/15 0235  HGB 9.3* 10.1* 11.2*    Recent Labs  10/14/15 0510 10/15/15 0235  WBC 3.7* 7.3  RBC 3.13* 3.50*  HCT 29.0* 32.6*  PLT 63* 96*    Recent Labs  10/14/15 0500 10/15/15 0235  NA 141 138  K 4.3 4.1  CL 112* 106  CO2 22 25  BUN 39* 35*  CREATININE 3.17* 2.90*  GLUCOSE 105* 131*  CALCIUM 8.2* 8.8*   No results for input(s): LABPT, INR in the last 72 hours.  Generally patient is alert and in no apparent distress Chest: Decreased breath sound bilaterally Heart exam regular rate and rhythm no murmur Abdomen: Distended but nontender Extremities: He has 1+ edema on the right. He has dressing of his left foot.  Assessment/Plan: Problem #1 acute kidney injury his BUN and creatinine presently is improving. Possibly ATN and possibly vancomycin-induced kidney injury. Patient had about 1600 mL of urine output. His renal function is presently improving. Patient does not have any nausea or vomiting. Problem #2 hyperkalemia mild. Possibly a combination of high potassium intake/ renal failure/, Aldactone. His potassium has corrected. Problem #3 ascites: Stable Problem #4 anemia: His hemoglobin is within acceptable range. Problem #5 hypotension: His blood pressure is within normal range. Problem #6 diabetic foot ulcer. Presently is  open but no drainage. Problem #7 diabetes Plan: 1 ] will continue his present management 2] we'll check his basic metabolic panel in the morning.  [name] [name] S 10/15/2015, 7:44 AM

## 2015-10-15 NOTE — Care Management Note (Signed)
Case Management Note  Patient Details  Name: [name] [name] MRN: [id number] Date of Birth: [birth year]   Expected Discharge Date:  10/10/15               Expected Discharge Plan:  Home w Home Health Services  In-House Referral:  NA  Discharge planning Services  CM Consult  Post Acute Care Choice:  Home Health, Durable Medical Equipment Choice offered to:  Patient  DME Arranged:  Walker rolling DME Agency:  [facility]  HH Arranged:  PT [facility] Agency:  [facility]  Status of Service:  Completed, signed off  Medicare Important Message Given:  Yes Date Medicare IM Given:    Medicare IM give by:    Date Additional Medicare IM Given:    Additional Medicare Important Message give by:     If discussed at Long Length of Stay Meetings, dates discussed:    Additional Comments: PT has recommended HH PT and RW. Pt states he does not already have RW. Pt requests [facility] for all HH and DME needs. Pt's wife is at the bedside. Alroy Bailiff, of [facility], made aware of referral and will obtain pt info from chart and deliver RW to pt's room prior to DC. Family made aware HH has 48 hours to initiate services. No further CM needs.   Malcolm Metro, RN 10/15/2015, 12:56 PM

## 2015-10-15 NOTE — Progress Notes (Signed)
1 Day Post-Op  Subjective: Denies any pain.  Objective: Vital signs in last 24 hours: Temp:  [97.4 F (36.3 C)-98.4 F (36.9 C)] 97.9 F (36.6 C) (01/17 0626) Pulse Rate:  [60-86] 67 (01/17 0626) Resp:  [12-42] 18 (01/17 0626) BP: (83-162)/(59-96) 101/64 mmHg (01/17 0626) SpO2:  [95 %-100 %] 100 % (01/17 0626) Weight:  [101.152 kg (223 lb)] 101.152 kg (223 lb) (01/16 1019) Last BM Date: 10/10/15  Intake/Output from previous day: 01/16 0701 - 01/17 0700 In: 1324 [P.O.:480; I.V.:550; Blood:294] Out: 1675 [Urine:1650; Blood:25] Intake/Output this shift:    General appearance: alert, cooperative and no distress Extremities: left foot dressing intact.  Lab Results:   Recent Labs  10/14/15 0510 10/15/15 0235  WBC 3.7* 7.3  HGB 10.1* 11.2*  HCT 29.0* 32.6*  PLT 63* 96*   BMET  Recent Labs  10/14/15 0500 10/15/15 0235  NA 141 138  K 4.3 4.1  CL 112* 106  CO2 22 25  GLUCOSE 105* 131*  BUN 39* 35*  CREATININE 3.17* 2.90*  CALCIUM 8.2* 8.8*   PT/INR No results for input(s): LABPROT, INR in the last 72 hours.  Studies/Results: No results found.  Anti-infectives: Anti-infectives    Start     Dose/Rate Route Frequency Ordered Stop   10/14/15 1200  vancomycin (VANCOCIN) 1,750 mg in sodium chloride 0.9 % 500 mL IVPB  Status:  Discontinued     1,750 mg 250 mL/hr over 120 Minutes Intravenous Every 48 hours 10/13/15 0806 10/13/15 1229   10/13/15 1400  cephALEXin (KEFLEX) capsule 500 mg     500 mg Oral 3 times per day 10/13/15 1230     10/09/15 2000  vancomycin (VANCOCIN) 1,250 mg in sodium chloride 0.9 % 250 mL IVPB  Status:  Discontinued     1,250 mg 166.7 mL/hr over 90 Minutes Intravenous Every 24 hours 10/08/15 1901 10/13/15 0746   10/08/15 2000  vancomycin (VANCOCIN) IVPB 1000 mg/200 mL premix     1,000 mg 200 mL/hr over 60 Minutes Intravenous  Once 10/08/15 1901 10/08/15 2148   10/08/15 1930  piperacillin-tazobactam (ZOSYN) IVPB 3.375 g  Status:   Discontinued     3.375 g 12.5 mL/hr over 240 Minutes Intravenous Every 8 hours 10/08/15 1901 10/13/15 1229   10/08/15 1500  vancomycin (VANCOCIN) IVPB 1000 mg/200 mL premix     1,000 mg 200 mL/hr over 60 Minutes Intravenous  Once 10/08/15 1457 10/08/15 1734      Assessment/Plan: s/p Procedure(s): AMPUTATION LEFT GREAT TOE  impression: Stable, status post left great toe amputation  Plan: Okay for discharge from surgery standpoint. Will see patient in my office in 2 days for a dressing change. Patient has been instructed on nonweightbearing on the ball of his left foot.  LOS: 7 days    [name] [name] A 10/15/2015

## 2015-10-15 NOTE — Addendum Note (Signed)
Addendum  created 10/15/15 0959 by Earleen Newport, CRNA   Modules edited: Clinical Notes   Clinical Notes:  File: [id number]

## 2015-10-15 NOTE — Progress Notes (Signed)
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  Per hospital policy blood was drawn for the exposure panel. " 

## 2015-10-15 NOTE — Plan of Care (Signed)
Problem: Bowel/Gastric: Goal: Will not experience complications related to bowel motility Outcome: Not Progressing Pt has not had a bowel movement since 10/10/2015.

## 2015-10-15 NOTE — Discharge Instructions (Signed)
Do not put weight on left foot toes.  Use heel only.
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
## 2015-10-15 NOTE — Discharge Summary (Signed)
Physician Discharge Summary  [name] [name] WUJ:[id number] DOB: 1951/06/15 DOA: 10/08/2015  PCP: [name], PA-C  Admit date: 10/08/2015 Discharge date: 10/15/2015  Time spent: 45 minutes  Recommendations for Outpatient Follow-up:  -We'll be discharged home today. -Has appointment to see Dr. Lovell Sheehan in 2 days for continued wound care, will also need to follow with Dr. Fausto Skillern in 4 weeks for continued management of his chronic kidney disease.   Discharge Diagnoses:  Active Problems:   Diabetes mellitus type 2, uncontrolled (HCC)   Hypothyroidism   Cirrhosis (HCC)   Osteomyelitis (HCC)   Diabetic foot infection (HCC)   Discharge Condition: Stable and improved  Filed Weights   10/08/15 1413 10/08/15 1844 10/14/15 1019  Weight: 104.327 kg (230 lb) 101.243 kg (223 lb 3.2 oz) 101.152 kg (223 lb)    History of present illness:  As per Dr. Sharl Ma on 1/10: 65 year old male who  has a past medical history of Diabetes mellitus; Thyroid disease; High cholesterol; Hypertension; Cirrhosis of liver (HCC); Splenomegaly; Portal hypertension (HCC); Thrombocytopenia (HCC); COPD (chronic obstructive pulmonary disease) (HCC); and Hypothyroidism. Today presents to the hospital with chief complaint of left foot pain and swelling which started about 2 weeks ago. Patient says that his left foot got and when he took off the socks it was infected. His son-in-law took off the nail and since then the foot has been getting worse with foul-smelling discharge noted today. Patient was diagnosed with positive HIV 3 years ago but viral load was negative with normal CD4 count. Patient was told that he had no HIV at that time. He denies fever or chills. No chest pain or shortness of breath.  In the ED exit of the foot showed osteomyelitis with pathological fracture of the left big toe   Hospital Course:   Diabetic foot infection/osteomyelitis -Plain films confirm osteomyelitis of the distal phalanx of the  left big toe. -Status post amputation of left great toe. -Per Dr. Lovell Sheehan, no further need for antibiotics.  Hypothyroidism -Continue Synthroid  Diabetes mellitus -Well controlled, continue current regimen  Acute on chronic kidney disease stage IV -Creatinine is slightly worsened from baseline of 2.4-2.5. -Discussed with Dr. Fausto Skillern, will plan Demadex 40 mg daily and will need to follow-up with him in the office in 4 weeks.  Pancytopenia -Chronic, likely related to history of cirrhosis. -Continue outpatient follow-up with GI for cirrhosis. -Received 3 units of PRBCs in anticipation of OR in am as per surgery recommendations.    Procedures:  Left big toe amputation   Consultations:  Surgery  Nephrology  Discharge Instructions      Discharge Instructions    Diet - low sodium heart healthy    Complete by:  As directed      Increase activity slowly    Complete by:  As directed             Medication List    STOP taking these medications        levofloxacin 750 MG tablet  Commonly known as:  LEVAQUIN     meloxicam 7.5 MG tablet  Commonly known as:  MOBIC     metFORMIN 1000 MG tablet  Commonly known as:  GLUCOPHAGE     ramipril 5 MG capsule  Commonly known as:  ALTACE     spironolactone 50 MG tablet  Commonly known as:  ALDACTONE      TAKE these medications        ALPRAZolam 0.25 MG tablet  Commonly  known as:  XANAX  Take 1 tablet (0.25 mg total) by mouth at bedtime as needed for sleep (Do not take along with pain pills.).     gabapentin 800 MG tablet  Commonly known as:  NEURONTIN  Take 800 mg by mouth 2 (two) times daily.     insulin aspart 100 UNIT/ML injection  Commonly known as:  novoLOG  Inject 3 Units into the skin 3 (three) times daily with meals.     insulin glargine 100 UNIT/ML injection  Commonly known as:  LANTUS  Inject 0.15 mLs (15 Units total) into the skin at bedtime.     levothyroxine 150 MCG tablet  Commonly known as:   SYNTHROID, LEVOTHROID  Take 150 mcg by mouth daily before breakfast.     Magnesium 400 MG Caps  Take 400 mg elemental calcium/kg/hr by mouth daily.     nadolol 40 MG tablet  Commonly known as:  CORGARD  Take 1 tablet (40 mg total) by mouth daily.     omeprazole 20 MG capsule  Commonly known as:  PRILOSEC  Take 20 mg by mouth daily.     oxyCODONE 5 MG immediate release tablet  Commonly known as:  Oxy IR/ROXICODONE  Take 1 tablet (5 mg total) by mouth every 4 (four) hours as needed for moderate pain.     PARoxetine 10 MG tablet  Commonly known as:  PAXIL  Take 10 mg by mouth daily.     PROAIR HFA 108 (90 Base) MCG/ACT inhaler  Generic drug:  albuterol  Inhale 1-2 puffs into the lungs every 6 (six) hours as needed for wheezing or shortness of breath.     rOPINIRole 0.5 MG tablet  Commonly known as:  REQUIP  Take 0.5 mg by mouth at bedtime.     simvastatin 80 MG tablet  Commonly known as:  ZOCOR  Take 80 mg by mouth at bedtime.     tamsulosin 0.4 MG Caps capsule  Commonly known as:  FLOMAX  Take 0.4 mg by mouth at bedtime.     tiZANidine 4 MG tablet  Commonly known as:  ZANAFLEX  Take 4 mg by mouth at bedtime.     torsemide 20 MG tablet  Commonly known as:  DEMADEX  Take 2 tablets (40 mg total) by mouth daily.       No Known Allergies Follow-up Information    Follow up with Dalia Heading, MD. Schedule an appointment as soon as possible for a visit on 10/17/2015.   Specialty:  General Surgery   Contact information:   [address] [place] [address] [phone]       Follow up with [facility] S, MD. Schedule an appointment as soon as possible for a visit in 4 weeks.   Specialty:  Nephrology   Contact information:   [address] [place] [address] [phone]        The results of significant diagnostics from this hospitalization (including imaging, microbiology, ancillary and laboratory) are listed below for reference.      Significant Diagnostic Studies: Dg Chest 2 View  10/08/2015  CLINICAL DATA:  Cough EXAM: CHEST - 2 VIEW COMPARISON:  08/15/2015 FINDINGS: Cardiac shadow is stable. Mild interstitial changes are again seen bilaterally but improved from the prior study. No focal infiltrate or sizable effusion is seen. No bony abnormality is noted. IMPRESSION: No acute abnormality noted. Electronically Signed   By: Alcide Clever M.D.   On: 10/08/2015 16:27   US Venous Img Lower  Unilateral Left  10/08/2015  CLINICAL DATA:  Infected left toe.  Evaluate for DVT. EXAM: LEFT LOWER EXTREMITY VENOUS DOPPLER ULTRASOUND TECHNIQUE: Gray-scale sonography with graded compression, as well as color Doppler and duplex ultrasound were performed to evaluate the lower extremity deep venous systems from the level of the common femoral vein and including the common femoral, femoral, profunda femoral, popliteal and calf veins including the posterior tibial, peroneal and gastrocnemius veins when visible. The superficial great saphenous vein was also interrogated. Spectral Doppler was utilized to evaluate flow at rest and with distal augmentation maneuvers in the common femoral, femoral and popliteal veins. COMPARISON:  None. FINDINGS: Contralateral Common Femoral Vein: Respiratory phasicity is normal and symmetric with the symptomatic side. No evidence of thrombus. Normal compressibility. Common Femoral Vein: No evidence of thrombus. Normal compressibility, respiratory phasicity and response to augmentation. Saphenofemoral Junction: No evidence of thrombus. Normal compressibility and flow on color Doppler imaging. Profunda Femoral Vein: No evidence of thrombus. Normal compressibility and flow on color Doppler imaging. Femoral Vein: No evidence of thrombus. Normal compressibility, respiratory phasicity and response to augmentation. Popliteal Vein: No evidence of thrombus. Normal compressibility, respiratory phasicity and response to augmentation.  Calf Veins: No evidence of thrombus. Normal compressibility and flow on color Doppler imaging. Superficial Great Saphenous Vein: No evidence of thrombus. Normal compressibility and flow on color Doppler imaging. Venous Reflux:  None. Other Findings: A moderate amount of subcutaneous edema is noted at the level of the left lower leg and calf (representative images 32 through 34). IMPRESSION: No evidence of DVT within the left lower extremity. Electronically Signed   By: Simonne Come M.D.   On: 10/08/2015 16:52   Dg Toe Great Left  10/08/2015  CLINICAL DATA:  Pain and swelling for 1 week.  Open wound. EXAM: LEFT GREAT TOE COMPARISON:  None. FINDINGS: There is a pathologic fracture involving the distal phalanx of the great toe due to destructive bony changes of osteomyelitis. Moderate degenerative changes involving the interphalangeal joint but no findings for septic arthritis. The other joint spaces are maintained. IMPRESSION: Osteomyelitis and pathologic fracture involving the distal aspect of the distal phalanx of the great toe with significant surrounding soft tissue swelling and tissue loss. Electronically Signed   By: Rudie Meyer M.D.   On: 10/08/2015 16:26    Microbiology: Recent Results (from the past 240 hour(s))  Wound culture     Status: None   Collection Time: 10/08/15  3:00 PM  Result Value Ref Range Status   Specimen Description ABSCESS  Final   Special Requests Immunocompromised  Final   Gram Stain   Final    RARE WBC PRESENT, PREDOMINANTLY PMN NO SQUAMOUS EPITHELIAL CELLS SEEN ABUNDANT GRAM POSITIVE COCCI IN PAIRS Performed at [facility]    Culture   Final    MULTIPLE ORGANISMS PRESENT, NONE PREDOMINANT Note: NO STAPHYLOCOCCUS AUREUS ISOLATED NO GROUP A STREP (S.PYOGENES) ISOLATED Performed at [facility]    Report Status 10/11/2015 FINAL  Final  Culture, blood (Routine X 2) w Reflex to ID Panel     Status: None   Collection Time: 10/08/15  3:49 PM  Result  Value Ref Range Status   Specimen Description BLOOD LEFT ARM  Final   Special Requests BOTTLES DRAWN AEROBIC AND ANAEROBIC 6CC  Final   Culture NO GROWTH 5 DAYS  Final   Report Status 10/13/2015 FINAL  Final  Culture, blood (Routine X 2) w Reflex to ID Panel     Status: None  Collection Time: 10/08/15  3:53 PM  Result Value Ref Range Status   Specimen Description BLOOD LEFT HAND  Final   Special Requests BOTTLES DRAWN AEROBIC AND ANAEROBIC 6CC  Final   Culture NO GROWTH 5 DAYS  Final   Report Status 10/13/2015 FINAL  Final  MRSA PCR Screening     Status: None   Collection Time: 10/14/15  5:45 AM  Result Value Ref Range Status   MRSA by PCR NEGATIVE NEGATIVE Final    Comment:        The GeneXpert MRSA Assay (FDA approved for NASAL specimens only), is one component of a comprehensive MRSA colonization surveillance program. It is not intended to diagnose MRSA infection nor to guide or monitor treatment for MRSA infections.      Labs: Basic Metabolic Panel:  Recent Labs Lab 10/11/15 0543 10/12/15 0555 10/13/15 0546 10/14/15 0500 10/15/15 0235  NA 139 140 139 141 138  K 5.1 5.4* 4.2 4.3 4.1  CL 112* 114* 113* 112* 106  CO2 24 22 21* 22 25  GLUCOSE 122* 97 168* 105* 131*  BUN 33* 34* 36* 39* 35*  CREATININE 3.05* 2.92* 3.08* 3.17* 2.90*  CALCIUM 8.1* 8.0* 8.0* 8.2* 8.8*   Liver Function Tests:  Recent Labs Lab 10/09/15 0524 10/10/15 0544 10/13/15 0546  AST 32 27 31  ALT 17 16* 16*  ALKPHOS 114 97 99  BILITOT 0.9 1.0 1.9*  PROT 5.5* 5.6* 5.3*  ALBUMIN 2.4* 2.4* 2.3*   No results for input(s): LIPASE, AMYLASE in the last 168 hours. No results for input(s): AMMONIA in the last 168 hours. CBC:  Recent Labs Lab 10/08/15 1549  10/11/15 0543 10/12/15 0555 10/13/15 0546 10/14/15 0510 10/15/15 0235  WBC 3.8*  < > 4.1 3.4* 3.3* 3.7* 7.3  NEUTROABS 2.4  --   --   --   --   --   --   HGB 10.3*  < > 8.4* 8.5* 9.3* 10.1* 11.2*  HCT 29.7*  < > 25.1* 25.1*  27.3* 29.0* 32.6*  MCV 96.4  < > 96.9 96.9 93.8 92.7 93.1  PLT 88*  < > 67* 56* 51* 63* 96*  < > = values in this interval not displayed. Cardiac Enzymes: No results for input(s): CKTOTAL, CKMB, CKMBINDEX, TROPONINI in the last 168 hours. BNP: BNP (last 3 results) No results for input(s): BNP in the last 8760 hours.  ProBNP (last 3 results) No results for input(s): PROBNP in the last 8760 hours.  CBG:  Recent Labs Lab 10/14/15 0941 10/14/15 1040 10/14/15 1231 10/14/15 1634 10/15/15 0747  GLUCAP 76 94 87 184* 162*       Signed:  HERNANDEZ ACOSTA,ESTELA  [facility] Pager: [phone] 10/15/2015, 12:20 PM

## 2015-10-15 NOTE — Care Management Note (Signed)
Case Management Note  Patient Details  Name: [name] [name] MRN: [id number] Date of Birth: 1950/11/02  Expected Discharge Date:  10/10/15               Expected Discharge Plan:  Home/Self Care  In-House Referral:  NA  Discharge planning Services  CM Consult  Post Acute Care Choice:  Home Health Choice offered to:  NA  DME Arranged:    DME Agency:     HH Arranged:    HH Agency:     Status of Service:  Completed, signed off  Medicare Important Message Given:  Yes Date Medicare IM Given:    Medicare IM give by:    Date Additional Medicare IM Given:    Additional Medicare Important Message give by:     If discussed at Long Length of Stay Meetings, dates discussed:  10/15/2015  Additional Comments: Pt underwent amputation on 10/14/2015. Pt is discharging home with self care today. Next dressing change will be in MD office. No CM needs.  Malcolm Metro, RN 10/15/2015, 11:33 AM

## 2015-10-16 ENCOUNTER — Encounter (HOSPITAL_COMMUNITY): Payer: Self-pay | Admitting: General Surgery

## 2015-10-16 LAB — TYPE AND SCREEN
ABO/RH(D): A NEG
ANTIBODY SCREEN: NEGATIVE
UNIT DIVISION: 0
UNIT DIVISION: 0
Unit division: 0
Unit division: 0

## 2015-10-23 ENCOUNTER — Ambulatory Visit: Payer: Medicare Other | Admitting: Gastroenterology

## 2015-10-24 ENCOUNTER — Other Ambulatory Visit: Payer: Self-pay

## 2015-10-24 ENCOUNTER — Ambulatory Visit (INDEPENDENT_AMBULATORY_CARE_PROVIDER_SITE_OTHER): Payer: Medicare Other | Admitting: Gastroenterology

## 2015-10-24 ENCOUNTER — Encounter: Payer: Self-pay | Admitting: Gastroenterology

## 2015-10-24 ENCOUNTER — Other Ambulatory Visit: Payer: Self-pay | Admitting: Gastroenterology

## 2015-10-24 ENCOUNTER — Other Ambulatory Visit (HOSPITAL_COMMUNITY)
Admission: AD | Admit: 2015-10-24 | Discharge: 2015-10-24 | Disposition: A | Discharge status: Home / Self Care | Payer: Medicare Other | Source: Ambulatory Visit | Attending: Gastroenterology | Admitting: Gastroenterology

## 2015-10-24 ENCOUNTER — Encounter: Payer: Self-pay | Admitting: Internal Medicine

## 2015-10-24 VITALS — BP 161/56 | HR 96 | Temp 97.5°F | Ht 69.0 in | Wt 220.8 lb

## 2015-10-24 DIAGNOSIS — R1013 Epigastric pain: Secondary | ICD-10-CM | POA: Insufficient documentation

## 2015-10-24 DIAGNOSIS — N289 Disorder of kidney and ureter, unspecified: Secondary | ICD-10-CM

## 2015-10-24 DIAGNOSIS — R197 Diarrhea, unspecified: Secondary | ICD-10-CM | POA: Diagnosis not present

## 2015-10-24 DIAGNOSIS — R112 Nausea with vomiting, unspecified: Secondary | ICD-10-CM

## 2015-10-24 DIAGNOSIS — K7460 Unspecified cirrhosis of liver: Secondary | ICD-10-CM

## 2015-10-24 DIAGNOSIS — R188 Other ascites: Principal | ICD-10-CM

## 2015-10-24 DIAGNOSIS — R1110 Vomiting, unspecified: Secondary | ICD-10-CM | POA: Insufficient documentation

## 2015-10-24 LAB — CBC WITH DIFFERENTIAL/PLATELET
BASOS ABS: 0.1 10*3/uL (ref 0.0–0.1)
BASOS PCT: 1 %
EOS PCT: 2 %
Eosinophils Absolute: 0.2 10*3/uL (ref 0.0–0.7)
HCT: 36 % — ABNORMAL LOW (ref 39.0–52.0)
Hemoglobin: 12.2 g/dL — ABNORMAL LOW (ref 13.0–17.0)
Lymphocytes Relative: 14 %
Lymphs Abs: 1.5 10*3/uL (ref 0.7–4.0)
MCH: 31.8 pg (ref 26.0–34.0)
MCHC: 33.9 g/dL (ref 30.0–36.0)
MCV: 93.8 fL (ref 78.0–100.0)
Monocytes Absolute: 1.1 10*3/uL — ABNORMAL HIGH (ref 0.1–1.0)
Monocytes Relative: 10 %
Neutro Abs: 8.1 10*3/uL — ABNORMAL HIGH (ref 1.7–7.7)
Neutrophils Relative %: 73 %
PLATELETS: 129 10*3/uL — AB (ref 150–400)
RBC: 3.84 MIL/uL — ABNORMAL LOW (ref 4.22–5.81)
RDW: 14.7 % (ref 11.5–15.5)
WBC: 10.9 10*3/uL — ABNORMAL HIGH (ref 4.0–10.5)

## 2015-10-24 LAB — LIPASE, BLOOD: Lipase: 39 U/L (ref 11–51)

## 2015-10-24 LAB — COMPREHENSIVE METABOLIC PANEL
ALBUMIN: 3.3 g/dL — AB (ref 3.5–5.0)
ALT: 24 U/L (ref 17–63)
ANION GAP: 12 (ref 5–15)
AST: 56 U/L — ABNORMAL HIGH (ref 15–41)
Alkaline Phosphatase: 128 U/L — ABNORMAL HIGH (ref 38–126)
BUN: 65 mg/dL — ABNORMAL HIGH (ref 6–20)
CO2: 26 mmol/L (ref 22–32)
Calcium: 9.7 mg/dL (ref 8.9–10.3)
Chloride: 103 mmol/L (ref 101–111)
Creatinine, Ser: 4.11 mg/dL — ABNORMAL HIGH (ref 0.61–1.24)
GFR calc Af Amer: 16 mL/min — ABNORMAL LOW (ref >60)
GFR calc non Af Amer: 14 mL/min — ABNORMAL LOW (ref >60)
GLUCOSE: 159 mg/dL — AB (ref 65–99)
POTASSIUM: 5.1 mmol/L (ref 3.5–5.1)
SODIUM: 141 mmol/L (ref 135–145)
TOTAL PROTEIN: 7.7 g/dL (ref 6.5–8.1)
Total Bilirubin: 2 mg/dL — ABNORMAL HIGH (ref 0.3–1.2)

## 2015-10-24 LAB — AMMONIA: Ammonia: 51 umol/L — ABNORMAL HIGH (ref 9–35)

## 2015-10-24 MED ORDER — ONDANSETRON HCL 4 MG PO TABS: 4.0000 mg | ORAL_TABLET | Freq: Three times a day (TID) | ORAL | Status: DC | PRN

## 2015-10-24 MED ORDER — LACTULOSE 10 GM/15ML PO SOLN: 10.0000 g | Freq: Two times a day (BID) | ORAL | Status: DC

## 2015-10-24 NOTE — Progress Notes (Signed)
Quick Note:  Ammonia slightly elevated but stable. Electrolytes normal. Renal function worsening likely in the setting of dehydration. LFTs are stable.  Please let patient's granddaughter Candelaria Celeste) know results and recommendations. I discussed everything with Dr. Gala Romney who is in agreement.   Recommend adding lactulose however with acute diarrhea we need to wait until this resolves. Goal of 2-3 soft stools daily. Please let patient know prescription was sent in but he needs to wait until he improves from vomiting and diarrhea. Needs to hold his torsemide for 2-3 days. Gently hydrate, drink to keep urine light in color but don't over do it.  Need to repeat met-7 on Monday morning and follow up with Dr. Hinda Lenis as planned for next week. Send copy of today's labs to Dr. Hinda Lenis as well. ______

## 2015-10-24 NOTE — Patient Instructions (Addendum)
1. Try to increase your fluid intake today and add soft foods.  2. Hold Torsemide tomorrow due to dehydration. Consider calling the kidney doctor, Dr. Fausto Skillern at [phone], regarding your concern for tremors ?related to torsemide. 3. Please have you labs done as soon as you leave our office. We will call [facility] with results per your request.  4. Call me when you feel like you need more fluid drawn of your abdomen and we will schedule a tap for you. 5. If your symptoms worse, you have persistent vomiting, diarrhea or are not able to eat, go to the ER. 6. Return to the office in one month. At that time we will try to add back nadolol at a lower dose to see if you can tolerate it. 7. RX for zofran for nausea sent to your pharmacy.
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
## 2015-10-24 NOTE — Progress Notes (Signed)
cc'ed to pcp °

## 2015-10-28 ENCOUNTER — Other Ambulatory Visit (HOSPITAL_COMMUNITY)
Admission: RE | Admit: 2015-10-28 | Discharge: 2015-10-28 | Disposition: A | Discharge status: Home / Self Care | Payer: Medicare Other | Source: Ambulatory Visit | Attending: Gastroenterology | Admitting: Gastroenterology

## 2015-10-28 DIAGNOSIS — N289 Disorder of kidney and ureter, unspecified: Secondary | ICD-10-CM | POA: Insufficient documentation

## 2015-10-28 LAB — BASIC METABOLIC PANEL
Anion gap: 12 (ref 5–15)
BUN: 57 mg/dL — AB (ref 6–20)
CHLORIDE: 105 mmol/L (ref 101–111)
CO2: 23 mmol/L (ref 22–32)
CREATININE: 3.56 mg/dL — AB (ref 0.61–1.24)
Calcium: 9.4 mg/dL (ref 8.9–10.3)
GFR calc Af Amer: 19 mL/min — ABNORMAL LOW (ref >60)
GFR calc non Af Amer: 16 mL/min — ABNORMAL LOW (ref >60)
GLUCOSE: 58 mg/dL — AB (ref 65–99)
POTASSIUM: 4.2 mmol/L (ref 3.5–5.1)
Sodium: 140 mmol/L (ref 135–145)

## 2015-10-29 NOTE — Progress Notes (Signed)
Quick Note:  Send these labs to Dr. Fausto Skillern as well. When does patient see Dr. Fausto Skillern this week? If he is not having significant swelling in his legs or abdomen, I would advise he continue to hold torsemide until seen by Dr. Fausto Skillern. How is his n/v, diarrhea? ______

## 2015-11-14 ENCOUNTER — Telehealth: Payer: Self-pay

## 2015-11-14 NOTE — Telephone Encounter (Signed)
Grand-daughter called because they received the letter that we have been trying to call them. He is going to see  Dr. Fausto Skillern on 11/20/15 @ 2:00. He has been taking the torsemide because he had some swelling in his abd. In the last two day he has had some N/V.

## 2015-11-15 NOTE — Telephone Encounter (Signed)
Tried to call pts home number- still says you are calling [employer]. Tried to call granddaughter, NA- LMOM

## 2015-11-15 NOTE — Telephone Encounter (Signed)
[name] [name], patient called back per your letter but does not look like he was given instructions per result note 10/28/15. I'm worried that he may have worsening renal failure if he is having vomiting and is still taking his torsemide.  I recommend he hold the torsemide, if symptoms worsen, go to the ER (vomiting, decreased urine output, lightheaded/dizzy, cramps, etc).   Keep appt with Dr. Fausto Skillern.

## 2015-11-20 ENCOUNTER — Other Ambulatory Visit (HOSPITAL_COMMUNITY)
Admission: AD | Admit: 2015-11-20 | Discharge: 2015-11-20 | Disposition: A | Discharge status: Home / Self Care | Payer: Medicare Other | Source: Ambulatory Visit | Attending: Nephrology | Admitting: Nephrology

## 2015-11-20 DIAGNOSIS — K922 Gastrointestinal hemorrhage, unspecified: Secondary | ICD-10-CM | POA: Diagnosis present

## 2015-11-20 DIAGNOSIS — Z79899 Other long term (current) drug therapy: Secondary | ICD-10-CM | POA: Diagnosis present

## 2015-11-20 DIAGNOSIS — N179 Acute kidney failure, unspecified: Secondary | ICD-10-CM | POA: Diagnosis present

## 2015-11-20 DIAGNOSIS — D649 Anemia, unspecified: Secondary | ICD-10-CM | POA: Diagnosis present

## 2015-11-20 LAB — RENAL FUNCTION PANEL
ALBUMIN: 3.2 g/dL — AB (ref 3.5–5.0)
ANION GAP: 7 (ref 5–15)
BUN: 45 mg/dL — ABNORMAL HIGH (ref 6–20)
CALCIUM: 9 mg/dL (ref 8.9–10.3)
CO2: 23 mmol/L (ref 22–32)
Chloride: 109 mmol/L (ref 101–111)
Creatinine, Ser: 2.8 mg/dL — ABNORMAL HIGH (ref 0.61–1.24)
GFR, EST AFRICAN AMERICAN: 26 mL/min — AB (ref >60)
GFR, EST NON AFRICAN AMERICAN: 22 mL/min — AB (ref >60)
Glucose, Bld: 117 mg/dL — ABNORMAL HIGH (ref 65–99)
PHOSPHORUS: 2.6 mg/dL (ref 2.5–4.6)
POTASSIUM: 4.8 mmol/L (ref 3.5–5.1)
Sodium: 139 mmol/L (ref 135–145)

## 2015-11-20 LAB — HEMOGLOBIN AND HEMATOCRIT, BLOOD
HCT: 31.8 % — ABNORMAL LOW (ref 39.0–52.0)
HEMOGLOBIN: 10.8 g/dL — AB (ref 13.0–17.0)

## 2015-11-20 NOTE — Telephone Encounter (Signed)
Tried to call granddaughter- NA-LM

## 2015-11-21 NOTE — Telephone Encounter (Signed)
Tried to call- NA-LM.  

## 2015-11-22 NOTE — Telephone Encounter (Signed)
Tried to call- NA-LMOM 

## 2015-11-25 ENCOUNTER — Encounter: Payer: Self-pay | Admitting: Gastroenterology

## 2015-11-25 ENCOUNTER — Ambulatory Visit (INDEPENDENT_AMBULATORY_CARE_PROVIDER_SITE_OTHER): Payer: Medicare Other | Admitting: Gastroenterology

## 2015-11-25 ENCOUNTER — Other Ambulatory Visit: Payer: Self-pay

## 2015-11-25 VITALS — BP 139/81 | HR 79 | Temp 96.9°F | Ht 69.0 in | Wt 219.6 lb

## 2015-11-25 DIAGNOSIS — K7460 Unspecified cirrhosis of liver: Secondary | ICD-10-CM

## 2015-11-25 DIAGNOSIS — R188 Other ascites: Secondary | ICD-10-CM
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
## 2015-11-25 NOTE — Patient Instructions (Signed)
1. Ultrasound with removal of abdominal fluid if present. 2. We will update medication list with your granddaughter.  3. We will get records from your kidney doctor for review. 4. Return to the office in 8 weeks or sooner if needed.

## 2015-11-25 NOTE — Telephone Encounter (Signed)
Pt came in for o/v today. 

## 2015-11-27 ENCOUNTER — Ambulatory Visit (HOSPITAL_COMMUNITY): Admission: RE | Admit: 2015-11-27 | Payer: Medicare Other | Source: Ambulatory Visit

## 2015-11-27 ENCOUNTER — Telehealth: Payer: Self-pay

## 2015-11-27 NOTE — Assessment & Plan Note (Addendum)
Creatinine improved, continue to follow with nephrology. We need to determine what medications he is currently on, we will call the granddaughter who is managing his medications. Clinically he has been stable except for some recurrent ascites. Send for limited ultrasound with paracentesis if appropriate. If 4 L removed we will give him IV albumin 25 g.  Plan on labs (including immunoglobulins and repeat smooth muscle ab) and abdominal ultrasound for hepatoma screening and may, follow-up office visit in April with Dr. Luvenia Starch plan.

## 2015-11-27 NOTE — Telephone Encounter (Signed)
Pt's wife called and said pt told her that someone had called a couple of times from out office today. I told her that I had called only once and that I need to speak to Baxter Hire to review his medication list, She said [name] was in the bathroom and would call me in about 20 min.

## 2015-11-27 NOTE — Progress Notes (Signed)
Requested records.

## 2015-11-27 NOTE — Progress Notes (Signed)
CC'ED TO PCP 

## 2015-11-27 NOTE — Telephone Encounter (Signed)
I called pt and asked to speak to his granddaughter, [name] [name] , to review his medication list. He said she is in bed and will not get up until noon. He said he will have her call me with his med list.

## 2015-11-27 NOTE — Progress Notes (Addendum)
1. Please plan on the following labs in 12/2015:LFTs, PT/INR, Met-7, CBC, IgA/IgG/IgM, smooth muscle antibody, mitochrondrial antibody 2. Abd u/s in 12/2015 for hepatoma screening.  3. ALSO NEED LAST OV NOTE FROM DR. BEFAKADU. 4. Please call granddaughter and find out what his medications are and update medication list. 5. CAN WE ALSO GET COLONOSCOPY FROM [facility].

## 2015-11-28 NOTE — Progress Notes (Signed)
Medication list is correct per OV note.

## 2015-11-28 NOTE — Telephone Encounter (Signed)
LMOM for the pt that he needs to call Radiology to reschedule his [place] and para.  Call if he has questions.

## 2015-11-28 NOTE — Telephone Encounter (Signed)
May have been radiology department calling. Patient no showed for his u/s and paracentesis yesterday.

## 2015-11-29 ENCOUNTER — Other Ambulatory Visit: Payer: Self-pay

## 2015-11-29 DIAGNOSIS — R188 Other ascites: Secondary | ICD-10-CM

## 2015-12-03 ENCOUNTER — Ambulatory Visit (HOSPITAL_COMMUNITY)
Admission: RE | Admit: 2015-12-03 | Discharge: 2015-12-03 | Disposition: A | Discharge status: Home / Self Care | Payer: Medicare Other | Source: Ambulatory Visit | Attending: Gastroenterology | Admitting: Gastroenterology

## 2015-12-03 VITALS — BP 137/78 | HR 72 | Temp 97.8°F | Resp 16

## 2015-12-03 DIAGNOSIS — R188 Other ascites: Secondary | ICD-10-CM | POA: Diagnosis not present

## 2015-12-03 DIAGNOSIS — K7460 Unspecified cirrhosis of liver: Secondary | ICD-10-CM | POA: Diagnosis not present

## 2015-12-03 DIAGNOSIS — R932 Abnormal findings on diagnostic imaging of liver and biliary tract: Secondary | ICD-10-CM | POA: Insufficient documentation

## 2015-12-03 LAB — GRAM STAIN

## 2015-12-03 NOTE — Procedures (Signed)
PreOperative Dx: Cirrhosis, ascites Postoperative Dx: Cirrhosis, ascites Procedure:   US guided paracentesis Radiologist:  Tyron RussellBoles Anesthesia:  10 ml of 1% lidocaine Specimen:  5000 ml of yellow ascitic fluid EBL:   < 1 ml Complications: None

## 2015-12-03 NOTE — Progress Notes (Signed)
Paracentesis complete no signs of distress. 5000 ml yellow colored ascites removed.  

## 2015-12-04 ENCOUNTER — Telehealth: Payer: Self-pay | Admitting: Internal Medicine

## 2015-12-04 NOTE — Telephone Encounter (Signed)
Pt calling for his results from US done yesterday. I told his wife that it could take up to 10 business days and once doctor signs off on results the nurse would contact the patient.

## 2015-12-04 NOTE — Progress Notes (Signed)
Quick Note:  For some reason the cell count with diff was not done. Fungus culture done instead, which I never order on ascitic fluid. ______

## 2015-12-04 NOTE — Progress Notes (Signed)
Quick Note:  Stable findings. Possible focus of sludge in gallbladder. No liver masses.  Fluid cultures negative so far. Next ruq u/s in 6 months. ______

## 2015-12-05 NOTE — Telephone Encounter (Signed)
Tried to call pt- NA-LMOM with results.  

## 2015-12-06 ENCOUNTER — Other Ambulatory Visit: Payer: Self-pay

## 2015-12-06 DIAGNOSIS — K7460 Unspecified cirrhosis of liver: Secondary | ICD-10-CM

## 2015-12-06 NOTE — Progress Notes (Signed)
Pt needs U/S in 12/2015. Routing to clinical pool

## 2015-12-06 NOTE — Progress Notes (Signed)
Lab orders on file. 

## 2015-12-08 LAB — CULTURE, BODY FLUID W GRAM STAIN -BOTTLE: Culture: NO GROWTH

## 2015-12-08 LAB — CULTURE, BODY FLUID-BOTTLE

## 2015-12-08 NOTE — Progress Notes (Signed)
Quick Note:  I knew we ordered it correctly, but somehow mixed up by radiology or lab. Looks [facility]. Please let pt know his fluid culture was negative. ______

## 2015-12-09 NOTE — Progress Notes (Signed)
Pt on recall list 

## 2015-12-10 LAB — FUNGUS CULTURE, BLOOD: Culture: NO GROWTH

## 2015-12-11 ENCOUNTER — Other Ambulatory Visit (HOSPITAL_COMMUNITY)
Admission: RE | Admit: 2015-12-11 | Discharge: 2015-12-11 | Disposition: A | Discharge status: Home / Self Care | Payer: Medicare Other | Source: Ambulatory Visit | Attending: Nephrology | Admitting: Nephrology

## 2015-12-11 DIAGNOSIS — N183 Chronic kidney disease, stage 3 (moderate): Secondary | ICD-10-CM | POA: Insufficient documentation

## 2015-12-11 DIAGNOSIS — D509 Iron deficiency anemia, unspecified: Secondary | ICD-10-CM | POA: Diagnosis present

## 2015-12-11 DIAGNOSIS — R809 Proteinuria, unspecified: Secondary | ICD-10-CM | POA: Insufficient documentation

## 2015-12-11 DIAGNOSIS — E559 Vitamin D deficiency, unspecified: Secondary | ICD-10-CM | POA: Insufficient documentation

## 2015-12-11 DIAGNOSIS — Z79899 Other long term (current) drug therapy: Secondary | ICD-10-CM | POA: Diagnosis present

## 2015-12-11 DIAGNOSIS — I10 Essential (primary) hypertension: Secondary | ICD-10-CM | POA: Diagnosis present

## 2015-12-11 LAB — RENAL FUNCTION PANEL
ANION GAP: 6 (ref 5–15)
Albumin: 2.9 g/dL — ABNORMAL LOW (ref 3.5–5.0)
BUN: 35 mg/dL — AB (ref 6–20)
CALCIUM: 8.2 mg/dL — AB (ref 8.9–10.3)
CHLORIDE: 106 mmol/L (ref 101–111)
CO2: 23 mmol/L (ref 22–32)
CREATININE: 3.1 mg/dL — AB (ref 0.61–1.24)
GFR calc non Af Amer: 19 mL/min — ABNORMAL LOW (ref >60)
GFR, EST AFRICAN AMERICAN: 23 mL/min — AB (ref >60)
Glucose, Bld: 128 mg/dL — ABNORMAL HIGH (ref 65–99)
Phosphorus: 3.5 mg/dL (ref 2.5–4.6)
Potassium: 3.4 mmol/L — ABNORMAL LOW (ref 3.5–5.1)
Sodium: 135 mmol/L (ref 135–145)

## 2015-12-11 LAB — PROTEIN / CREATININE RATIO, URINE: CREATININE, URINE: 29.24 mg/dL

## 2015-12-11 LAB — FERRITIN: FERRITIN: 253 ng/mL (ref 24–336)

## 2015-12-11 LAB — IRON AND TIBC
IRON: 92 ug/dL (ref 45–182)
Saturation Ratios: 41 % — ABNORMAL HIGH (ref 17.9–39.5)
TIBC: 225 ug/dL — ABNORMAL LOW (ref 250–450)
UIBC: 133 ug/dL

## 2015-12-11 LAB — HEMOGLOBIN AND HEMATOCRIT, BLOOD
HEMATOCRIT: 28.9 % — AB (ref 39.0–52.0)
HEMOGLOBIN: 10.1 g/dL — AB (ref 13.0–17.0)

## 2015-12-12 LAB — PTH, INTACT AND CALCIUM
Calcium, Total (PTH): 8.6 mg/dL (ref 8.6–10.2)
PTH: 64 pg/mL (ref 15–65)

## 2015-12-12 LAB — VITAMIN D 25 HYDROXY (VIT D DEFICIENCY, FRACTURES): Vit D, 25-Hydroxy: 19.5 ng/mL — ABNORMAL LOW (ref 30.0–100.0)

## 2015-12-18 ENCOUNTER — Encounter: Payer: Self-pay | Admitting: Gastroenterology

## 2015-12-18 ENCOUNTER — Encounter: Payer: Self-pay | Admitting: Internal Medicine

## 2015-12-23 ENCOUNTER — Telehealth: Payer: Self-pay | Admitting: Internal Medicine

## 2015-12-23 ENCOUNTER — Ambulatory Visit (HOSPITAL_COMMUNITY): Admission: RE | Admit: 2015-12-23 | Payer: Medicare Other | Source: Ambulatory Visit

## 2015-12-23 ENCOUNTER — Other Ambulatory Visit (HOSPITAL_COMMUNITY): Payer: Medicare Other

## 2015-12-23 NOTE — Telephone Encounter (Signed)
OK to schedule LVAP. Hold torsemide the day of the LVAP. If 4Liters of fluid removed, given IV albumin 25 grams.  If symptoms worsen go to the ER.

## 2015-12-23 NOTE — Telephone Encounter (Signed)
Pt is set up PARA in the morning at 11:00. Granddaughter is aware

## 2015-12-23 NOTE — Telephone Encounter (Signed)
Routing to Ginger to schedule  

## 2015-12-23 NOTE — Telephone Encounter (Signed)
Pt's wife called to say that patient has been sick all weekend and needed to see RMR. I told her that patient is already scheduled for 4/18 to see RMR and I could make OV with extenders if they have something sooner. Then she said that she thought he needed fluid drawn. I told her that I would have the nurse call them back and see if we could schedule a para. Please advise. [phone]

## 2015-12-23 NOTE — Telephone Encounter (Signed)
Spoke with the pts granddaughter- she said the pt was having a lot of abd swelling, nausea, difficulty breathing. They would like a para done, she said he can go anytime but would prefer tomorrow rather than today. Verlon AuLeslie, Is it ok that we schedule this?

## 2015-12-24 ENCOUNTER — Other Ambulatory Visit (HOSPITAL_COMMUNITY): Payer: Medicare Other

## 2015-12-24 ENCOUNTER — Ambulatory Visit (HOSPITAL_COMMUNITY)
Admission: RE | Admit: 2015-12-24 | Discharge: 2015-12-24 | Disposition: A | Discharge status: Home / Self Care | Payer: Medicare Other | Source: Ambulatory Visit | Attending: Gastroenterology | Admitting: Gastroenterology

## 2015-12-24 DIAGNOSIS — R188 Other ascites: Secondary | ICD-10-CM | POA: Diagnosis not present

## 2015-12-24 DIAGNOSIS — K7460 Unspecified cirrhosis of liver: Secondary | ICD-10-CM | POA: Insufficient documentation

## 2015-12-24 LAB — BODY FLUID CELL COUNT WITH DIFFERENTIAL
EOS FL: 0 %
Lymphs, Fluid: 37 %
MONOCYTE-MACROPHAGE-SEROUS FLUID: 59 % (ref 50–90)
NEUTROPHIL FLUID: 4 % (ref 0–25)
WBC FLUID: 116 uL (ref 0–1000)

## 2015-12-24 LAB — GRAM STAIN

## 2015-12-24 MED ORDER — ALBUMIN HUMAN 25 % IV SOLN
INTRAVENOUS | Status: AC
  Filled 2015-12-24: qty 100

## 2015-12-24 MED ORDER — ALBUMIN HUMAN 25 % IV SOLN
25.0000 g | Freq: Once | INTRAVENOUS | Status: AC
  Administered 2015-12-24: 25 g via INTRAVENOUS

## 2015-12-24 NOTE — Progress Notes (Signed)
Paracentesis complete no signs of distress 8500 ml yellow colored ascites removed.  

## 2015-12-24 NOTE — Procedures (Signed)
PreOperative Dx: Cirrhosis, ascites Postoperative Dx: Cirrhosis, ascites Procedure:   US guided paracentesis Radiologist:  Tyron RussellBoles Anesthesia:  10 ml of1% lidocaine Specimen:  8500 ml of yellow ascitic fluid EBL:   < 1 ml Complications: None

## 2015-12-25 LAB — PATHOLOGIST SMEAR REVIEW

## 2015-12-27 ENCOUNTER — Other Ambulatory Visit: Payer: Self-pay

## 2015-12-27 DIAGNOSIS — K7460 Unspecified cirrhosis of liver: Secondary | ICD-10-CM

## 2015-12-29 LAB — CULTURE, BODY FLUID W GRAM STAIN -BOTTLE: Culture: NO GROWTH

## 2016-01-06 ENCOUNTER — Telehealth: Payer: Self-pay | Admitting: Internal Medicine

## 2016-01-06 DIAGNOSIS — R197 Diarrhea, unspecified: Secondary | ICD-10-CM

## 2016-01-06 NOTE — Telephone Encounter (Signed)
Routing to EG in LSL absence

## 2016-01-06 NOTE — Telephone Encounter (Signed)
Tried to call pt- NA- LMOM 

## 2016-01-06 NOTE — Telephone Encounter (Signed)
pts granddaughter- Baxter HireKristen returned call, pt has been having around 8 bm's daily, it started about 1 week ago. He has not seen any blood in his stool until today, he went to mow a yard and when he got home he had blood in his stool. No reported fever, he has stopped taking lactulose. No recent abx.   Kristens new phone number- [phone]

## 2016-01-06 NOTE — Telephone Encounter (Signed)
[phone]  PATIENT WANTS SOMETHING CALLED IN FOR DIARRHEA, HE CAN NOT CONTROL HIS BOWELS

## 2016-01-06 NOTE — Telephone Encounter (Signed)
Ask how many stools a day, any blood? When did this start? May need stool studies depending on additional info.  In the meantime, if he is still on lactulose, he can cut back on the amount of lactulose to see if that helps. Call back with the results (may take a couple days to notice effect of reduced dose).

## 2016-01-07 ENCOUNTER — Emergency Department (HOSPITAL_COMMUNITY): Payer: Medicare Other

## 2016-01-07 ENCOUNTER — Observation Stay (HOSPITAL_COMMUNITY)
Admission: EM | Admit: 2016-01-07 | Discharge: 2016-01-11 | Disposition: A | Discharge status: Home / Self Care | Payer: Medicare Other | Attending: Family Medicine | Admitting: Family Medicine

## 2016-01-07 ENCOUNTER — Encounter (HOSPITAL_COMMUNITY): Payer: Self-pay | Admitting: Emergency Medicine

## 2016-01-07 DIAGNOSIS — N185 Chronic kidney disease, stage 5: Secondary | ICD-10-CM | POA: Diagnosis present

## 2016-01-07 DIAGNOSIS — Z87891 Personal history of nicotine dependence: Secondary | ICD-10-CM | POA: Insufficient documentation

## 2016-01-07 DIAGNOSIS — E039 Hypothyroidism, unspecified: Secondary | ICD-10-CM | POA: Diagnosis not present

## 2016-01-07 DIAGNOSIS — R188 Other ascites: Secondary | ICD-10-CM | POA: Diagnosis not present

## 2016-01-07 DIAGNOSIS — E1101 Type 2 diabetes mellitus with hyperosmolarity with coma: Secondary | ICD-10-CM

## 2016-01-07 DIAGNOSIS — Z6835 Body mass index (BMI) 35.0-35.9, adult: Secondary | ICD-10-CM | POA: Insufficient documentation

## 2016-01-07 DIAGNOSIS — IMO0002 Reserved for concepts with insufficient information to code with codable children: Secondary | ICD-10-CM | POA: Diagnosis present

## 2016-01-07 DIAGNOSIS — A047 Enterocolitis due to Clostridium difficile: Secondary | ICD-10-CM | POA: Diagnosis not present

## 2016-01-07 DIAGNOSIS — D638 Anemia in other chronic diseases classified elsewhere: Secondary | ICD-10-CM | POA: Diagnosis not present

## 2016-01-07 DIAGNOSIS — N184 Chronic kidney disease, stage 4 (severe): Secondary | ICD-10-CM | POA: Insufficient documentation

## 2016-01-07 DIAGNOSIS — K7460 Unspecified cirrhosis of liver: Secondary | ICD-10-CM | POA: Insufficient documentation

## 2016-01-07 DIAGNOSIS — D61818 Other pancytopenia: Secondary | ICD-10-CM | POA: Insufficient documentation

## 2016-01-07 DIAGNOSIS — K922 Gastrointestinal hemorrhage, unspecified: Secondary | ICD-10-CM | POA: Diagnosis not present

## 2016-01-07 DIAGNOSIS — Z89421 Acquired absence of other right toe(s): Secondary | ICD-10-CM | POA: Insufficient documentation

## 2016-01-07 DIAGNOSIS — D696 Thrombocytopenia, unspecified: Secondary | ICD-10-CM | POA: Diagnosis not present

## 2016-01-07 DIAGNOSIS — N179 Acute kidney failure, unspecified: Secondary | ICD-10-CM | POA: Diagnosis not present

## 2016-01-07 DIAGNOSIS — R161 Splenomegaly, not elsewhere classified: Secondary | ICD-10-CM | POA: Diagnosis not present

## 2016-01-07 DIAGNOSIS — K3189 Other diseases of stomach and duodenum: Secondary | ICD-10-CM | POA: Insufficient documentation

## 2016-01-07 DIAGNOSIS — E1122 Type 2 diabetes mellitus with diabetic chronic kidney disease: Secondary | ICD-10-CM | POA: Diagnosis not present

## 2016-01-07 DIAGNOSIS — K625 Hemorrhage of anus and rectum: Secondary | ICD-10-CM | POA: Insufficient documentation

## 2016-01-07 DIAGNOSIS — E1165 Type 2 diabetes mellitus with hyperglycemia: Secondary | ICD-10-CM | POA: Diagnosis not present

## 2016-01-07 DIAGNOSIS — I509 Heart failure, unspecified: Secondary | ICD-10-CM | POA: Insufficient documentation

## 2016-01-07 DIAGNOSIS — Z794 Long term (current) use of insulin: Secondary | ICD-10-CM | POA: Insufficient documentation

## 2016-01-07 DIAGNOSIS — E7800 Pure hypercholesterolemia, unspecified: Secondary | ICD-10-CM | POA: Insufficient documentation

## 2016-01-07 DIAGNOSIS — I130 Hypertensive heart and chronic kidney disease with heart failure and stage 1 through stage 4 chronic kidney disease, or unspecified chronic kidney disease: Secondary | ICD-10-CM | POA: Insufficient documentation

## 2016-01-07 DIAGNOSIS — E876 Hypokalemia: Secondary | ICD-10-CM

## 2016-01-07 DIAGNOSIS — E669 Obesity, unspecified: Secondary | ICD-10-CM | POA: Insufficient documentation

## 2016-01-07 DIAGNOSIS — K766 Portal hypertension: Secondary | ICD-10-CM | POA: Diagnosis not present

## 2016-01-07 DIAGNOSIS — R197 Diarrhea, unspecified: Secondary | ICD-10-CM

## 2016-01-07 DIAGNOSIS — A0472 Enterocolitis due to Clostridium difficile, not specified as recurrent: Secondary | ICD-10-CM

## 2016-01-07 DIAGNOSIS — J449 Chronic obstructive pulmonary disease, unspecified: Secondary | ICD-10-CM | POA: Diagnosis not present

## 2016-01-07 DIAGNOSIS — I8510 Secondary esophageal varices without bleeding: Secondary | ICD-10-CM | POA: Diagnosis not present

## 2016-01-07 LAB — URINALYSIS, ROUTINE W REFLEX MICROSCOPIC
Bilirubin Urine: NEGATIVE
Glucose, UA: NEGATIVE mg/dL
Ketones, ur: NEGATIVE mg/dL
LEUKOCYTES UA: NEGATIVE
NITRITE: NEGATIVE
PROTEIN: NEGATIVE mg/dL
Specific Gravity, Urine: 1.01 (ref 1.005–1.030)
pH: 5 (ref 5.0–8.0)

## 2016-01-07 LAB — HEPATIC FUNCTION PANEL
ALBUMIN: 2.8 g/dL — AB (ref 3.5–5.0)
ALT: 21 U/L (ref 17–63)
AST: 36 U/L (ref 15–41)
Alkaline Phosphatase: 120 U/L (ref 38–126)
BILIRUBIN DIRECT: 0.5 mg/dL (ref 0.1–0.5)
Indirect Bilirubin: 1.5 mg/dL — ABNORMAL HIGH (ref 0.3–0.9)
TOTAL PROTEIN: 6.5 g/dL (ref 6.5–8.1)
Total Bilirubin: 2 mg/dL — ABNORMAL HIGH (ref 0.3–1.2)

## 2016-01-07 LAB — GLUCOSE, CAPILLARY: Glucose-Capillary: 158 mg/dL — ABNORMAL HIGH (ref 65–99)

## 2016-01-07 LAB — CBC WITH DIFFERENTIAL/PLATELET
BASOS ABS: 0 10*3/uL (ref 0.0–0.1)
Basophils Relative: 0 %
EOS ABS: 0.3 10*3/uL (ref 0.0–0.7)
EOS PCT: 4 %
HCT: 29.1 % — ABNORMAL LOW (ref 39.0–52.0)
HEMOGLOBIN: 10.2 g/dL — AB (ref 13.0–17.0)
LYMPHS PCT: 18 %
Lymphs Abs: 1.3 10*3/uL (ref 0.7–4.0)
MCH: 32.9 pg (ref 26.0–34.0)
MCHC: 35.1 g/dL (ref 30.0–36.0)
MCV: 93.9 fL (ref 78.0–100.0)
Monocytes Absolute: 0.8 10*3/uL (ref 0.1–1.0)
Monocytes Relative: 11 %
NEUTROS PCT: 67 %
Neutro Abs: 4.7 10*3/uL (ref 1.7–7.7)
PLATELETS: 104 10*3/uL — AB (ref 150–400)
RBC: 3.1 MIL/uL — AB (ref 4.22–5.81)
RDW: 13.7 % (ref 11.5–15.5)
WBC: 7.1 10*3/uL (ref 4.0–10.5)

## 2016-01-07 LAB — BASIC METABOLIC PANEL
ANION GAP: 9 (ref 5–15)
BUN: 32 mg/dL — ABNORMAL HIGH (ref 6–20)
CHLORIDE: 101 mmol/L (ref 101–111)
CO2: 27 mmol/L (ref 22–32)
Calcium: 8.3 mg/dL — ABNORMAL LOW (ref 8.9–10.3)
Creatinine, Ser: 3.6 mg/dL — ABNORMAL HIGH (ref 0.61–1.24)
GFR calc Af Amer: 19 mL/min — ABNORMAL LOW (ref >60)
GFR, EST NON AFRICAN AMERICAN: 16 mL/min — AB (ref >60)
Glucose, Bld: 179 mg/dL — ABNORMAL HIGH (ref 65–99)
POTASSIUM: 3 mmol/L — AB (ref 3.5–5.1)
SODIUM: 137 mmol/L (ref 135–145)

## 2016-01-07 LAB — TYPE AND SCREEN
ABO/RH(D): A NEG
ANTIBODY SCREEN: NEGATIVE

## 2016-01-07 LAB — LIPASE, BLOOD: LIPASE: 36 U/L (ref 11–51)

## 2016-01-07 LAB — TROPONIN I

## 2016-01-07 LAB — CBG MONITORING, ED: Glucose-Capillary: 113 mg/dL — ABNORMAL HIGH (ref 65–99)

## 2016-01-07 LAB — BRAIN NATRIURETIC PEPTIDE: B NATRIURETIC PEPTIDE 5: 94 pg/mL (ref 0.0–100.0)

## 2016-01-07 LAB — AMMONIA: Ammonia: 32 umol/L (ref 9–35)

## 2016-01-07 LAB — URINE MICROSCOPIC-ADD ON

## 2016-01-07 LAB — HEMOGLOBIN AND HEMATOCRIT, BLOOD
HCT: 28.1 % — ABNORMAL LOW (ref 39.0–52.0)
HEMOGLOBIN: 10 g/dL — AB (ref 13.0–17.0)

## 2016-01-07 MED ORDER — HYDROMORPHONE HCL 1 MG/ML IJ SOLN: 0.5000 mg | INTRAMUSCULAR | Status: DC | PRN

## 2016-01-07 MED ORDER — ATORVASTATIN CALCIUM 40 MG PO TABS
40.0000 mg | ORAL_TABLET | Freq: Every day | ORAL | Status: DC
  Administered 2016-01-07 – 2016-01-10 (×4): 40 mg via ORAL
  Filled 2016-01-07 (×4): qty 1

## 2016-01-07 MED ORDER — ROPINIROLE HCL 1 MG PO TABS
0.5000 mg | ORAL_TABLET | Freq: Every day | ORAL | Status: DC
  Administered 2016-01-07 – 2016-01-10 (×4): 0.5 mg via ORAL
  Filled 2016-01-07 (×4): qty 1

## 2016-01-07 MED ORDER — SODIUM CHLORIDE 0.9 % IV SOLN
INTRAVENOUS | Status: DC
  Administered 2016-01-09: 14:00:00 via INTRAVENOUS

## 2016-01-07 MED ORDER — ALPRAZOLAM 1 MG PO TABS
1.0000 mg | ORAL_TABLET | Freq: Three times a day (TID) | ORAL | Status: DC | PRN
  Administered 2016-01-08: 1 mg via ORAL
  Filled 2016-01-07: qty 1

## 2016-01-07 MED ORDER — PAROXETINE HCL 20 MG PO TABS
10.0000 mg | ORAL_TABLET | Freq: Every day | ORAL | Status: DC
  Administered 2016-01-09 – 2016-01-11 (×3): 10 mg via ORAL
  Filled 2016-01-07 (×4): qty 1

## 2016-01-07 MED ORDER — GABAPENTIN 400 MG PO CAPS
800.0000 mg | ORAL_CAPSULE | Freq: Two times a day (BID) | ORAL | Status: DC
  Administered 2016-01-07 – 2016-01-11 (×7): 800 mg via ORAL
  Filled 2016-01-07 (×2): qty 2
  Filled 2016-01-07: qty 8
  Filled 2016-01-07 (×5): qty 2

## 2016-01-07 MED ORDER — GI COCKTAIL ~~LOC~~
30.0000 mL | Freq: Once | ORAL | Status: AC
  Administered 2016-01-07: 30 mL via ORAL
  Filled 2016-01-07: qty 30

## 2016-01-07 MED ORDER — PANTOPRAZOLE SODIUM 40 MG IV SOLR: 40.0000 mg | Freq: Two times a day (BID) | INTRAVENOUS | Status: DC

## 2016-01-07 MED ORDER — ACETAMINOPHEN 650 MG RE SUPP: 650.0000 mg | Freq: Four times a day (QID) | RECTAL | Status: DC | PRN

## 2016-01-07 MED ORDER — TAMSULOSIN HCL 0.4 MG PO CAPS
0.4000 mg | ORAL_CAPSULE | Freq: Every day | ORAL | Status: DC
  Administered 2016-01-07 – 2016-01-10 (×4): 0.4 mg via ORAL
  Filled 2016-01-07 (×4): qty 1

## 2016-01-07 MED ORDER — INSULIN GLARGINE 100 UNIT/ML ~~LOC~~ SOLN
40.0000 [IU] | Freq: Every day | SUBCUTANEOUS | Status: DC
  Administered 2016-01-07 – 2016-01-08 (×2): 40 [IU] via SUBCUTANEOUS
  Filled 2016-01-07 (×3): qty 0.4

## 2016-01-07 MED ORDER — ONDANSETRON HCL 4 MG/2ML IJ SOLN: 4.0000 mg | Freq: Four times a day (QID) | INTRAMUSCULAR | Status: DC | PRN

## 2016-01-07 MED ORDER — SODIUM CHLORIDE 0.9 % IV SOLN
80.0000 mg | Freq: Once | INTRAVENOUS | Status: AC
  Administered 2016-01-07: 80 mg via INTRAVENOUS
  Filled 2016-01-07: qty 80

## 2016-01-07 MED ORDER — OXYCODONE HCL 5 MG PO TABS
5.0000 mg | ORAL_TABLET | ORAL | Status: DC | PRN
  Administered 2016-01-07 – 2016-01-09 (×3): 5 mg via ORAL
  Filled 2016-01-07 (×3): qty 1

## 2016-01-07 MED ORDER — MAGNESIUM OXIDE 400 (241.3 MG) MG PO TABS
400.0000 mg | ORAL_TABLET | Freq: Every day | ORAL | Status: DC
  Administered 2016-01-09 – 2016-01-10 (×2): 400 mg via ORAL
  Filled 2016-01-07 (×2): qty 1

## 2016-01-07 MED ORDER — PANTOPRAZOLE SODIUM 40 MG IV SOLR
40.0000 mg | Freq: Two times a day (BID) | INTRAVENOUS | Status: DC
  Administered 2016-01-08 – 2016-01-09 (×4): 40 mg via INTRAVENOUS
  Filled 2016-01-07 (×5): qty 40

## 2016-01-07 MED ORDER — ONDANSETRON HCL 4 MG PO TABS: 4.0000 mg | ORAL_TABLET | Freq: Four times a day (QID) | ORAL | Status: DC | PRN

## 2016-01-07 MED ORDER — TIZANIDINE HCL 4 MG PO TABS
4.0000 mg | ORAL_TABLET | Freq: Every day | ORAL | Status: DC
  Administered 2016-01-07 – 2016-01-10 (×4): 4 mg via ORAL
  Filled 2016-01-07 (×4): qty 1

## 2016-01-07 MED ORDER — POTASSIUM CHLORIDE CRYS ER 20 MEQ PO TBCR
40.0000 meq | EXTENDED_RELEASE_TABLET | Freq: Once | ORAL | Status: AC
  Administered 2016-01-07: 40 meq via ORAL
  Filled 2016-01-07: qty 2

## 2016-01-07 MED ORDER — LEVOTHYROXINE SODIUM 75 MCG PO TABS
150.0000 ug | ORAL_TABLET | Freq: Every day | ORAL | Status: DC
  Administered 2016-01-09 – 2016-01-11 (×3): 150 ug via ORAL
  Filled 2016-01-07 (×4): qty 2

## 2016-01-07 MED ORDER — ACETAMINOPHEN 325 MG PO TABS: 650.0000 mg | ORAL_TABLET | Freq: Four times a day (QID) | ORAL | Status: DC | PRN

## 2016-01-07 MED ORDER — INSULIN GLARGINE 100 UNIT/ML ~~LOC~~ SOLN
SUBCUTANEOUS | Status: AC
  Filled 2016-01-07: qty 10

## 2016-01-07 MED ORDER — SPIRONOLACTONE 25 MG PO TABS
25.0000 mg | ORAL_TABLET | Freq: Every day | ORAL | Status: DC
  Filled 2016-01-07: qty 1

## 2016-01-07 NOTE — Addendum Note (Signed)
Addended by: Delane GingerGILL, ERIC A on: 01/07/2016 09:43 AM   Modules accepted: Orders

## 2016-01-07 NOTE — ED Notes (Signed)
Patient states does not have to void. I informed patient we would have to do an in and out. Patient states he will try to void.

## 2016-01-07 NOTE — Telephone Encounter (Signed)
Lets do some labs (CBC, CMP, stool studies). I put them in the system.
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
## 2016-01-07 NOTE — Telephone Encounter (Signed)
pts granddaughter is aware. Lab orders and stool sample containers are at the front desk. She said they would come by today and pick them up.
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
## 2016-01-07 NOTE — ED Notes (Signed)
Having abdominal cramping for  One week, rates pain 5/10.  C/o loose stools, brown pasty.  Had more that 20 loose stools in last 24 hours, stools are not watery.

## 2016-01-08 ENCOUNTER — Encounter (HOSPITAL_COMMUNITY): Payer: Self-pay | Admitting: Gastroenterology

## 2016-01-08 ENCOUNTER — Inpatient Hospital Stay (HOSPITAL_COMMUNITY): Payer: Medicare Other

## 2016-01-08 DIAGNOSIS — R188 Other ascites: Secondary | ICD-10-CM

## 2016-01-08 DIAGNOSIS — R197 Diarrhea, unspecified: Secondary | ICD-10-CM | POA: Diagnosis not present

## 2016-01-08 DIAGNOSIS — K7460 Unspecified cirrhosis of liver: Secondary | ICD-10-CM

## 2016-01-08 DIAGNOSIS — A047 Enterocolitis due to Clostridium difficile: Secondary | ICD-10-CM | POA: Diagnosis not present

## 2016-01-08 DIAGNOSIS — D696 Thrombocytopenia, unspecified: Secondary | ICD-10-CM | POA: Diagnosis not present

## 2016-01-08 DIAGNOSIS — K922 Gastrointestinal hemorrhage, unspecified: Secondary | ICD-10-CM | POA: Diagnosis not present

## 2016-01-08 DIAGNOSIS — N179 Acute kidney failure, unspecified: Secondary | ICD-10-CM | POA: Diagnosis not present

## 2016-01-08 LAB — HEPATIC FUNCTION PANEL
ALT: 18 U/L (ref 17–63)
AST: 29 U/L (ref 15–41)
Albumin: 2.9 g/dL — ABNORMAL LOW (ref 3.5–5.0)
Alkaline Phosphatase: 118 U/L (ref 38–126)
BILIRUBIN DIRECT: 0.5 mg/dL (ref 0.1–0.5)
BILIRUBIN INDIRECT: 0.9 mg/dL (ref 0.3–0.9)
Total Bilirubin: 1.4 mg/dL — ABNORMAL HIGH (ref 0.3–1.2)
Total Protein: 6.2 g/dL — ABNORMAL LOW (ref 6.5–8.1)

## 2016-01-08 LAB — BASIC METABOLIC PANEL
Anion gap: 6 (ref 5–15)
BUN: 32 mg/dL — AB (ref 6–20)
CALCIUM: 8 mg/dL — AB (ref 8.9–10.3)
CHLORIDE: 103 mmol/L (ref 101–111)
CO2: 28 mmol/L (ref 22–32)
CREATININE: 3.76 mg/dL — AB (ref 0.61–1.24)
GFR calc Af Amer: 18 mL/min — ABNORMAL LOW (ref >60)
GFR calc non Af Amer: 15 mL/min — ABNORMAL LOW (ref >60)
GLUCOSE: 136 mg/dL — AB (ref 65–99)
Potassium: 3.2 mmol/L — ABNORMAL LOW (ref 3.5–5.1)
Sodium: 137 mmol/L (ref 135–145)

## 2016-01-08 LAB — CBC
HCT: 24.3 % — ABNORMAL LOW (ref 39.0–52.0)
Hemoglobin: 8.5 g/dL — ABNORMAL LOW (ref 13.0–17.0)
MCH: 32.7 pg (ref 26.0–34.0)
MCHC: 35 g/dL (ref 30.0–36.0)
MCV: 93.5 fL (ref 78.0–100.0)
PLATELETS: 62 10*3/uL — AB (ref 150–400)
RBC: 2.6 MIL/uL — ABNORMAL LOW (ref 4.22–5.81)
RDW: 13.6 % (ref 11.5–15.5)
WBC: 3.7 10*3/uL — ABNORMAL LOW (ref 4.0–10.5)

## 2016-01-08 LAB — GLUCOSE, CAPILLARY
GLUCOSE-CAPILLARY: 62 mg/dL — AB (ref 65–99)
GLUCOSE-CAPILLARY: 87 mg/dL (ref 65–99)
GLUCOSE-CAPILLARY: 95 mg/dL (ref 65–99)
GLUCOSE-CAPILLARY: 97 mg/dL (ref 65–99)
Glucose-Capillary: 111 mg/dL — ABNORMAL HIGH (ref 65–99)

## 2016-01-08 LAB — HEMOGLOBIN AND HEMATOCRIT, BLOOD
HCT: 26.5 % — ABNORMAL LOW (ref 39.0–52.0)
HEMOGLOBIN: 9.3 g/dL — AB (ref 13.0–17.0)

## 2016-01-08 LAB — PROTIME-INR
INR: 1.55 — AB (ref 0.00–1.49)
Prothrombin Time: 18.7 seconds — ABNORMAL HIGH (ref 11.6–15.2)

## 2016-01-08 MED ORDER — HYDROCORTISONE ACETATE 25 MG RE SUPP
25.0000 mg | Freq: Two times a day (BID) | RECTAL | Status: DC
  Administered 2016-01-08 – 2016-01-11 (×6): 25 mg via RECTAL
  Filled 2016-01-08 (×6): qty 1

## 2016-01-08 MED ORDER — DEXTROSE 50 % IV SOLN
INTRAVENOUS | Status: AC
  Administered 2016-01-08: 09:00:00
  Filled 2016-01-08: qty 50

## 2016-01-08 MED ORDER — INSULIN ASPART 100 UNIT/ML ~~LOC~~ SOLN
0.0000 [IU] | Freq: Three times a day (TID) | SUBCUTANEOUS | Status: DC
  Administered 2016-01-09: 3 [IU] via SUBCUTANEOUS
  Administered 2016-01-10: 1 [IU] via SUBCUTANEOUS

## 2016-01-08 MED ORDER — ALBUMIN HUMAN 25 % IV SOLN
25.0000 g | INTRAVENOUS | Status: AC
  Administered 2016-01-08: 25 g via INTRAVENOUS
  Filled 2016-01-08: qty 100

## 2016-01-08 NOTE — Progress Notes (Signed)
MELD score 25.  Nira RetortAnna W. Sams, ANP-BC [facility]
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
## 2016-01-08 NOTE — Care Management Obs Status (Signed)
MEDICARE OBSERVATION STATUS NOTIFICATION   Patient Details  Name: [name] HouseholderRoy L Apachito MRN: [id number] Date of Birth: [birth year]   Medicare Observation Status Notification Given:  Yes    Adonis HugueninBerkhead, Dejai Schubach L, RN 01/08/2016, 3:11 PM

## 2016-01-08 NOTE — Progress Notes (Signed)
Paracentesis complete no signs of distress. 4000 ml yellow colored ascites removed.  

## 2016-01-08 NOTE — Progress Notes (Signed)
Inpatient Diabetes Program Recommendations  AACE/ADA: New Consensus Statement on Inpatient Glycemic Control (2015)  Target Ranges:  Prepandial:   less than 140 mg/dL      Peak postprandial:   less than 180 mg/dL (1-2 hours)      Critically ill patients:  140 - 180 mg/dL   Review of Glycemic Control Results for [name] HouseholderCHURCH, Brok L (MRN [id number]) as of 01/08/2016 12:14  Ref. Range 10/15/2015 11:57 01/07/2016 18:35 01/07/2016 21:37 01/08/2016 07:31 01/08/2016 10:43  Glucose-Capillary Latest Ref Range: 65-99 mg/dL 562163 (H) 130113 (H) 865158 (H) 62 (L) 87   Diabetes history: DM Type 2 Outpatient Diabetes medications: Lantus 40 units daily Current orders for Inpatient glycemic control: Lantus 40 units daily  Inpatient Diabetes Program Recommendations:  Please consider decrease of Lantus insulin to 80% home dose 32 units while in the hospital.  Thank you, [name] FischerJudy E. Charisma Charlot, RN, MSN, CDE Inpatient Glycemic Control Team Team Pager [phone] (8am-5pm) 01/08/2016 12:15 PM

## 2016-01-08 NOTE — Consult Note (Signed)
[name] [name] MRN: [id number] DOB/AGE: 1951-03-01 65 y.o. Primary Care Physician:Jones, Wayland Salinas, PA-C Admit date: 01/07/2016 Chief Complaint:  Chief Complaint  Patient presents with  . Abdominal Cramping   HPI: Pt is 65 year old male with past medical hx of Cirrhosis who came to ER with c/o diarrhea.  HPI dates back to 1 week ago when pt started having diarrhea. Pt was having up to 20 BM per day. Pt also c/o having occasional blood in the stools. Pt came to ER was was found to be FOBT positive. Pt was admitted for further care. Pt offers no c.o chest pain NO c/o fever/chills though c/o occasional cough Pt was recenlty seen in Nephrology office, at that time pt had much edema  and pt torsemide dose was increased and spironolactone was started.    Past Medical History  Diagnosis Date  . Diabetes mellitus   . High cholesterol   . Hypertension   . Cirrhosis of liver (HCC)   . Splenomegaly   . Portal hypertension (HCC)   . Thrombocytopenia (HCC)   . COPD (chronic obstructive pulmonary disease) (HCC)   . Hypothyroidism         Family History  Problem Relation Age of Onset  . Colon cancer Neg Hx   . Liver disease Father     Cirrhosis r/t ETOH  . Stroke Mother   . COPD Sister   . Stroke Brother   . Diabetes Brother     Social History:  reports that he quit smoking about 30 years ago. He has never used smokeless tobacco. He reports that he does not drink alcohol or use illicit drugs.   Allergies: No Known Allergies  Medications Prior to Admission  Medication Sig Dispense Refill  . ALPRAZolam (XANAX) 1 MG tablet Take 1 mg by mouth 3 (three) times daily as needed for anxiety or sleep.     Marland Kitchen gabapentin (NEURONTIN) 800 MG tablet Take 800 mg by mouth 2 (two) times daily.     . insulin glargine (LANTUS) 100 UNIT/ML injection Inject 0.15 mLs (15 Units total) into the skin at bedtime. (Patient taking differently: Inject 50 Units into the skin at bedtime. )    . insulin lispro  (HUMALOG) 100 UNIT/ML injection Inject 10-20 Units into the skin 3 (three) times daily before meals.    Marland Kitchen levothyroxine (SYNTHROID, LEVOTHROID) 150 MCG tablet Take 150 mcg by mouth daily before breakfast.     . Magnesium 400 MG CAPS Take 400 mg by mouth daily.     Marland Kitchen omeprazole (PRILOSEC) 20 MG capsule Take 20 mg by mouth daily.    Marland Kitchen oxyCODONE (OXY IR/ROXICODONE) 5 MG immediate release tablet Take 1 tablet (5 mg total) by mouth every 4 (four) hours as needed for moderate pain. 30 tablet 0  . PARoxetine (PAXIL) 10 MG tablet Take 10 mg by mouth daily. Reported on 11/27/2015    . PROAIR HFA 108 (90 Base) MCG/ACT inhaler Inhale 1-2 puffs into the lungs every 6 (six) hours as needed for wheezing or shortness of breath.     Marland Kitchen rOPINIRole (REQUIP) 0.5 MG tablet Take 0.5 mg by mouth at bedtime.     . simvastatin (ZOCOR) 80 MG tablet Take 80 mg by mouth at bedtime.     Marland Kitchen spironolactone (ALDACTONE) 25 MG tablet Take 25 mg by mouth daily.    . tamsulosin (FLOMAX) 0.4 MG CAPS capsule Take 0.4 mg by mouth at bedtime.     Marland Kitchen tiZANidine (ZANAFLEX) 4 MG  tablet Take 4 mg by mouth at bedtime.     . torsemide (DEMADEX) 20 MG tablet Take 2 tablets (40 mg total) by mouth daily. (Patient taking differently: Take 40 mg by mouth daily. Pt takes one 20 mg tablet in the morning) 60 tablet 2  . insulin aspart (NOVOLOG) 100 UNIT/ML injection Inject 3 Units into the skin 3 (three) times daily with meals. (Patient not taking: Reported on 11/27/2015)    . lactulose (CHRONULAC) 10 GM/15ML solution Take 15 mLs (10 g total) by mouth 2 (two) times daily. To have 2-3 soft stools daily. (Patient not taking: Reported on 11/27/2015) 946 mL 0  . ondansetron (ZOFRAN) 4 MG tablet Take 1 tablet (4 mg total) by mouth every 8 (eight) hours as needed for nausea or vomiting. (Patient not taking: Reported on 11/27/2015) 30 tablet 0       DGL:OVFIE from the symptoms mentioned above,there are no other symptoms referable to all systems reviewed.  Marland Kitchen  albumin human  25 g Intravenous On Call  . atorvastatin  40 mg Oral q1800  . gabapentin  800 mg Oral BID  . insulin aspart  0-9 Units Subcutaneous TID WC  . insulin glargine  40 Units Subcutaneous QHS  . levothyroxine  150 mcg Oral QAC breakfast  . magnesium oxide  400 mg Oral Daily  . pantoprazole (PROTONIX) IV  40 mg Intravenous Q12H  . PARoxetine  10 mg Oral Daily  . rOPINIRole  0.5 mg Oral QHS  . spironolactone  25 mg Oral Daily  . tamsulosin  0.4 mg Oral QHS  . tiZANidine  4 mg Oral QHS         PPI:RJJOA from the symptoms mentioned above,there are no other symptoms referable to all systems reviewed.  Physical Exam: Vital signs in last 24 hours: Temp:  [98 F (36.7 C)-98.4 F (36.9 C)] 98.4 F (36.9 C) (04/11 2139) Pulse Rate:  [88-100] 97 (04/11 2139) Resp:  [15-20] 20 (04/11 2139) BP: (134-182)/(78-91) 134/82 mmHg (04/11 2139) SpO2:  [96 %-100 %] 96 % (04/11 2139) Weight:  [234 lb 2.1 oz (106.2 kg)-240 lb (108.863 kg)] 234 lb 2.1 oz (106.2 kg) (04/11 1902) Weight change:  Last BM Date: 01/08/16  Intake/Output from previous day:       Physical Exam: General- pt is awake,alert, oriented to time place and person Resp- No acute REsp distress, CTA B/L NO Rhonchi CVS- S1S2 regular ij rate and rhythm GIT- BS+, soft, NT, distended, Ascites +  EXT- NO LE Edema( much better), NOCyanosis CNS- CN 2-12 grossly intact. Moving all 4 extremities Psych- normal mood and affect    Lab Results: CBC  Recent Labs  01/07/16 1453 01/07/16 1949 01/08/16 0339  WBC 7.1  --  3.7*  HGB 10.2* 10.0* 8.5*  HCT 29.1* 28.1* 24.3*  PLT 104*  --  62*    BMET  Recent Labs  01/07/16 1453 01/08/16 0339  NA 137 137  K 3.0* 3.2*  CL 101 103  CO2 27 28  GLUCOSE 179* 136*  BUN 32* 32*  CREATININE 3.60* 3.76*  CALCIUM 8.3* 8.0*   Creat trend 2017  3.1=>3.7 2016  2.5--2.8   MICRO No results found for this or any previous visit (from the past 240 hour(s)).    Lab  Results  Component Value Date   PTH 64 12/11/2015   PTH Comment 12/11/2015   CALCIUM 8.0* 01/08/2016   PHOS 3.5 12/11/2015      Impression: 1)Renal  AKI secondary to  Hypovolemia               AKI sec to Prerenal/ATN               AKI on CKD               CKD stage 4.               CKD since 2016               [facility]                Progression of CKD marked with multiple AKI                 2)HTN  Medication- On RAS blockers- Spironolactone   3)Anemia HGb low Admitted with possible GI bleed GI folowing  4)CKD Mineral-Bone Disorder PTH acceptable Secondary Hyperparathyroidismabsent. Phosphorus at goal.   5)Cirrhosis-admitted with hx of Ascites Will need tap GI and PMD following  6)Electrolytes Hypokalemic   On RAAS blockers  NOrmonatremic    7)Acid base Co2 at goal     Plan:  Agree with current iVF Agree with holding diuretics for now Will assess again in am to see if we need to restart Diuretics Pt will need albumin at the time of tap. Will follow BMET   BHUTANI,MANPREET S 01/08/2016, 10:10 AM

## 2016-01-08 NOTE — Progress Notes (Signed)
PROGRESS NOTE  [name] [name] [id number] DOB: Feb 11, 1951 DOA: 01/07/2016 PCP: [name], PA-C  Summary: 25 yom with history of cirrhosis with esophageal varices presented with abdominal pain, distention and diarrhea as well as blood in stool.  Assessment/Plan: 1. Diarrhea, currently resolved at this point. Had a hard stool this afternoon. Etiology unclear. Continue supportive care. 2. Possible GI bleed, possibly related to diarrhea. Hemoglobin stable. Appreciate GI recommendations. 3. Cirrhosis with associated ascites, varices and thrombocytopenia. Status post large volume paracentesis 4/12. Otherwise appears stable. 4. Acute kidney injury superimposed on chronic kidney disease stage IV 5. Diabetes mellitus type 2   Appears clinically stable, successfully completed paracentesis today. His hemoglobin remained stable and he has no evidence of ongoing bleeding. Gastroenterology consultation appreciated. Defer to their management.  Continue IV fluids, follow-up nephrology recommendations.  Check CBC and BMP in the morning  Code Status: full code DVT prophylaxis: SCDs Family Communication: wife at bedside Disposition Plan: home 1-2 days  Brendia Sacks, MD  [facility] Direct contact:  --Via amion app OR  --[email or web address]; password [email or web address] and click  7PM-7AM contact night coverage as above 01/08/2016, 2:10 PM  LOS: 1 day   Consultants:  GI  Procedures:  4/12 Large volume paracentesis 4 L  HPI/Subjective: Still has a cough. Otherwise feels well. No significant pain. No complaints.  Objective: Filed Vitals:   01/07/16 1902 01/07/16 2014 01/07/16 2139 01/08/16 1222  BP: 182/84  134/82 139/86  Pulse: 100  97 90  Temp: 98.2 F (36.8 C)  98.4 F (36.9 C) 98.4 F (36.9 C)  TempSrc: Oral  Oral Oral  Resp: Height:  (1.727 m)     Weight: 106.2 kg (234 lb 2.1 oz)     SpO2: 100% 100% 96% 98%    Intake/Output Summary (Last 24 hours) at 01/08/16  1410 Last data filed at 01/08/16 0900  Gross per 24 hour  Intake      0 ml  Output      0 ml  Net      0 ml     Filed Weights   01/07/16 1433 01/07/16 1902  Weight: 108.863 kg (240 lb) 106.2 kg (234 lb 2.1 oz)    Exam:    General:  Appears calm and comfortable sitting in chair Cardiovascular: RRR, no m/r/g. No LE edema. Telemetry: SR, no arrhythmias  Respiratory: CTA bilaterally, no w/r/r. Normal respiratory effort. Abdomen: Protuberant Psychiatric: grossly normal mood and affect, speech fluent and appropriate  New data reviewed:  Blood sugars stable  Potassium 3.2  BUN 32/creatinine 3.76  LFTs noted  Hemoglobin stable 9.3, platelet count 62  Scheduled Meds: . atorvastatin  40 mg Oral q1800  . gabapentin  800 mg Oral BID  . hydrocortisone  25 mg Rectal BID  . insulin aspart  0-9 Units Subcutaneous TID WC  . insulin glargine  40 Units Subcutaneous QHS  . levothyroxine  150 mcg Oral QAC breakfast  . magnesium oxide  400 mg Oral Daily  . pantoprazole (PROTONIX) IV  40 mg Intravenous Q12H  . PARoxetine  10 mg Oral Daily  . rOPINIRole  0.5 mg Oral QHS  . spironolactone  25 mg Oral Daily  . tamsulosin  0.4 mg Oral QHS  . tiZANidine  4 mg Oral QHS   Continuous Infusions: . sodium chloride      Principal Problem:   Acute diarrhea Active Problems:   Thrombocytopenia (HCC)   Diabetes mellitus type 2, uncontrolled (HCC)  Hypothyroidism   Cirrhosis (HCC)   Ascites   GI bleed   Hypokalemia   Acute on chronic renal failure (HCC)   Bleeding gastrointestinal   Time spent 15 minutes    By signing my name below, I, Zadie CleverlyJessica Augustus attest that this documentation has been prepared under the direction and in the presence of Brendia Sacksaniel Goodrich, MD Electronically signed: Zadie CleverlyJessica Augustus  01/08/2016   I personally performed the services described in this documentation. All medical record entries made by the scribe were at my direction. I have reviewed the chart and  agree that the record reflects my personal performance and is accurate and complete. Brendia Sacksaniel Goodrich, MD

## 2016-01-08 NOTE — Progress Notes (Signed)
Hypoglycemic Event  CBG: 62  Treatment: D50 IV 50 mL  Symptoms: None  Follow-up CBG: Time:1030 CBG Result:87  Possible Reasons for Event: Other: NPO  Comments/MD notified:MD Kirt BoysGoodrich    [name] [name]

## 2016-01-09 DIAGNOSIS — R197 Diarrhea, unspecified: Secondary | ICD-10-CM | POA: Diagnosis not present

## 2016-01-09 DIAGNOSIS — N179 Acute kidney failure, unspecified: Secondary | ICD-10-CM | POA: Diagnosis not present

## 2016-01-09 DIAGNOSIS — K7460 Unspecified cirrhosis of liver: Secondary | ICD-10-CM | POA: Diagnosis not present

## 2016-01-09 DIAGNOSIS — K922 Gastrointestinal hemorrhage, unspecified: Secondary | ICD-10-CM

## 2016-01-09 DIAGNOSIS — D696 Thrombocytopenia, unspecified: Secondary | ICD-10-CM

## 2016-01-09 DIAGNOSIS — R188 Other ascites: Secondary | ICD-10-CM | POA: Diagnosis not present

## 2016-01-09 DIAGNOSIS — N189 Chronic kidney disease, unspecified: Secondary | ICD-10-CM

## 2016-01-09 LAB — BASIC METABOLIC PANEL
ANION GAP: 8 (ref 5–15)
BUN: 31 mg/dL — ABNORMAL HIGH (ref 6–20)
CHLORIDE: 104 mmol/L (ref 101–111)
CO2: 26 mmol/L (ref 22–32)
Calcium: 7.9 mg/dL — ABNORMAL LOW (ref 8.9–10.3)
Creatinine, Ser: 3.49 mg/dL — ABNORMAL HIGH (ref 0.61–1.24)
GFR calc Af Amer: 20 mL/min — ABNORMAL LOW (ref >60)
GFR calc non Af Amer: 17 mL/min — ABNORMAL LOW (ref >60)
GLUCOSE: 54 mg/dL — AB (ref 65–99)
POTASSIUM: 3 mmol/L — AB (ref 3.5–5.1)
Sodium: 138 mmol/L (ref 135–145)

## 2016-01-09 LAB — GLUCOSE, CAPILLARY
GLUCOSE-CAPILLARY: 38 mg/dL — AB (ref 65–99)
GLUCOSE-CAPILLARY: 91 mg/dL (ref 65–99)
GLUCOSE-CAPILLARY: 93 mg/dL (ref 65–99)
Glucose-Capillary: 210 mg/dL — ABNORMAL HIGH (ref 65–99)
Glucose-Capillary: 121 mg/dL — ABNORMAL HIGH (ref 65–99)

## 2016-01-09 LAB — C DIFFICILE QUICK SCREEN W PCR REFLEX
C DIFFICILE (CDIFF) TOXIN: POSITIVE — AB
C Diff antigen: POSITIVE — AB
C Diff interpretation: POSITIVE

## 2016-01-09 LAB — CBC
HEMATOCRIT: 25.3 % — AB (ref 39.0–52.0)
HEMOGLOBIN: 8.7 g/dL — AB (ref 13.0–17.0)
MCH: 32 pg (ref 26.0–34.0)
MCHC: 34.4 g/dL (ref 30.0–36.0)
MCV: 93 fL (ref 78.0–100.0)
Platelets: 63 10*3/uL — ABNORMAL LOW (ref 150–400)
RBC: 2.72 MIL/uL — AB (ref 4.22–5.81)
RDW: 13.4 % (ref 11.5–15.5)
WBC: 3.5 10*3/uL — AB (ref 4.0–10.5)

## 2016-01-09 LAB — OCCULT BLOOD X 1 CARD TO LAB, STOOL: Fecal Occult Bld: NEGATIVE

## 2016-01-09 MED ORDER — SPIRONOLACTONE 25 MG PO TABS
50.0000 mg | ORAL_TABLET | Freq: Every day | ORAL | Status: DC
  Administered 2016-01-09 – 2016-01-10 (×2): 50 mg via ORAL
  Filled 2016-01-09 (×3): qty 2

## 2016-01-09 MED ORDER — POTASSIUM CHLORIDE CRYS ER 20 MEQ PO TBCR
40.0000 meq | EXTENDED_RELEASE_TABLET | ORAL | Status: AC
  Administered 2016-01-09 (×3): 40 meq via ORAL
  Filled 2016-01-09 (×3): qty 2

## 2016-01-09 MED ORDER — GLUCOSE 40 % PO GEL
ORAL | Status: AC
  Administered 2016-01-09: 37.5 g
  Filled 2016-01-09: qty 1

## 2016-01-09 MED ORDER — METRONIDAZOLE 500 MG PO TABS
500.0000 mg | ORAL_TABLET | Freq: Three times a day (TID) | ORAL | Status: DC
  Administered 2016-01-09 – 2016-01-10 (×3): 500 mg via ORAL
  Filled 2016-01-09 (×3): qty 1

## 2016-01-09 MED ORDER — LOPERAMIDE HCL 2 MG PO CAPS
2.0000 mg | ORAL_CAPSULE | Freq: Once | ORAL | Status: AC
  Administered 2016-01-09: 2 mg via ORAL
  Filled 2016-01-09: qty 1

## 2016-01-09 MED ORDER — INSULIN GLARGINE 100 UNIT/ML ~~LOC~~ SOLN
30.0000 [IU] | Freq: Every day | SUBCUTANEOUS | Status: DC
  Administered 2016-01-09: 30 [IU] via SUBCUTANEOUS
  Filled 2016-01-09 (×4): qty 0.3

## 2016-01-09 NOTE — Progress Notes (Signed)
PROGRESS NOTE  [name] HouseholderRoy L [name] [id number] DOB: Jan 21, 1951 DOA: 01/07/2016 PCP: Remus LofflerJones, [name], PA-C  Summary: [age] yom with history of cirrhosis with esophageal varices presented with abdominal pain, distention and diarrhea as well as blood in stool.  Assessment/Plan: 1. Diarrhea, etiology unclear. Soft stools only. No watery diarrhea. Appreciate GI recommendations. 2. Possible GI bleed, hemoglobin remained stable. Outpatient evaluation planned by GI. 3. Pancytopenia, secondary to cirrhosis, stable 4. Cirrhosis with associated ascites, varices and thrombocytopenia. Status post large volume paracentesis 4/12.  5. Acute kidney injury superimposed on chronic kidney disease stage IV. BUN and Cr remain elevated, but improved. 6. Diabetes mellitus type 2. Intermittent hypoglycemia secondary to limited diet.   Appears clinically stable, successfully completed paracentesis 4/12. His hemoglobin remained stable and he has no evidence of ongoing bleeding. Gastroenterology plans outpatient evaluation.  Will decrease Levemir, secondary to hypoglycemia.  Continue management of acute kidney injury per nephrology.  Code Status: full code DVT prophylaxis: SCDs Family Communication: wife at bedside Disposition Plan: home 1-2 days  Brendia Sacksaniel Cayden Granholm, MD  [facility] Direct contact:  --Via amion app OR  --[email or web address]; password [email or web address] and click  7PM-7AM contact night coverage as above 01/09/2016, 7:54 AM  LOS: 2 days   Consultants:  GI  Nephrology  Procedures:  4/12 Large volume paracentesis 4 L  HPI/Subjective: Has had a couple of soft stools, no watery diarrhea. He was able to eat. Denies any nausea, vomiting, or noticeable bleeding. Breathing is okay. Denies any CP.   Objective: Filed Vitals:   01/08/16 1422 01/08/16 1950 01/08/16 2035 01/09/16 0635  BP: 143/84 142/57  100/58  Pulse: 86 96  64  Temp: 97.8 F (36.6 C) 98.9 F (37.2 C)  97.5 F (36.4 C)  TempSrc:  Oral  Oral    Resp: 20 16  18   Height:      Weight:      SpO2: 98% 100% 100% 100%    Intake/Output Summary (Last 24 hours) at 01/09/16 0754 Last data filed at 01/08/16 1924  Gross per 24 hour  Intake    360 ml  Output      0 ml  Net    360 ml     Filed Weights   01/07/16 1433 01/07/16 1902  Weight: 108.863 kg (240 lb) 106.2 kg (234 lb 2.1 oz)    Exam:    General:  Appears calm and comfortable. Sitting up in chair. Oriented to name and place.  Cardiovascular: RRR, no m/r/g. 2+ bilateral LE edema. Telemetry: SR, no arrhythmias  Respiratory: CTA bilaterally, no w/r/r. Normal respiratory effort. Psychiatric: grossly normal mood and affect, speech fluent and appropriate Neurologic: grossly non-focal.    New data reviewed:  Glucose capillary, low, 38  Potassium 3.0  BUN 31  Cr 3.49, slightly improved  WBC 3.5  Hgb 8.7, stable  Htc 25.3  Platelets 63  Scheduled Meds: . atorvastatin  40 mg Oral q1800  . gabapentin  800 mg Oral BID  . hydrocortisone  25 mg Rectal BID  . insulin aspart  0-9 Units Subcutaneous TID WC  . insulin glargine  40 Units Subcutaneous QHS  . levothyroxine  150 mcg Oral QAC breakfast  . magnesium oxide  400 mg Oral Daily  . pantoprazole (PROTONIX) IV  40 mg Intravenous Q12H  . PARoxetine  10 mg Oral Daily  . rOPINIRole  0.5 mg Oral QHS  . spironolactone  25 mg Oral Daily  . tamsulosin  0.4 mg Oral QHS  . tiZANidine  4 mg Oral QHS   Continuous Infusions: . sodium chloride      Principal Problem:   Acute diarrhea Active Problems:   Thrombocytopenia (HCC)   Diabetes mellitus type 2, uncontrolled (HCC)   Hypothyroidism   Cirrhosis (HCC)   Ascites   GI bleed   Hypokalemia   Acute on chronic renal failure (HCC)   Bleeding gastrointestinal   Time spent 20 minutes    By signing my name below, I, [name], attest that this documentation has been prepared under the direction and in the presence of Piper Albro P. Irene Limbo,  MD. Electronically Signed: [name], Scribe.  01/09/2016 11:00am  I personally performed the services described in this documentation. All medical record entries made by the scribe were at my direction. I have reviewed the chart and agree that the record reflects my personal performance and is accurate and complete. Brendia Sacks, MD

## 2016-01-09 NOTE — Progress Notes (Addendum)
Subjective: Feels better today after paracentesis. Oriented x 3. States he had more diarrhea this morning, stool observed in the bedside commode/hat and appears to not be diarrhea but rather soft stools consistent with bristol 6. Having hemorrhoid symptoms including buring with bowel movements. Complains of persistent cough. Denies any blood in stool. No other GI complaints.  Objective: Vital signs in last 24 hours: Temp:  [97.5 F (36.4 C)-98.9 F (37.2 C)] 97.5 F (36.4 C) (04/13 0635) Pulse Rate:  [64-96] 64 (04/13 0635) Resp:  [16-20] 18 (04/13 0635) BP: (100-143)/(57-86) 100/58 mmHg (04/13 0635) SpO2:  [98 %-100 %] 100 % (04/13 0635) Last BM Date: 01/08/16 General:   Alert and oriented, pleasant Head:  Normocephalic and atraumatic. Eyes:  No icterus, sclera clear. Conjuctiva pink.  Heart:  S1, S2 present, no murmurs noted.  Lungs: Clear to auscultation bilaterally, without wheezing, rales, or rhonchi.  Abdomen:  Bowel sounds present, distended and firm but no tense ascites, non-tender. No rebound or guarding. Msk:  Symmetrical without gross deformities. Pulses:  Normal bilateral DP pulses noted. Extremities:  1-2+ edema noted bilaterally. Neurologic:  Alert and  oriented x4;  grossly normal neurologically. Psych:  Alert and cooperative. Normal mood and affect.  Intake/Output from previous day: 04/12 0701 - 04/13 0700 In: 360 [P.O.:360] Out: -  Intake/Output this shift:    Lab Results:  Recent Labs  01/07/16 1453  01/08/16 0339 01/08/16 1118 01/09/16 0614  WBC 7.1  --  3.7*  --  3.5*  HGB 10.2*  < > 8.5* 9.3* 8.7*  HCT 29.1*  < > 24.3* 26.5* 25.3*  PLT 104*  --  62*  --  63*  < > = values in this interval not displayed. BMET  Recent Labs  01/07/16 1453 01/08/16 0339 01/09/16 0614  NA 137 137 138  K 3.0* 3.2* 3.0*  CL 101 103 104  CO2 27 28 26   GLUCOSE 179* 136* 54*  BUN 32* 32* 31*  CREATININE 3.60* 3.76* 3.49*  CALCIUM 8.3* 8.0* 7.9*    LFT  Recent Labs  01/07/16 1453 01/08/16 1407  PROT 6.5 6.2*  ALBUMIN 2.8* 2.9*  AST 36 29  ALT 21 18  ALKPHOS 120 118  BILITOT 2.0* 1.4*  BILIDIR 0.5 0.5  IBILI 1.5* 0.9   PT/INR  Recent Labs  01/08/16 1407  LABPROT 18.7*  INR 1.55*   Hepatitis Panel No results for input(s): HEPBSAG, HCVAB, HEPAIGM, HEPBIGM in the last 72 hours.   Studies/Results: [place] Paracentesis  01/08/2016  INDICATION: Symptomatic ascites EXAM: ULTRASOUND GUIDED  PARACENTESIS MEDICATIONS: None. COMPLICATIONS: None immediate. PROCEDURE: Informed written consent was obtained from the patient after a discussion of the risks, benefits and alternatives to treatment. A timeout was performed prior to the initiation of the procedure. Initial ultrasound scanning demonstrates a large amount of ascites within the left lower abdominal quadrant. The left lower abdomen was prepped and draped in the usual sterile fashion. 1% lidocaine was used for local anesthesia. Following this, a Yueh catheter was introduced. The paracentesis was performed. The catheter was removed and a dressing was applied. The patient tolerated the procedure well without immediate post procedural complication. FINDINGS: A total of approximately 4 L of yellowish fluid was removed. IMPRESSION: Successful ultrasound-guided paracentesis yielding 4 liters of peritoneal fluid. Electronically Signed   By: Marnee SpringJonathon  Watts M.D.   On: 01/08/2016 12:55   Dg Abd Acute W/chest  01/07/2016  CLINICAL DATA:  Cough, abdominal pain, diarrhea for 1 week. EXAM: DG  ABDOMEN ACUTE W/ 1V CHEST COMPARISON:  None. FINDINGS: Nonobstructive bowel gas pattern.  No free air organomegaly. Heart is normal size. No confluent airspace opacities or effusions. No acute bony abnormality. IMPRESSION: No acute cardiopulmonary disease. No evidence of bowel obstruction or free air Electronically Signed   By: Charlett Nose M.D.   On: 01/07/2016 16:24    Assessment: 65 year old male with  decompensated cirrhosis, presenting with acute diarrhea and chronic low volume hematochezia that does not appear to be hemodynamically significant at this time. Although Hgb drifted to the 8 range, it has improved to 9.3 (baseline around 10 range) and likely multifactorial. No evidence of melena, hematemesis. Stool studies ordered but unable to be obtained due to cessation of diarrhea since yesterday. No further hematochezia since yesterday. Tense ascites noted on exam, with paracentesis ordered for later today; he has required multiple since Nov 2016. Worsening renal function noted in setting of chronic renal failure, nephrology on board. No urgent need for colonoscopy evaluation at this time, but it should be pursued if persistent diarrhea or evidence of overt GI Bleeding/acute blood loss anemia. It is unclear if he has had a colonoscopy or not, as his reports are mixed regarding this (Possibly at [facility] per office notes, but patient denies prior colonoscopy). 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 Will need serial EGD for variceal surveillance.   Ascites: Status post paracentesis yesterday with 4L removed, no labs due to recent fluid labs 3/31. Abdomen firm but symptomatically improved.  Rectal bleeding: chronic, follow H/H, provide anusol.   Cirrhosis: patient has been declining since first establishing care with our office late last year. Overall prognosis appears poor. MELD 25, Child Pugh C.   No further bleeding noted with bowel movement.  BMP shows some improvement in Cr to 3.49 from 3.76 (baseline 2.8-3.2), hgb with slight drift downward to 8.7 from 9.3 (baseline 8-10 over the past several months), platelets remain stable and low at 63.   Plan: 1. Continue to monitor H/H 2. Continue Annusol 3. Diuretics per nephrology 4. Continue to monitor for any bleeding or recurrent diarrhea and consider TCS if he has recurrence 5. CBC,  CMP tomorrow    Wynne Dust, AGNP-C Adult & Gerontological Nurse Practitioner [facility]    LOS: 2 days    01/09/2016, 9:12 AM    Attending note: C. difficile toxin assay and antigen have come come back positive. We'll begin metronidazole 500 mg 3 times a day 10 days.

## 2016-01-09 NOTE — Progress Notes (Signed)
Subjective: Interval History: has no complaint of nausea or vomiting. Diarrhea also has improved. Presently patient claims he is feeling hungry. He offers no complaints..  Objective: Vital signs in last 24 hours: Temp:  [97.5 F (36.4 C)-98.9 F (37.2 C)] 97.5 F (36.4 C) (04/13 0635) Pulse Rate:  [64-96] 64 (04/13 0635) Resp:  [16-20] 18 (04/13 0635) BP: (100-143)/(57-86) 100/58 mmHg (04/13 0635) SpO2:  [98 %-100 %] 100 % (04/13 0635) Weight change:   Intake/Output from previous day: 04/12 0701 - 04/13 0700 In: 360 [P.O.:360] Out: -  Intake/Output this shift:    General appearance: alert, cooperative and no distress Resp: diminished breath sounds bilaterally Cardio: regular rate and rhythm GI: Distended but nontender Extremities: edema He has about 2+ edema  Lab Results:  Recent Labs  01/08/16 0339 01/08/16 1118 01/09/16 0614  WBC 3.7*  --  3.5*  HGB 8.5* 9.3* 8.7*  HCT 24.3* 26.5* 25.3*  PLT 62*  --  63*   BMET:  Recent Labs  01/08/16 0339 01/09/16 0614  NA 137 138  K 3.2* 3.0*  CL 103 104  CO2 28 26  GLUCOSE 136* 54*  BUN 32* 31*  CREATININE 3.76* 3.49*  CALCIUM 8.0* 7.9*   No results for input(s): PTH in the last 72 hours. Iron Studies: No results for input(s): IRON, TIBC, TRANSFERRIN, FERRITIN in the last 72 hours.  Studies/Results: [place] Paracentesis  01/08/2016  INDICATION: Symptomatic ascites EXAM: ULTRASOUND GUIDED  PARACENTESIS MEDICATIONS: None. COMPLICATIONS: None immediate. PROCEDURE: Informed written consent was obtained from the patient after a discussion of the risks, benefits and alternatives to treatment. A timeout was performed prior to the initiation of the procedure. Initial ultrasound scanning demonstrates a large amount of ascites within the left lower abdominal quadrant. The left lower abdomen was prepped and draped in the usual sterile fashion. 1% lidocaine was used for local anesthesia. Following this, a Yueh catheter was introduced.  The paracentesis was performed. The catheter was removed and a dressing was applied. The patient tolerated the procedure well without immediate post procedural complication. FINDINGS: A total of approximately 4 L of yellowish fluid was removed. IMPRESSION: Successful ultrasound-guided paracentesis yielding 4 liters of peritoneal fluid. Electronically Signed   By: Marnee SpringJonathon  Watts M.D.   On: 01/08/2016 12:55   Dg Abd Acute W/chest  01/07/2016  CLINICAL DATA:  Cough, abdominal pain, diarrhea for 1 week. EXAM: DG ABDOMEN ACUTE W/ 1V CHEST COMPARISON:  None. FINDINGS: Nonobstructive bowel gas pattern.  No free air organomegaly. Heart is normal size. No confluent airspace opacities or effusions. No acute bony abnormality. IMPRESSION: No acute cardiopulmonary disease. No evidence of bowel obstruction or free air Electronically Signed   By: Charlett NoseKevin  Dover M.D.   On: 01/07/2016 16:24    I have reviewed the patient's current medications.  Assessment/Plan: Problem #1 acute kidney injury superimposed on chronic. Possibly secondary to ATN versus prerenal. His BUN and creatinine is showing some improvement however his creatinine is still higher than his baseline. Problem #2 hypokalemia: His potassium remains low Problem #3 diarrhea has improved Problem #4 anemia: His hemoglobin and hematocrit is below our target goal Problem #5 liver cirrhosis with ascites: He is status post paracentesis yesterday. He is feeling much better. Problem #6 diabetes Problem #7 hypothyroidism Problem #8 chronic renal failure: Stage III. Thought to be secondary to diabetes/hypertension/ischemic/recurrent acute kidney injury. Recently his creatinine has remained high. Plan: We'll increase Aldactone to 50 mg once a day 2] KCl 40 mEq by mouth every 4  hours 3 doses for a total of 120 mEq 3] we'll check renal panel in the morning   LOS: 2 days   Dauntae Derusha S 01/09/2016,8:35 AM

## 2016-01-10 ENCOUNTER — Telehealth: Payer: Self-pay | Admitting: Gastroenterology

## 2016-01-10 DIAGNOSIS — A0472 Enterocolitis due to Clostridium difficile, not specified as recurrent: Secondary | ICD-10-CM

## 2016-01-10 DIAGNOSIS — D696 Thrombocytopenia, unspecified: Secondary | ICD-10-CM | POA: Diagnosis not present

## 2016-01-10 DIAGNOSIS — K7460 Unspecified cirrhosis of liver: Secondary | ICD-10-CM | POA: Diagnosis not present

## 2016-01-10 DIAGNOSIS — N179 Acute kidney failure, unspecified: Secondary | ICD-10-CM | POA: Diagnosis not present

## 2016-01-10 DIAGNOSIS — A047 Enterocolitis due to Clostridium difficile: Principal | ICD-10-CM

## 2016-01-10 DIAGNOSIS — R188 Other ascites: Secondary | ICD-10-CM | POA: Diagnosis not present

## 2016-01-10 LAB — COMPREHENSIVE METABOLIC PANEL
ALT: 15 U/L — AB (ref 17–63)
AST: 22 U/L (ref 15–41)
Albumin: 2.2 g/dL — ABNORMAL LOW (ref 3.5–5.0)
Alkaline Phosphatase: 98 U/L (ref 38–126)
Anion gap: 6 (ref 5–15)
BILIRUBIN TOTAL: 0.9 mg/dL (ref 0.3–1.2)
BUN: 30 mg/dL — AB (ref 6–20)
CALCIUM: 8 mg/dL — AB (ref 8.9–10.3)
CO2: 26 mmol/L (ref 22–32)
CREATININE: 3.27 mg/dL — AB (ref 0.61–1.24)
Chloride: 107 mmol/L (ref 101–111)
GFR, EST AFRICAN AMERICAN: 21 mL/min — AB (ref >60)
GFR, EST NON AFRICAN AMERICAN: 18 mL/min — AB (ref >60)
Glucose, Bld: 76 mg/dL (ref 65–99)
Potassium: 4 mmol/L (ref 3.5–5.1)
Sodium: 139 mmol/L (ref 135–145)
Total Protein: 4.9 g/dL — ABNORMAL LOW (ref 6.5–8.1)

## 2016-01-10 LAB — GLUCOSE, CAPILLARY
GLUCOSE-CAPILLARY: 58 mg/dL — AB (ref 65–99)
GLUCOSE-CAPILLARY: 81 mg/dL (ref 65–99)
Glucose-Capillary: 66 mg/dL (ref 65–99)
Glucose-Capillary: 148 mg/dL — ABNORMAL HIGH (ref 65–99)
Glucose-Capillary: 104 mg/dL — ABNORMAL HIGH (ref 65–99)

## 2016-01-10 LAB — CBC
HEMATOCRIT: 23.9 % — AB (ref 39.0–52.0)
Hemoglobin: 8.3 g/dL — ABNORMAL LOW (ref 13.0–17.0)
MCH: 32.5 pg (ref 26.0–34.0)
MCHC: 34.7 g/dL (ref 30.0–36.0)
MCV: 93.7 fL (ref 78.0–100.0)
PLATELETS: 64 10*3/uL — AB (ref 150–400)
RBC: 2.55 MIL/uL — AB (ref 4.22–5.81)
RDW: 13.3 % (ref 11.5–15.5)
WBC: 3 10*3/uL — AB (ref 4.0–10.5)

## 2016-01-10 MED ORDER — VANCOMYCIN 50 MG/ML ORAL SOLUTION: 125.0000 mg | Freq: Four times a day (QID) | ORAL | Status: DC

## 2016-01-10 MED ORDER — HYDROCORTISONE ACETATE 25 MG RE SUPP: 25.0000 mg | Freq: Two times a day (BID) | RECTAL | Status: DC

## 2016-01-10 MED ORDER — FAMOTIDINE 20 MG PO TABS
20.0000 mg | ORAL_TABLET | Freq: Every day | ORAL | Status: DC
  Administered 2016-01-11: 20 mg via ORAL
  Filled 2016-01-10: qty 1

## 2016-01-10 MED ORDER — PANTOPRAZOLE SODIUM 40 MG PO TBEC
40.0000 mg | DELAYED_RELEASE_TABLET | Freq: Every day | ORAL | Status: DC
  Administered 2016-01-10: 40 mg via ORAL
  Filled 2016-01-10: qty 1

## 2016-01-10 MED ORDER — VANCOMYCIN 50 MG/ML ORAL SOLUTION
125.0000 mg | Freq: Four times a day (QID) | ORAL | Status: DC
  Administered 2016-01-10 (×4): 125 mg via ORAL
  Filled 2016-01-10 (×14): qty 2.5

## 2016-01-10 MED ORDER — INSULIN GLARGINE 100 UNIT/ML ~~LOC~~ SOLN
25.0000 [IU] | Freq: Every day | SUBCUTANEOUS | Status: DC
  Filled 2016-01-10 (×3): qty 0.25

## 2016-01-10 NOTE — Progress Notes (Signed)
PROGRESS NOTE  [name] [name] [id number] DOB: 11-12-1950 DOA: 01/07/2016 PCP: [name] [name], [name] S, PA-C  Summary: [age] yom with history of cirrhosis with esophageal varices presented with abdominal pain, distention and diarrhea as well as blood in stool.  Assessment/Plan: 1. C Diff colitis. Afebrile, no leukocytosis, hemodynamics stable. Significant watery diarrhea. Gastroenterology following. 2. Anemia of chronic disease, appears to be stable. No evidence of ongoing bleeding. Outpatient evaluation planned by gastroenterology. 3. Acute kidney injury superimposed on chronic kidney disease stage IV. Slowly improving. Appreciate management per nephrology. 4. Cirrhosis with associated ascites, varices, pancytopenia. Status post large volume paracentesis 4/12. 5. Diabetes mellitus type 2. Mild hypoglycemia secondary to limited diet.   Appears stable. Will continue oral antibiotics for C. difficile diarrhea.  Follow up GI recommendations  Levemir decreased again, secondary to hypoglycemia.  Continue management of acute kidney injury per nephrology. (Aldactone, supportive care)  Code Status: full code DVT prophylaxis: SCDs Family Communication: wife at bedside Disposition Plan: home within 1-2 days  Brendia Sacksaniel Tifanny Dollens, MD  [facility] Direct contact:  --Via amion app OR  --[email or web address]; password [email or web address] and click  7PM-7AM contact night coverage as above 01/10/2016, 8:18 AM  LOS: 3 days   Consultants:  GI  Nephrology  Procedures:  4/12 Large volume paracentesis 4 L  HPI/Subjective: Feels improved today, but has had increased watery diarrhea.  Denies nausea, vomiting, or pain. Is eating and breathing okay.   Objective: Filed Vitals:   01/09/16 1430 01/09/16 1830 01/09/16 2035 01/10/16 0514  BP: 134/59 127/61 127/75 92/55  Pulse: 76 80 89 62  Temp: 98.1 F (36.7 C) 98 F (36.7 C) 98.4 F (36.9 C) 98.1 F (36.7 C)  TempSrc:   Oral Oral  Resp: 18 18 20 20   Height:        Weight:      SpO2: 98% 97% 98% 99%    Intake/Output Summary (Last 24 hours) at 01/10/16 0818 Last data filed at 01/09/16 1900  Gross per 24 hour  Intake    480 ml  Output      0 ml  Net    480 ml     Filed Weights   01/07/16 1433 01/07/16 1902  Weight: 108.863 kg (240 lb) 106.2 kg (234 lb 2.1 oz)    Exam:    General:  Appears comfortable, calm. Cardiovascular: Regular rate and rhythm, no murmur, rub or gallop. 1-2+ bilateral lower extremity edema. Respiratory: Clear to auscultation bilaterally, no wheezes, rales or rhonchi. Normal respiratory effort. Abdomen: protuberant, non-tender Psychiatric: grossly normal mood and affect, speech fluent and appropriate  New data reviewed:  Fasting hypoglycemia, 58  Potassium 4.0  BUN 30, Cr 3.27, slightly improved  LFTs unremarkable  CBC without significant change  C. Diff. positive  Scheduled Meds: . atorvastatin  40 mg Oral q1800  . gabapentin  800 mg Oral BID  . hydrocortisone  25 mg Rectal BID  . insulin aspart  0-9 Units Subcutaneous TID WC  . insulin glargine  30 Units Subcutaneous QHS  . levothyroxine  150 mcg Oral QAC breakfast  . magnesium oxide  400 mg Oral Daily  . metroNIDAZOLE  500 mg Oral 3 times per day  . pantoprazole  40 mg Oral Daily  . PARoxetine  10 mg Oral Daily  . rOPINIRole  0.5 mg Oral QHS  . spironolactone  50 mg Oral Daily  . tamsulosin  0.4 mg Oral QHS  . tiZANidine  4 mg Oral QHS   Continuous Infusions:  Principal Problem:   Acute diarrhea Active Problems:   Thrombocytopenia (HCC)   Diabetes mellitus type 2, uncontrolled (HCC)   Hypothyroidism   Cirrhosis (HCC)   Ascites   GI bleed   Hypokalemia   Acute on chronic renal failure (HCC)   Bleeding gastrointestinal   Time spent 20 minutes    By signing my name below, I, [name], attest that this documentation has been prepared under the direction and in the presence of [name] Hallenbeck P. Irene Limbo, MD. Electronically Signed:  [name], Scribe.  01/10/2016 11:25am  I personally performed the services described in this documentation. All medical record entries made by the scribe were at my direction. I have reviewed the chart and agree that the record reflects my personal performance and is accurate and complete. Brendia Sacks, MD

## 2016-01-10 NOTE — Progress Notes (Addendum)
  Assessment/Plan: ADMITTED 4/11 WITH TENSE ASCITES, RECTAL BLEEDING, ABDOMINAL PAIN, AND DIARRHEA. C DIFF PCR POS-RISK FACTORS: CIRRHOSIS, MULTIPLE VISITS TO HEALTH FACILITIES. CLINICALLY IMPROVED/STABLE.  PLAN: 1. FLAGYL 500 MG TID 2. ADD FLORASTOR 3. ANTICIPATE PT NEEDS PARACENTESIS PRIOR TO DISCHARGE OR ON MON OR TUES AS AN OUTPT. 4. D/C PROTONIX. CHANGE TO PEPCID. 5. CONTINUE ANUSOL TO TREAT CHRONIC RECTAL BLEEDING 6. NURSING NEED TO DISCUSS CDIFF PRECAUTION.  Hand-Washing Hand-washing is the No. 1 preventative measure in stopping the spread and infection of C. Diff. It is important to wash hands with warm water and anti-bacterial soap for at least 30 seconds. Alcohol-based hand sanitizers are not effective against C. Diff and will not kill spores that may be on the hands.  Isolation Any person who is infected with C. Diff should remain in a separate bedroom and ideally have a separate bathroom for at least 48 hours after they have stopped having diarrhea during treatment. When you are around a person with this infection, it is important to wear gloves and, if possible, a yellow isolation gown to prevent picking up the spores from their environment. Wash your hands immediately upon leaving their room to kill any spores that you may have accidentally picked up. Separate Hygiene Items The person infected with C. Diff should have their own hygiene items. Do not share washcloths, hand towels, toothbrushes, or linens with an infected person. Linens and clothes should be washed separately from anyone else's clothes with detergent and on the hot setting of the washer and dryer. Daily Cleaning of Affected Areas It is important to clean any areas that the infected person comes into contact with daily. Bathrooms should be cleaned after every visit. Mix 1 part of bleach with 10 parts of water as a cleaner and use on toilet bowls, sinks, sink and toilet handles and doorknobs. Vacuum carpets daily and mop  any hard floors with the bleach solution used for bathroom cleansing to kill spores that are in the environment    Subjective: Since last evaluated, PT HAS LESS ABOMINAL PAIN. STILL HAVING DIARRHEA.  Objective: Vital signs in last 24 hours: Filed Vitals:   01/09/16 2035 01/10/16 0514  BP: 127/75 92/55  Pulse: 89 62  Temp: 98.4 F (36.9 C) 98.1 F (36.7 C)  Resp: 20 20     General appearance: alert, cooperative and no distress Resp: clear to auscultation bilaterally Cardio: regular rate and rhythm GI: soft, non-tender; DISTENDED, bowel sounds normal;  Lab Results:   Hb 8.3-8.7,PLT 64, Cr 3.27  Studies/Results: No results found.  Medications: I have reviewed the patient's current medications.   LOS: 5 days   [name] [name] 03/08/2014, 2:23 PM

## 2016-01-10 NOTE — Telephone Encounter (Signed)
PT ADMITTED WITH BLOODY DIARRHEA AND DIAGNOSED WITH C DIFF. He needs a large volume paracentesis MON APR 17. HE NEEDS ALBUMIN 25 GMS IV AT ONSET AND REPEAT AFTER 4 LS. HE SHOULD HOLD HIS DIURETICS ON THE DAY OF THE PARACENTESIS. HE NEEDS TO STRICTLY FOLLOW A LOW SALT DIET.

## 2016-01-10 NOTE — Progress Notes (Signed)
Results for Darl HouseholderCHURCH, Joselito L (MRN [id number]) as of 01/10/2016 10:25  Ref. Range 01/09/2016 11:53 01/09/2016 17:00 01/09/2016 20:26 01/10/2016 06:38 01/10/2016 07:32  Glucose-Capillary Latest Ref Range: 65-99 mg/dL 409210 (H) 93 811121 (H)  58 (L)  Noted that fasting CBG this am was 58 mg/dl.  Received Lantus 30 units last night. Recommend decreasing Lantus to 26 units daily since fasting blood sugar was less than 70 mg/dl.  Will continue to follow blood sugars while in the hospital. Smith MinceKendra Ruperto Kiernan RN BSN CDE

## 2016-01-10 NOTE — Progress Notes (Signed)
Subjective: Interval History: Patient continued to complain about diarrhea. He states that he has about 3 bowel movements last night. He doesn't have any nausea or vomiting.  Objective: Vital signs in last 24 hours: Temp:  [97.8 F (36.6 C)-98.4 F (36.9 C)] 98.1 F (36.7 C) (04/14 0514) Pulse Rate:  [62-89] 62 (04/14 0514) Resp:  [18-20] 20 (04/14 0514) BP: (92-134)/(55-75) 92/55 mmHg (04/14 0514) SpO2:  [97 %-99 %] 99 % (04/14 0514) Weight change:   Intake/Output from previous day: 04/13 0701 - 04/14 0700 In: 480 [P.O.:480] Out: -  Intake/Output this shift:    Generally patient is alert and in no apparent distress Chest is clear to auscultation Heart exam is regular rate and rhythm no murmur Abdomen: Distended, nontender Extremities 1+ edema bilaterally.  Lab Results:  Recent Labs  01/09/16 0614 01/10/16 0638  WBC 3.5* 3.0*  HGB 8.7* 8.3*  HCT 25.3* 23.9*  PLT 63* 64*   BMET:   Recent Labs  01/09/16 0614 01/10/16 0638  NA 138 139  K 3.0* 4.0  CL 104 107  CO2 26 26  GLUCOSE 54* 76  BUN 31* 30*  CREATININE 3.49* 3.27*  CALCIUM 7.9* 8.0*   No results for input(s): PTH in the last 72 hours. Iron Studies: No results for input(s): IRON, TIBC, TRANSFERRIN, FERRITIN in the last 72 hours.  Studies/Results: [place] Paracentesis  01/08/2016  INDICATION: Symptomatic ascites EXAM: ULTRASOUND GUIDED  PARACENTESIS MEDICATIONS: None. COMPLICATIONS: None immediate. PROCEDURE: Informed written consent was obtained from the patient after a discussion of the risks, benefits and alternatives to treatment. A timeout was performed prior to the initiation of the procedure. Initial ultrasound scanning demonstrates a large amount of ascites within the left lower abdominal quadrant. The left lower abdomen was prepped and draped in the usual sterile fashion. 1% lidocaine was used for local anesthesia. Following this, a Yueh catheter was introduced. The paracentesis was performed. The  catheter was removed and a dressing was applied. The patient tolerated the procedure well without immediate post procedural complication. FINDINGS: A total of approximately 4 L of yellowish fluid was removed. IMPRESSION: Successful ultrasound-guided paracentesis yielding 4 liters of peritoneal fluid. Electronically Signed   By: Marnee SpringJonathon  Watts M.D.   On: 01/08/2016 12:55    I have reviewed the patient's current medications.  Assessment/Plan: Problem #1 acute kidney injury superimposed on chronic. Possibly secondary to ATN versus prerenal. His renal function slowly improving. Problem #2 hypokalemia: Patient on potassium supplement. His potassium is 4 has corrected Problem #3 diarrhea due to C. difficile colitis. Patient is still with diarrhea. He doesn't have any abdominal pain. Problem #4 anemia: His hemoglobin and hematocrit is low but stable Problem #5 liver cirrhosis with ascites: He is status post paracentesis yesterday. He is feeling much better. Problem #6 diabetes Problem #7 hypothyroidism Problem #8 chronic renal failure: Stage III. Thought to be secondary to diabetes/hypertension/ischemic/recurrent acute kidney injury. Recently his creatinine has remained high. Plan:1] We'll continue his Aldactone. 2] patient has finished his potassium supplement.  3] we'll check renal panel in the morning   LOS: 3 days   Joscelyn Hardrick S 01/10/2016,8:24 AM

## 2016-01-11 DIAGNOSIS — A047 Enterocolitis due to Clostridium difficile: Secondary | ICD-10-CM | POA: Diagnosis not present

## 2016-01-11 DIAGNOSIS — N179 Acute kidney failure, unspecified: Secondary | ICD-10-CM | POA: Diagnosis not present

## 2016-01-11 DIAGNOSIS — K7460 Unspecified cirrhosis of liver: Secondary | ICD-10-CM | POA: Diagnosis not present

## 2016-01-11 DIAGNOSIS — D696 Thrombocytopenia, unspecified: Secondary | ICD-10-CM | POA: Diagnosis not present

## 2016-01-11 LAB — GLUCOSE, CAPILLARY: GLUCOSE-CAPILLARY: 87 mg/dL (ref 65–99)

## 2016-01-11 LAB — RENAL FUNCTION PANEL
Albumin: 2.3 g/dL — ABNORMAL LOW (ref 3.5–5.0)
Anion gap: 6 (ref 5–15)
BUN: 31 mg/dL — AB (ref 6–20)
CHLORIDE: 107 mmol/L (ref 101–111)
CO2: 26 mmol/L (ref 22–32)
Calcium: 8.1 mg/dL — ABNORMAL LOW (ref 8.9–10.3)
Creatinine, Ser: 3.29 mg/dL — ABNORMAL HIGH (ref 0.61–1.24)
GFR calc Af Amer: 21 mL/min — ABNORMAL LOW (ref >60)
GFR, EST NON AFRICAN AMERICAN: 18 mL/min — AB (ref >60)
GLUCOSE: 105 mg/dL — AB (ref 65–99)
POTASSIUM: 4.4 mmol/L (ref 3.5–5.1)
Phosphorus: 2.8 mg/dL (ref 2.5–4.6)
Sodium: 139 mmol/L (ref 135–145)

## 2016-01-11 MED ORDER — TORSEMIDE 20 MG PO TABS: 20.0000 mg | ORAL_TABLET | Freq: Every day | ORAL | Status: DC

## 2016-01-11 MED ORDER — FAMOTIDINE 20 MG PO TABS: 20.0000 mg | ORAL_TABLET | Freq: Every day | ORAL | Status: DC

## 2016-01-11 MED ORDER — SPIRONOLACTONE 25 MG PO TABS
25.0000 mg | ORAL_TABLET | Freq: Every day | ORAL | Status: DC
  Administered 2016-01-11: 25 mg via ORAL

## 2016-01-11 MED ORDER — INSULIN GLARGINE 100 UNIT/ML ~~LOC~~ SOLN: 5.0000 [IU] | Freq: Every day | SUBCUTANEOUS | Status: DC

## 2016-01-11 NOTE — Discharge Summary (Signed)
Physician Discharge Summary  [name] [name] XBJ:[id number] DOB: 16-Aug-1951 DOA: 01/07/2016  PCP: [name], PA-C  Admit date: 01/07/2016 Discharge date: 01/11/2016  Recommendations for Outpatient Follow-up:  1. Follow-up resolution of C. difficile colitis, diarrhea 2. Follow-up chronic kidney disease stage IV 3. Follow-up cirrhosis. Gastroenterology making arrangements for outpatient paracentesis 4/17.   Follow-up Information    Schedule an appointment as soon as possible for a visit with [name], PA-C.   Specialty:  General Practice   Why:  As needed   Contact information:   [address] [place] [address] [phone]       Follow up with [name], MD. Schedule an appointment as soon as possible for a visit in 1 week.   Specialty:  Gastroenterology   Contact information:   [address] [place] [address] [phone]       Follow up with [name], MD In 4 weeks.   Specialty:  Nephrology   Why:  Basic metabolic panel before coming to the office   Contact information:   [address] [place] [address] [phone]       Follow up with [name], PA-C. Schedule an appointment as soon as possible for a visit in 1 week.   Specialty:  General Practice   Contact information:   [phone] [place] [address] [phone]       Follow up with [name], MD.   Specialty:  Gastroenterology   Why:  keep appointment already arranged   Contact information:   [address] [place] [address] [phone]      Discharge Diagnoses:  1. Clostridium difficile colitis, diarrhea (present on admission) 2. Anemia of chronic disease, stable 3. Acute kidney injury 4. Chronic kidney disease stage IV 5. Cirrhosis with associated ascites, varices, pancytopenia 6. DM type 2  Discharge Condition: improved Disposition: home  Diet recommendation: carb modified.  Filed Weights   01/07/16 1433 01/07/16 1902    Weight: 108.863 kg (240 lb) 106.2 kg (234 lb 2.1 oz)    History of present illness:  69 yom with history of cirrhosis with esophageal varices presented with abdominal pain, distention and diarrhea as well as blood in stool  Hospital Course:  Admitted for diarrhea, initially unimpressive, however C. difficile test was positive and the patient developed more watery diarrhea. A paracentesis was successfully performed on 4/12 which drained 4L of fluid and proved symptomatically beneficial. He had no significant bleeding and hemoglobin overall mood remained stable. Gastroenterology recommended conservative care with outpatient follow-up. He was seen by nephrology for acute kidney injury. Renal function did stabilize and plans were made for outpatient follow-up.  Assessment:  C Diff colitis. Rapidly improving. Remains afebrile, tolerating diet.  Anemia of chronic disease, stable. Outpatient evaluation planned by gastroenterology.  AKI superimposed on CKD stage IV. Appears to have stabilized. Discussed with nephrology, plan discharge home with outpatient follow-up. Aldactone 25 mg daily and torsemide 20 mg daily.  Cirrhosis with associated ascites, varices, pancytopenia. Status post large volume paracentesis 4/12.  Diabetes mellitus type 2. Stable.   Overall improved, diarrhea much improved. Blood sugars stable.  Home today on oral vancomycin. Lantus decreased. Aldactone 25 mg and torsemide 20 mg daily per Dr. [name]. Follow-up next week for paracentesis as an outpatient with gastroenterology.  Consultants:  GI  Nephrology  Procedures:  4/12 Large volume paracentesis 4 L  Antibiotics:  Oral vancomycin 4/14 >>4/28  Discharge Instructions  Discharge Instructions  Diet - low sodium heart healthy    Complete by:  As directed      Diet Carb Modified    Complete by:  As directed      Discharge instructions    Complete by:  As directed   Call your physician or seek immediate  medical attention for diarrhea, fever, bleeding, confusion, weakness or worsening of condition.     Increase activity slowly    Complete by:  As directed           Discharge Medication List as of 01/11/2016 10:29 AM    START taking these medications   Details  famotidine (PEPCID) 20 MG tablet Take 1 tablet (20 mg total) by mouth daily before breakfast., Starting 01/11/2016, Until Discontinued, Normal    hydrocortisone (ANUSOL-HC) 25 MG suppository Place 1 suppository (25 mg total) rectally 2 (two) times daily., Starting 01/10/2016, Until Discontinued, Normal    vancomycin (VANCOCIN) 50 mg/mL oral solution Take 2.5 mLs (125 mg total) by mouth 4 (four) times daily., Starting 01/10/2016, Until Discontinued, Phone In      CONTINUE these medications which have CHANGED   Details  insulin glargine (LANTUS) 100 UNIT/ML injection Inject 0.05 mLs (5 Units total) into the skin at bedtime. Do not take if blood sugar less than 150., Starting 01/11/2016, Until Discontinued, No Print    torsemide (DEMADEX) 20 MG tablet Take 1 tablet (20 mg total) by mouth daily., Starting 01/11/2016, Until Discontinued, No Print      CONTINUE these medications which have NOT CHANGED   Details  ALPRAZolam (XANAX) 1 MG tablet Take 1 mg by mouth 3 (three) times daily as needed for anxiety or sleep. , Starting 12/14/2015, Until Discontinued, Historical Med    gabapentin (NEURONTIN) 800 MG tablet Take 800 mg by mouth 2 (two) times daily. , Starting 07/27/2015, Until Discontinued, Historical Med    levothyroxine (SYNTHROID, LEVOTHROID) 150 MCG tablet Take 150 mcg by mouth daily before breakfast. , Starting 07/27/2015, Until Discontinued, Historical Med    Magnesium 400 MG CAPS Take 400 mg by mouth daily. , Until Discontinued, Historical Med    oxyCODONE (OXY IR/ROXICODONE) 5 MG immediate release tablet Take 1 tablet (5 mg total) by mouth every 4 (four) hours as needed for moderate pain., Starting 10/15/2015, Until  Discontinued, Print    PARoxetine (PAXIL) 10 MG tablet Take 10 mg by mouth daily. Reported on 11/27/2015, Starting 09/21/2015, Until Discontinued, Historical Med    PROAIR HFA 108 (90 Base) MCG/ACT inhaler Inhale 1-2 puffs into the lungs every 6 (six) hours as needed for wheezing or shortness of breath. , Starting 09/21/2015, Until Discontinued, Historical Med    rOPINIRole (REQUIP) 0.5 MG tablet Take 0.5 mg by mouth at bedtime. , Starting 07/27/2015, Until Discontinued, Historical Med    simvastatin (ZOCOR) 80 MG tablet Take 80 mg by mouth at bedtime. , Starting 09/21/2015, Until Discontinued, Historical Med    spironolactone (ALDACTONE) 25 MG tablet Take 25 mg by mouth daily., Starting 01/01/2016, Until Discontinued, Historical Med    tamsulosin (FLOMAX) 0.4 MG CAPS capsule Take 0.4 mg by mouth at bedtime. , Starting 07/27/2015, Until Discontinued, Historical Med    tiZANidine (ZANAFLEX) 4 MG tablet Take 4 mg by mouth at bedtime. , Starting 07/27/2015, Until Discontinued, Historical Med      STOP taking these medications     insulin lispro (HUMALOG) 100 UNIT/ML injection      omeprazole (PRILOSEC) 20 MG capsule      insulin aspart (NOVOLOG) 100  UNIT/ML injection      lactulose (CHRONULAC) 10 GM/15ML solution      ondansetron (ZOFRAN) 4 MG tablet        No Known Allergies  The results of significant diagnostics from this hospitalization (including imaging, microbiology, ancillary and laboratory) are listed below for reference.    Significant Diagnostic Studies: US Paracentesis  01/08/2016  INDICATION: Symptomatic ascites EXAM: ULTRASOUND GUIDED  PARACENTESIS MEDICATIONS: None. COMPLICATIONS: None immediate. PROCEDURE: Informed written consent was obtained from the patient after a discussion of the risks, benefits and alternatives to treatment. A timeout was performed prior to the initiation of the procedure. Initial ultrasound scanning demonstrates a large amount of ascites within  the left lower abdominal quadrant. The left lower abdomen was prepped and draped in the usual sterile fashion. 1% lidocaine was used for local anesthesia. Following this, a Yueh catheter was introduced. The paracentesis was performed. The catheter was removed and a dressing was applied. The patient tolerated the procedure well without immediate post procedural complication. FINDINGS: A total of approximately 4 L of yellowish fluid was removed. IMPRESSION: Successful ultrasound-guided paracentesis yielding 4 liters of peritoneal fluid. Electronically Signed   By: Marnee Spring M.D.   On: 01/08/2016 12:55   US Paracentesis  12/24/2015  INDICATION: Cirrhosis, ascites EXAM: ULTRASOUND GUIDED DIAGNOSTIC AND THERAPEUTIC PARACENTESIS PROCEDURE: Procedure, benefits, and risks of procedure were discussed with patient. Written informed consent for procedure was obtained. Time out protocol followed. Ascites localized by ultrasound in the LEFT lower quadrant Skin prepped and draped in usual sterile fashion. Skin and soft tissues anesthetized with 10 mL of 1% lidocaine. 5 [place] Yueh catheter placed into the ascites in the LEFT lower quadrant. 8500 mL of yellow ascitic fluid was aspirated by vacuum bottle suction. Procedure tolerated well by patient without immediate complication. FINDINGS: A total of approximately 8500 mL of ascitic fluid was removed. A fluid sample of 180 mL was sent for requested laboratory analysis. IMPRESSION: Successful ultrasound guided paracentesis yielding 8500 mL of ascitic fluid. Electronically Signed   By: Ulyses Southward M.D.   On: 12/24/2015 13:03   Dg Abd Acute W/chest  01/07/2016  CLINICAL DATA:  Cough, abdominal pain, diarrhea for 1 week. EXAM: DG ABDOMEN ACUTE W/ 1V CHEST COMPARISON:  None. FINDINGS: Nonobstructive bowel gas pattern.  No free air organomegaly. Heart is normal size. No confluent airspace opacities or effusions. No acute bony abnormality. IMPRESSION: No acute cardiopulmonary  disease. No evidence of bowel obstruction or free air Electronically Signed   By: Charlett Nose M.D.   On: 01/07/2016 16:24    Microbiology: Recent Results (from the past 240 hour(s))  C difficile quick scan w PCR reflex     Status: Abnormal   Collection Time: 01/09/16 11:00 AM  Result Value Ref Range Status   C Diff antigen POSITIVE (A) NEGATIVE Final   C Diff toxin POSITIVE (A) NEGATIVE Final   C Diff interpretation Positive for toxigenic C. difficile  Final    Comment: CRITICAL RESULT CALLED TO, READ BACK BY AND VERIFIED WITH: DILDY,V. RN AT 1412 ON 01/26/2016 BY BAUGHAM,M.      Labs: Basic Metabolic Panel:  Recent Labs Lab 01/07/16 1453 01/08/16 0339 01/09/16 0614 01/10/16 0638 01/11/16 0539  NA 137 137 138 139 139  K 3.0* 3.2* 3.0* 4.0 4.4  CL 101 103 104 107 107  CO2 GLUCOSE 179* 136* 54* 76 105*  BUN 32* 32* 31* 30* 31*  CREATININE 3.60* 3.76* 3.49*  3.27* 3.29*  CALCIUM 8.3* 8.0* 7.9* 8.0* 8.1*  PHOS  --   --   --   --  2.8   Liver Function Tests:  Recent Labs Lab 01/07/16 1453 01/08/16 1407 01/10/16 0638 01/11/16 0539  AST 36 29 22  --   ALT 21 18 15*  --   ALKPHOS 120 118 98  --   BILITOT 2.0* 1.4* 0.9  --   PROT 6.5 6.2* 4.9*  --   ALBUMIN 2.8* 2.9* 2.2* 2.3*    Recent Labs Lab 01/07/16 1453  LIPASE 36    Recent Labs Lab 01/07/16 1455  AMMONIA 32   CBC:  Recent Labs Lab 01/07/16 1453 01/07/16 1949 01/08/16 0339 01/08/16 1118 01/09/16 0614 01/10/16 0638  WBC 7.1  --  3.7*  --  3.5* 3.0*  NEUTROABS 4.7  --   --   --   --   --   HGB 10.2* 10.0* 8.5* 9.3* 8.7* 8.3*  HCT 29.1* 28.1* 24.3* 26.5* 25.3* 23.9*  MCV 93.9  --  93.5  --  93.0 93.7  PLT 104*  --  62*  --  63* 64*   Cardiac Enzymes:  Recent Labs Lab 01/07/16 1453  TROPONINI <0.03       Recent Labs  01/07/16 1453  BNP 94.0     CBG:  Recent Labs Lab 01/10/16 0840 01/10/16 1123 01/10/16 1643 01/10/16 2134 01/11/16 0801  GLUCAP 66 148* 81  104* 87    Principal Problem:   C. difficile diarrhea Active Problems:   Thrombocytopenia (HCC)   Diabetes mellitus type 2, uncontrolled (HCC)   Hypothyroidism   Cirrhosis (HCC)   Ascites   GI bleed   Acute diarrhea   Hypokalemia   Acute on chronic renal failure (HCC)   Bleeding gastrointestinal   C. difficile colitis   Time coordinating discharge: 35 minutes  Signed:  Brendia Sacksaniel Pinki Rottman, MD [facility] 01/11/2016, 3:56 PM  By signing my name below, I, Adron BeneGreylon Gawaluck, attest that this documentation has been prepared under the direction and in the presence of Xianna Siverling P. Irene LimboGoodrich, MD. Electronically Signed: Adron BeneGreylon Gawaluck, Scribe.  01/11/2016 9:30am  I personally performed the services described in this documentation. All medical record entries made by the scribe were at my direction. I have reviewed the chart and agree that the record reflects my personal performance and is accurate and complete. Brendia Sacksaniel Asser Lucena, MD

## 2016-01-11 NOTE — Progress Notes (Signed)
Subjective: Interval History: His diarrhea is better. No nausea or vomting  Objective: Vital signs in last 24 hours: Temp:  [98 F (36.7 C)-98.4 F (36.9 C)] 98 F (36.7 C) (04/15 0612) Pulse Rate:  [60-78] 60 (04/15 0612) Resp:  [20] 20 (04/15 0612) BP: (84-128)/(50-71) 84/50 mmHg (04/15 0612) SpO2:  [98 %] 98 % (04/15 0612) Weight change:   Intake/Output from previous day: 04/14 0701 - 04/15 0700 In: 720 [P.O.:720] Out: -  Intake/Output this shift:    Generally patient is alert and in no apparent distress Chest is clear to auscultation Heart exam is regular rate and rhythm no murmur Abdomen: Distended, nontender Extremities 1+ edema bilaterally.  Lab Results:  Recent Labs  01/09/16 0614 01/10/16 0638  WBC 3.5* 3.0*  HGB 8.7* 8.3*  HCT 25.3* 23.9*  PLT 63* 64*   BMET:   Recent Labs  01/10/16 0638 01/11/16 0539  NA 139 139  K 4.0 4.4  CL 107 107  CO2 26 26  GLUCOSE 76 105*  BUN 30* 31*  CREATININE 3.27* 3.29*  CALCIUM 8.0* 8.1*   No results for input(s): PTH in the last 72 hours. Iron Studies: No results for input(s): IRON, TIBC, TRANSFERRIN, FERRITIN in the last 72 hours.  Studies/Results: No results found.  I have reviewed the patient's current medications.  Assessment/Plan: Problem #1 acute kidney injury superimposed on chronic. Possibly secondary to ATN versus prerenal. His renal function is stable and no uremic sig and symptoms Problem #2 hypokalemia: Patient on potassium supplement. His potassium is has corrected. Potassium supplement is discontinued and only on Aldacto Problem #3 diarrhea due to C. difficile colitis. Patient is still with diarrhea. Improving. He has only one episode since yesterday. Problem #4 anemia: His hemoglobin and hematocrit is low but stable Problem #5 liver cirrhosis with ascites: He is status post paracentesis yesterday. He is feeling much better. Problem #6 diabetes Problem #7 hypothyroidism: On synthoid Problem  #8 chronic renal failure: Stage III. Thought to be secondary to diabetes/hypertension/ischemic/recurrent acute kidney injury. Recently his creatinine has remained high. Plan:1] We'll decrease Aldactone to 25 mg po once a day 2] We will follow patient in 4 weeks as an out patient   LOS: 4 days   Gayatri Teasdale S 01/11/2016,9:34 AM

## 2016-01-11 NOTE — Progress Notes (Signed)
PROGRESS NOTE  [name] [name] ZOX:[id number] DOB: July 26, 1951 DOA: 01/07/2016 PCP: Remus LofflerJones, [name], PA-C  Summary: [age] yom with history of cirrhosis with esophageal varices presented with abdominal pain, distention and diarrhea as well as blood in stool.  Assessment/Plan: 1. C Diff colitis. Rapidly improving. Remains afebrile, tolerating diet. 2. Anemia of chronic disease, stable. Outpatient evaluation planned by gastroenterology. 3. AKI superimposed on CKD stage IV. Appears to have stabilized. Discussed with nephrology, plan discharge home with outpatient follow-up. Aldactone 25 mg daily and torsemide 20 mg daily. 4. Cirrhosis with associated ascites, varices, pancytopenia. Status post large volume paracentesis 4/12. 5. Diabetes mellitus type 2. Stable.   Overall improved, diarrhea much improved. Blood sugars stable.  Home today on oral vancomycin. Lantus decreased. Aldactone 25 mg and torsemide 20 mg daily per Dr. Kristian CoveyBefekadu. Follow-up next week for paracentesis as an outpatient with gastroenterology.  Code Status: full code DVT prophylaxis: SCDs Family Communication: wife at bedside Disposition Plan: home today  Brendia Sacksaniel Goodrich, MD  [facility] Direct contact:  --Via amion app OR  --[email or web address]; password [email or web address] and click  7PM-7AM contact night coverage as above 01/11/2016, 7:26 AM  LOS: 4 days   Consultants:  GI  Nephrology  Procedures:  4/12 Large volume paracentesis 4 L  HPI/Subjective: Feels improved, breathing is good. Only had 2 episodes of diarrhea since last night.   Objective: Filed Vitals:   01/10/16 1516 01/10/16 1538 01/10/16 1938 01/11/16 0612  BP: 127/68  128/71 84/50  Pulse: 70  78 60  Temp: 98 F (36.7 C)  98.4 F (36.9 C) 98 F (36.7 C)  TempSrc: Oral  Oral Oral  Resp: 20  20 20   Height:      Weight:      SpO2: 98% 98% 98% 98%    Intake/Output Summary (Last 24 hours) at 01/11/16 0726 Last data filed at 01/10/16 1700  Gross per 24 hour    Intake    720 ml  Output      0 ml  Net    720 ml     Filed Weights   01/07/16 1433 01/07/16 1902  Weight: 108.863 kg (240 lb) 106.2 kg (234 lb 2.1 oz)    Exam:    General:  Appears calm and comfortable Cardiovascular: RRR, no m/r/g. 1+ BLE edema, improved Respiratory: CTA bilaterally, no w/r/r. Normal respiratory effort. Abdomen: Protuberant, non-tender Psychiatric: grossly normal mood and affect, speech fluent and appropriate  New data reviewed:  Glucose 105  Potassium 4.4   BUN 31, Cr 3.29, BMP stable  Scheduled Meds: . atorvastatin  40 mg Oral q1800  . famotidine  20 mg Oral QAC breakfast  . gabapentin  800 mg Oral BID  . hydrocortisone  25 mg Rectal BID  . insulin aspart  0-9 Units Subcutaneous TID WC  . insulin glargine  25 Units Subcutaneous QHS  . levothyroxine  150 mcg Oral QAC breakfast  . PARoxetine  10 mg Oral Daily  . rOPINIRole  0.5 mg Oral QHS  . spironolactone  50 mg Oral Daily  . tamsulosin  0.4 mg Oral QHS  . tiZANidine  4 mg Oral QHS  . vancomycin  125 mg Oral QID   Continuous Infusions:    Principal Problem:   C. difficile diarrhea Active Problems:   Thrombocytopenia (HCC)   Diabetes mellitus type 2, uncontrolled (HCC)   Hypothyroidism   Cirrhosis (HCC)   Ascites   GI bleed   Acute diarrhea   Hypokalemia   Acute on  chronic renal failure (HCC)   Bleeding gastrointestinal   C. difficile colitis   By signing my name below, I, [name], attest that this documentation has been prepared under the direction and in the presence of Daniel P. Irene Limbo, MD. Electronically Signed: [name], Scribe.  01/11/2016 9:25am   I personally performed the services described in this documentation. All medical record entries made by the scribe were at my direction. I have reviewed the chart and agree that the record reflects my personal performance and is accurate and complete. Brendia Sacks, MD

## 2016-01-13 ENCOUNTER — Other Ambulatory Visit: Payer: Self-pay

## 2016-01-13 ENCOUNTER — Encounter (HOSPITAL_COMMUNITY): Payer: Self-pay

## 2016-01-13 ENCOUNTER — Ambulatory Visit (HOSPITAL_COMMUNITY)
Admission: RE | Admit: 2016-01-13 | Discharge: 2016-01-13 | Disposition: A | Discharge status: Home / Self Care | Payer: Medicare Other | Source: Ambulatory Visit | Attending: Gastroenterology | Admitting: Gastroenterology

## 2016-01-13 DIAGNOSIS — R188 Other ascites: Secondary | ICD-10-CM | POA: Diagnosis not present

## 2016-01-13 DIAGNOSIS — K7460 Unspecified cirrhosis of liver: Secondary | ICD-10-CM | POA: Insufficient documentation

## 2016-01-13 MED ORDER — ALBUMIN HUMAN 25 % IV SOLN
INTRAVENOUS | Status: AC
  Administered 2016-01-13: 25 g via INTRAVENOUS
  Filled 2016-01-13: qty 100

## 2016-01-13 MED ORDER — ALBUMIN HUMAN 25 % IV SOLN
25.0000 g | Freq: Once | INTRAVENOUS | Status: AC
  Administered 2016-01-13: 25 g via INTRAVENOUS

## 2016-01-13 NOTE — Telephone Encounter (Signed)
Candy, please schedule para

## 2016-01-13 NOTE — Telephone Encounter (Signed)
Pt is set for para today at 1300. [facility] Also left message on pt message

## 2016-01-13 NOTE — Progress Notes (Signed)
Paracentesis complete no signs of distress. 9000 ml yellow colored ascites removed.   

## 2016-01-13 NOTE — Procedures (Signed)
PreOperative Dx: Cirrhosis, ascites Postoperative Dx: Cirrhosis, ascites Procedure:   US guided paracentesis Radiologist:  Tyron RussellBoles Anesthesia:  10 ml of1% lidocaine Specimen:  9000ml of yellow ascitic fluid EBL:   < 1 ml Complications: None

## 2016-01-14 ENCOUNTER — Telehealth: Payer: Self-pay | Admitting: Internal Medicine

## 2016-01-14 ENCOUNTER — Encounter: Payer: Self-pay | Admitting: Internal Medicine

## 2016-01-14 ENCOUNTER — Ambulatory Visit: Payer: Medicare Other | Admitting: Internal Medicine

## 2016-01-14 NOTE — Telephone Encounter (Signed)
Noted  

## 2016-01-14 NOTE — Telephone Encounter (Signed)
PT WAS A NO SHOW AND LETTER SENT  °

## 2016-01-24 ENCOUNTER — Encounter: Payer: Self-pay | Admitting: Internal Medicine

## 2016-01-24 ENCOUNTER — Other Ambulatory Visit: Payer: Self-pay

## 2016-01-24 ENCOUNTER — Ambulatory Visit (INDEPENDENT_AMBULATORY_CARE_PROVIDER_SITE_OTHER): Payer: Medicare Other | Admitting: Internal Medicine

## 2016-01-24 ENCOUNTER — Other Ambulatory Visit: Payer: Self-pay | Admitting: Internal Medicine

## 2016-01-24 VITALS — BP 103/65 | HR 82 | Temp 97.0°F | Ht 69.0 in | Wt 214.8 lb

## 2016-01-24 DIAGNOSIS — A498 Other bacterial infections of unspecified site: Secondary | ICD-10-CM

## 2016-01-24 DIAGNOSIS — R188 Other ascites: Secondary | ICD-10-CM

## 2016-01-24 DIAGNOSIS — K7031 Alcoholic cirrhosis of liver with ascites: Secondary | ICD-10-CM | POA: Diagnosis not present

## 2016-01-24 DIAGNOSIS — B9689 Other specified bacterial agents as the cause of diseases classified elsewhere: Secondary | ICD-10-CM

## 2016-01-24 DIAGNOSIS — K219 Gastro-esophageal reflux disease without esophagitis: Secondary | ICD-10-CM | POA: Diagnosis not present

## 2016-01-24 DIAGNOSIS — K7460 Unspecified cirrhosis of liver: Secondary | ICD-10-CM

## 2016-01-24 LAB — CBC
HCT: 30.7 % — ABNORMAL LOW (ref 38.5–50.0)
Hemoglobin: 10.4 g/dL — ABNORMAL LOW (ref 13.2–17.1)
MCH: 31.8 pg (ref 27.0–33.0)
MCHC: 33.9 g/dL (ref 32.0–36.0)
MCV: 93.9 fL (ref 80.0–100.0)
MPV: 9.3 fL (ref 7.5–12.5)
PLATELETS: 72 10*3/uL — AB (ref 140–400)
RBC: 3.27 MIL/uL — ABNORMAL LOW (ref 4.20–5.80)
RDW: 14.1 % (ref 11.0–15.0)
WBC: 4.2 10*3/uL (ref 3.8–10.8)

## 2016-01-24 LAB — HEPATITIS B SURFACE ANTIBODY,QUALITATIVE: HEP B S AB: NEGATIVE

## 2016-01-24 LAB — HEPATITIS A ANTIBODY, IGM: Hep A IgM: NONREACTIVE
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
## 2016-01-24 NOTE — Patient Instructions (Addendum)
Information on a 2 gram sodium diet  Continue Pepsid for GERD  See kidney doctor next week about fluid pills  CBC today with other labs  Hepatitis B surface antibody and hepatitis A antibody if not already done  [facility]  Repeat EGD in 1 year  Office visit in 6 weeks  Liver ultrasound every 6 months  Complete vancomycin treatment

## 2016-01-27 ENCOUNTER — Encounter: Payer: Self-pay | Admitting: Gastroenterology

## 2016-01-27 LAB — HEPATITIS A ANTIBODY, TOTAL: HEP A TOTAL AB: NONREACTIVE

## 2016-02-10 ENCOUNTER — Encounter: Payer: Self-pay | Admitting: Internal Medicine

## 2016-02-11 ENCOUNTER — Telehealth: Payer: Self-pay | Admitting: Internal Medicine

## 2016-02-11 NOTE — Telephone Encounter (Signed)
Daughter, Neysa BonitoChristy, called to say that patient needs to have fluid drawn off. Please advise and call [phone]

## 2016-02-11 NOTE — Telephone Encounter (Signed)
Please advise 

## 2016-02-12 ENCOUNTER — Other Ambulatory Visit: Payer: Self-pay

## 2016-02-12 ENCOUNTER — Ambulatory Visit (HOSPITAL_COMMUNITY): Admission: RE | Admit: 2016-02-12 | Payer: Medicare Other | Source: Ambulatory Visit

## 2016-02-12 DIAGNOSIS — R188 Other ascites: Secondary | ICD-10-CM

## 2016-02-12 NOTE — Telephone Encounter (Signed)
Please make standing order for large volume abdominal paracentesis.   1. Give albumin IV 25 grams at onset of LVAP. For every 2 liters of ascites removed after the first initial 4 liters, give additional 12.5 grams of IV albumin.   2. Send fluid for cell count with diff and body fluid cultures.

## 2016-02-12 NOTE — Telephone Encounter (Signed)
Standing orders are in and he is set up to have a PARA today at 12:00. I left message for daughter with time to be there.

## 2016-02-13 ENCOUNTER — Telehealth: Payer: Self-pay | Admitting: Gastroenterology

## 2016-02-13 ENCOUNTER — Ambulatory Visit (HOSPITAL_COMMUNITY)
Admission: RE | Admit: 2016-02-13 | Discharge: 2016-02-13 | Disposition: A | Discharge status: Home / Self Care | Payer: Medicare Other | Source: Ambulatory Visit | Attending: Gastroenterology | Admitting: Gastroenterology

## 2016-02-13 DIAGNOSIS — R188 Other ascites: Secondary | ICD-10-CM | POA: Diagnosis not present

## 2016-02-13 LAB — BODY FLUID CELL COUNT WITH DIFFERENTIAL
Eos, Fluid: 0 %
Lymphs, Fluid: 68 %
Monocyte-Macrophage-Serous Fluid: 29 % — ABNORMAL LOW (ref 50–90)
Neutrophil Count, Fluid: 3 % (ref 0–25)
Total Nucleated Cell Count, Fluid: 90 cu mm (ref 0–1000)

## 2016-02-13 LAB — GRAM STAIN

## 2016-02-13 MED ORDER — ALBUMIN HUMAN 25 % IV SOLN
50.0000 g | Freq: Once | INTRAVENOUS | Status: AC
Start: 2016-02-13 — End: 2016-02-13
  Administered 2016-02-13: 50 g via INTRAVENOUS

## 2016-02-13 MED ORDER — ALBUMIN HUMAN 25 % IV SOLN
INTRAVENOUS | Status: AC
  Administered 2016-02-13: 50 g via INTRAVENOUS
  Filled 2016-02-13: qty 200

## 2016-02-13 NOTE — Telephone Encounter (Signed)
[facility]. None on file per fax received.

## 2016-02-13 NOTE — Progress Notes (Signed)
Paracentesis complete no signs of distress. 8000 ml yellow colored ascites removed.  

## 2016-02-14 LAB — PATHOLOGIST SMEAR REVIEW

## 2016-02-14 NOTE — Progress Notes (Signed)
Quick Note:  For some reason, radiology did a cytology on ascitic fluid. Let's make sure that that is not listed on the standing orders. Should only be cell count with diff and fluid culture. ______

## 2016-02-18 LAB — CULTURE, BODY FLUID W GRAM STAIN -BOTTLE: Culture: NO GROWTH

## 2016-03-02 ENCOUNTER — Ambulatory Visit: Payer: Medicare Other | Admitting: Nurse Practitioner

## 2016-03-02 ENCOUNTER — Other Ambulatory Visit: Payer: Self-pay | Admitting: Gastroenterology

## 2016-03-02 ENCOUNTER — Other Ambulatory Visit: Payer: Self-pay

## 2016-03-02 DIAGNOSIS — R188 Other ascites: Secondary | ICD-10-CM

## 2016-03-03 ENCOUNTER — Ambulatory Visit (HOSPITAL_COMMUNITY): Payer: Medicare Other

## 2016-03-03 ENCOUNTER — Encounter (HOSPITAL_COMMUNITY): Payer: Self-pay

## 2016-03-03 ENCOUNTER — Ambulatory Visit (HOSPITAL_COMMUNITY)
Admission: RE | Admit: 2016-03-03 | Discharge: 2016-03-03 | Disposition: A | Discharge status: Home / Self Care | Payer: Medicare Other | Source: Ambulatory Visit | Attending: Gastroenterology | Admitting: Gastroenterology

## 2016-03-03 DIAGNOSIS — R188 Other ascites: Secondary | ICD-10-CM | POA: Insufficient documentation

## 2016-03-03 LAB — GRAM STAIN

## 2016-03-03 LAB — BODY FLUID CELL COUNT WITH DIFFERENTIAL
Eos, Fluid: 0 %
LYMPHS FL: 37 %
Monocyte-Macrophage-Serous Fluid: 60 % (ref 50–90)
NEUTROPHIL FLUID: 3 % (ref 0–25)
Total Nucleated Cell Count, Fluid: 153 cu mm (ref 0–1000)

## 2016-03-03 NOTE — Procedures (Signed)
PreOperative Dx: Cirrhosis, ascites Postoperative Dx: Cirrhosis, ascites Procedure:   US guided paracentesis Radiologist:  Tyron RussellBoles Anesthesia:  10 ml of1% lidocaine Specimen:  6.4 L of None ascitic fluid EBL:   < 1 ml Complications: None

## 2016-03-03 NOTE — Progress Notes (Signed)
Paracentesis complete no signs of distress. 6400 ml yellow colored ascites removed.  

## 2016-03-04 LAB — PATHOLOGIST SMEAR REVIEW

## 2016-03-07 ENCOUNTER — Other Ambulatory Visit: Payer: Self-pay | Admitting: Gastroenterology

## 2016-03-08 LAB — CULTURE, BODY FLUID W GRAM STAIN -BOTTLE: Culture: NO GROWTH

## 2016-03-08 LAB — CULTURE, BODY FLUID-BOTTLE

## 2016-03-23 ENCOUNTER — Other Ambulatory Visit: Payer: Self-pay

## 2016-03-23 ENCOUNTER — Ambulatory Visit (HOSPITAL_COMMUNITY)
Admission: RE | Admit: 2016-03-23 | Discharge: 2016-03-23 | Disposition: A | Discharge status: Home / Self Care | Payer: Medicare Other | Source: Ambulatory Visit | Attending: Gastroenterology | Admitting: Gastroenterology

## 2016-03-23 ENCOUNTER — Telehealth: Payer: Self-pay | Admitting: General Practice

## 2016-03-23 ENCOUNTER — Telehealth: Payer: Self-pay | Admitting: Internal Medicine

## 2016-03-23 DIAGNOSIS — R188 Other ascites: Secondary | ICD-10-CM | POA: Insufficient documentation

## 2016-03-23 LAB — BODY FLUID CELL COUNT WITH DIFFERENTIAL
Eos, Fluid: 0 %
Lymphs, Fluid: 38 %
Monocyte-Macrophage-Serous Fluid: 1 % — ABNORMAL LOW (ref 50–90)
NEUTROPHIL FLUID: 61 % — AB (ref 0–25)
WBC FLUID: 102 uL (ref 0–1000)

## 2016-03-23 LAB — GRAM STAIN

## 2016-03-23 MED ORDER — ALBUMIN HUMAN 25 % IV SOLN
50.0000 g | Freq: Once | INTRAVENOUS | Status: AC
  Administered 2016-03-23: 50 g via INTRAVENOUS

## 2016-03-23 MED ORDER — ALBUMIN HUMAN 25 % IV SOLN
INTRAVENOUS | Status: AC
  Filled 2016-03-23: qty 200

## 2016-03-23 NOTE — Telephone Encounter (Signed)
[phone]   PATIENT WIFE CALLED AND STATED THAT HE NEEDS FLUID DRAWN OFF, NEEDS IT BAD

## 2016-03-23 NOTE — Telephone Encounter (Signed)
Pt is set up for PARA today at 12:00 pm. Family is aware to have [facility] a [facility]

## 2016-03-23 NOTE — Telephone Encounter (Signed)
Patient's wife called in wanting to know the results from the [facility].  I told her that we will give the patient a call when the results are ready.  She thanked me and the call was disconnected

## 2016-03-23 NOTE — Procedures (Signed)
PreOperative Dx: Cirrhosis, ascites Postoperative Dx: Cirrhosis, ascites Procedure:   US guided paracentesis Radiologist:  Verenis Nicosia Anesthesia:  10 ml of1% lidocaine Specimen:  7 L of yellow ascitic fluid EBL:   < 1 ml Complications: None  

## 2016-03-24 LAB — PATHOLOGIST SMEAR REVIEW

## 2016-03-24 NOTE — Progress Notes (Signed)
Quick Note:  Can we please get radiology to STOP doing the cytology every top they tap this patient? It should not be included in the standing orders. If we are not ordering it, someone please call radiology and inquire why they are doing it? ______

## 2016-03-27 NOTE — Telephone Encounter (Signed)
I called the patient, no answer, lmom  

## 2016-03-29 ENCOUNTER — Encounter (HOSPITAL_COMMUNITY): Payer: Self-pay | Admitting: Emergency Medicine

## 2016-03-29 ENCOUNTER — Emergency Department (HOSPITAL_COMMUNITY): Payer: Medicare Other

## 2016-03-29 ENCOUNTER — Inpatient Hospital Stay (HOSPITAL_COMMUNITY)
Admission: EM | Admit: 2016-03-29 | Discharge: 2016-04-04 | DRG: 441 | Disposition: A | Discharge status: Home Health | Payer: Medicare Other | Attending: Internal Medicine | Admitting: Internal Medicine

## 2016-03-29 DIAGNOSIS — J9601 Acute respiratory failure with hypoxia: Secondary | ICD-10-CM | POA: Diagnosis present

## 2016-03-29 DIAGNOSIS — E039 Hypothyroidism, unspecified: Secondary | ICD-10-CM | POA: Diagnosis not present

## 2016-03-29 DIAGNOSIS — J9690 Respiratory failure, unspecified, unspecified whether with hypoxia or hypercapnia: Secondary | ICD-10-CM

## 2016-03-29 DIAGNOSIS — E1122 Type 2 diabetes mellitus with diabetic chronic kidney disease: Secondary | ICD-10-CM | POA: Diagnosis present

## 2016-03-29 DIAGNOSIS — K766 Portal hypertension: Principal | ICD-10-CM | POA: Diagnosis present

## 2016-03-29 DIAGNOSIS — D638 Anemia in other chronic diseases classified elsewhere: Secondary | ICD-10-CM | POA: Diagnosis not present

## 2016-03-29 DIAGNOSIS — Z89412 Acquired absence of left great toe: Secondary | ICD-10-CM

## 2016-03-29 DIAGNOSIS — K3189 Other diseases of stomach and duodenum: Secondary | ICD-10-CM | POA: Diagnosis present

## 2016-03-29 DIAGNOSIS — K920 Hematemesis: Secondary | ICD-10-CM | POA: Diagnosis present

## 2016-03-29 DIAGNOSIS — Z01818 Encounter for other preprocedural examination: Secondary | ICD-10-CM

## 2016-03-29 DIAGNOSIS — Z79899 Other long term (current) drug therapy: Secondary | ICD-10-CM

## 2016-03-29 DIAGNOSIS — E7800 Pure hypercholesterolemia, unspecified: Secondary | ICD-10-CM | POA: Diagnosis present

## 2016-03-29 DIAGNOSIS — I8501 Esophageal varices with bleeding: Secondary | ICD-10-CM

## 2016-03-29 DIAGNOSIS — D62 Acute posthemorrhagic anemia: Secondary | ICD-10-CM | POA: Diagnosis present

## 2016-03-29 DIAGNOSIS — E1101 Type 2 diabetes mellitus with hyperosmolarity with coma: Secondary | ICD-10-CM | POA: Diagnosis not present

## 2016-03-29 DIAGNOSIS — E1165 Type 2 diabetes mellitus with hyperglycemia: Secondary | ICD-10-CM | POA: Diagnosis present

## 2016-03-29 DIAGNOSIS — E876 Hypokalemia: Secondary | ICD-10-CM | POA: Diagnosis present

## 2016-03-29 DIAGNOSIS — Z87891 Personal history of nicotine dependence: Secondary | ICD-10-CM

## 2016-03-29 DIAGNOSIS — I129 Hypertensive chronic kidney disease with stage 1 through stage 4 chronic kidney disease, or unspecified chronic kidney disease: Secondary | ICD-10-CM | POA: Diagnosis present

## 2016-03-29 DIAGNOSIS — N184 Chronic kidney disease, stage 4 (severe): Secondary | ICD-10-CM | POA: Diagnosis present

## 2016-03-29 DIAGNOSIS — E11649 Type 2 diabetes mellitus with hypoglycemia without coma: Secondary | ICD-10-CM | POA: Diagnosis not present

## 2016-03-29 DIAGNOSIS — K922 Gastrointestinal hemorrhage, unspecified: Secondary | ICD-10-CM | POA: Diagnosis not present

## 2016-03-29 DIAGNOSIS — Z794 Long term (current) use of insulin: Secondary | ICD-10-CM | POA: Diagnosis not present

## 2016-03-29 DIAGNOSIS — E162 Hypoglycemia, unspecified: Secondary | ICD-10-CM | POA: Diagnosis not present

## 2016-03-29 DIAGNOSIS — G9340 Encephalopathy, unspecified: Secondary | ICD-10-CM | POA: Diagnosis present

## 2016-03-29 DIAGNOSIS — D696 Thrombocytopenia, unspecified: Secondary | ICD-10-CM | POA: Diagnosis present

## 2016-03-29 DIAGNOSIS — I864 Gastric varices: Secondary | ICD-10-CM | POA: Diagnosis present

## 2016-03-29 DIAGNOSIS — E785 Hyperlipidemia, unspecified: Secondary | ICD-10-CM | POA: Diagnosis not present

## 2016-03-29 DIAGNOSIS — J810 Acute pulmonary edema: Secondary | ICD-10-CM | POA: Diagnosis present

## 2016-03-29 DIAGNOSIS — I959 Hypotension, unspecified: Secondary | ICD-10-CM | POA: Diagnosis present

## 2016-03-29 DIAGNOSIS — I8511 Secondary esophageal varices with bleeding: Secondary | ICD-10-CM | POA: Diagnosis present

## 2016-03-29 DIAGNOSIS — K7031 Alcoholic cirrhosis of liver with ascites: Secondary | ICD-10-CM | POA: Diagnosis present

## 2016-03-29 DIAGNOSIS — I8510 Secondary esophageal varices without bleeding: Secondary | ICD-10-CM | POA: Diagnosis present

## 2016-03-29 DIAGNOSIS — IMO0002 Reserved for concepts with insufficient information to code with codable children: Secondary | ICD-10-CM | POA: Diagnosis present

## 2016-03-29 DIAGNOSIS — K219 Gastro-esophageal reflux disease without esophagitis: Secondary | ICD-10-CM | POA: Diagnosis not present

## 2016-03-29 DIAGNOSIS — K7460 Unspecified cirrhosis of liver: Secondary | ICD-10-CM | POA: Diagnosis present

## 2016-03-29 DIAGNOSIS — R188 Other ascites: Secondary | ICD-10-CM

## 2016-03-29 DIAGNOSIS — J449 Chronic obstructive pulmonary disease, unspecified: Secondary | ICD-10-CM | POA: Diagnosis not present

## 2016-03-29 DIAGNOSIS — N179 Acute kidney failure, unspecified: Secondary | ICD-10-CM | POA: Diagnosis present

## 2016-03-29 DIAGNOSIS — M795 Residual foreign body in soft tissue: Secondary | ICD-10-CM

## 2016-03-29 LAB — I-STAT CHEM 8, ED
BUN: 35 mg/dL — AB (ref 6–20)
CALCIUM ION: 1.15 mmol/L (ref 1.12–1.23)
CREATININE: 3.3 mg/dL — AB (ref 0.61–1.24)
Chloride: 99 mmol/L — ABNORMAL LOW (ref 101–111)
GLUCOSE: 163 mg/dL — AB (ref 65–99)
HEMATOCRIT: 30 % — AB (ref 39.0–52.0)
HEMOGLOBIN: 10.2 g/dL — AB (ref 13.0–17.0)
Potassium: 3.2 mmol/L — ABNORMAL LOW (ref 3.5–5.1)
Sodium: 139 mmol/L (ref 135–145)
TCO2: 25 mmol/L (ref 0–100)

## 2016-03-29 LAB — COMPREHENSIVE METABOLIC PANEL
ALT: 24 U/L (ref 17–63)
AST: 35 U/L (ref 15–41)
Albumin: 2.8 g/dL — ABNORMAL LOW (ref 3.5–5.0)
Alkaline Phosphatase: 144 U/L — ABNORMAL HIGH (ref 38–126)
Anion gap: 9 (ref 5–15)
BILIRUBIN TOTAL: 1.5 mg/dL — AB (ref 0.3–1.2)
BUN: 40 mg/dL — ABNORMAL HIGH (ref 6–20)
CALCIUM: 8.3 mg/dL — AB (ref 8.9–10.3)
CO2: 24 mmol/L (ref 22–32)
CREATININE: 3.25 mg/dL — AB (ref 0.61–1.24)
Chloride: 102 mmol/L (ref 101–111)
GFR, EST AFRICAN AMERICAN: 21 mL/min — AB (ref >60)
GFR, EST NON AFRICAN AMERICAN: 18 mL/min — AB (ref >60)
Glucose, Bld: 167 mg/dL — ABNORMAL HIGH (ref 65–99)
Potassium: 3.1 mmol/L — ABNORMAL LOW (ref 3.5–5.1)
Sodium: 135 mmol/L (ref 135–145)
Total Protein: 6.3 g/dL — ABNORMAL LOW (ref 6.5–8.1)

## 2016-03-29 LAB — POC OCCULT BLOOD, ED: FECAL OCCULT BLD: NEGATIVE

## 2016-03-29 LAB — CBC WITH DIFFERENTIAL/PLATELET
BASOS PCT: 0 %
Basophils Absolute: 0 10*3/uL (ref 0.0–0.1)
EOS ABS: 0.1 10*3/uL (ref 0.0–0.7)
Eosinophils Relative: 1 %
HCT: 27.9 % — ABNORMAL LOW (ref 39.0–52.0)
HEMOGLOBIN: 9.6 g/dL — AB (ref 13.0–17.0)
Lymphocytes Relative: 20 %
Lymphs Abs: 0.8 10*3/uL (ref 0.7–4.0)
MCH: 31.1 pg (ref 26.0–34.0)
MCHC: 34.4 g/dL (ref 30.0–36.0)
MCV: 90.3 fL (ref 78.0–100.0)
Monocytes Absolute: 0.4 10*3/uL (ref 0.1–1.0)
Monocytes Relative: 11 %
NEUTROS PCT: 67 %
Neutro Abs: 2.5 10*3/uL (ref 1.7–7.7)
PLATELETS: 58 10*3/uL — AB (ref 150–400)
RBC: 3.09 MIL/uL — AB (ref 4.22–5.81)
RDW: 14.6 % (ref 11.5–15.5)
WBC: 3.7 10*3/uL — AB (ref 4.0–10.5)

## 2016-03-29 LAB — TYPE AND SCREEN
ABO/RH(D): A NEG
ANTIBODY SCREEN: NEGATIVE

## 2016-03-29 LAB — PROTIME-INR
INR: 1.32 (ref 0.00–1.49)
Prothrombin Time: 16.5 seconds — ABNORMAL HIGH (ref 11.6–15.2)

## 2016-03-29 LAB — CULTURE, BODY FLUID-BOTTLE

## 2016-03-29 LAB — CULTURE, BODY FLUID W GRAM STAIN -BOTTLE: Culture: NO GROWTH

## 2016-03-29 LAB — APTT: aPTT: 39 seconds — ABNORMAL HIGH (ref 24–37)

## 2016-03-29 LAB — TROPONIN I: Troponin I: <0.03 ng/mL (ref <0.03)

## 2016-03-29 LAB — AMMONIA: Ammonia: 29 umol/L (ref 9–35)

## 2016-03-29 LAB — I-STAT CG4 LACTIC ACID, ED: LACTIC ACID, VENOUS: 1.69 mmol/L (ref 0.5–1.9)

## 2016-03-29 MED ORDER — OCTREOTIDE ACETATE 500 MCG/ML IJ SOLN
50.0000 ug/h | INTRAMUSCULAR | Status: DC
  Administered 2016-03-29 – 2016-04-02 (×9): 50 ug/h via INTRAVENOUS
  Filled 2016-03-29 (×19): qty 1

## 2016-03-29 MED ORDER — SODIUM CHLORIDE 0.9 % IV BOLUS (SEPSIS)
1000.0000 mL | Freq: Once | INTRAVENOUS | Status: AC
  Administered 2016-03-29: 1000 mL via INTRAVENOUS

## 2016-03-29 MED ORDER — PANTOPRAZOLE SODIUM 40 MG IV SOLR
INTRAVENOUS | Status: AC
  Filled 2016-03-29: qty 160

## 2016-03-29 MED ORDER — DEXTROSE 5 % IV SOLN
1.0000 g | Freq: Once | INTRAVENOUS | Status: AC
  Administered 2016-03-29: 1 g via INTRAVENOUS
  Filled 2016-03-29: qty 10

## 2016-03-29 MED ORDER — PANTOPRAZOLE SODIUM 40 MG IV SOLR
8.0000 mg/h | INTRAVENOUS | Status: DC
  Filled 2016-03-29 (×2): qty 80

## 2016-03-29 MED ORDER — OCTREOTIDE ACETATE 500 MCG/ML IJ SOLN
INTRAMUSCULAR | Status: AC
  Filled 2016-03-29: qty 1

## 2016-03-29 MED ORDER — ONDANSETRON HCL 4 MG/2ML IJ SOLN
4.0000 mg | Freq: Once | INTRAMUSCULAR | Status: AC
  Administered 2016-03-29: 4 mg via INTRAVENOUS
  Filled 2016-03-29: qty 2

## 2016-03-29 MED ORDER — SODIUM CHLORIDE 0.9 % IV SOLN
INTRAVENOUS | Status: DC
  Administered 2016-03-29: 23:00:00 via INTRAVENOUS

## 2016-03-29 MED ORDER — SODIUM CHLORIDE 0.9 % IV SOLN
80.0000 mg | Freq: Once | INTRAVENOUS | Status: AC
  Administered 2016-03-29: 80 mg via INTRAVENOUS
  Filled 2016-03-29: qty 80

## 2016-03-29 MED ORDER — SODIUM CHLORIDE 0.9 % IV SOLN
8.0000 mg/h | INTRAVENOUS | Status: DC
  Administered 2016-03-29 – 2016-04-02 (×7): 8 mg/h via INTRAVENOUS
  Filled 2016-03-29 (×18): qty 80

## 2016-03-29 MED ORDER — OCTREOTIDE LOAD VIA INFUSION
50.0000 ug | Freq: Once | INTRAVENOUS | Status: AC
  Administered 2016-03-29: 50 ug via INTRAVENOUS
  Filled 2016-03-29: qty 25
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
## 2016-03-29 NOTE — ED Notes (Signed)
Unable to obtain oral temp at this time due to vomiting. Will attempt later.

## 2016-03-30 ENCOUNTER — Encounter (HOSPITAL_COMMUNITY): Payer: Self-pay | Admitting: *Deleted

## 2016-03-30 ENCOUNTER — Inpatient Hospital Stay (HOSPITAL_COMMUNITY): Payer: Medicare Other | Admitting: Certified Registered Nurse Anesthetist

## 2016-03-30 ENCOUNTER — Encounter (HOSPITAL_COMMUNITY)
Admission: EM | Disposition: A | Discharge status: Home Health | Payer: Self-pay | Source: Home / Self Care | Attending: Pulmonary Disease

## 2016-03-30 ENCOUNTER — Inpatient Hospital Stay (HOSPITAL_COMMUNITY): Payer: Medicare Other

## 2016-03-30 DIAGNOSIS — I8501 Esophageal varices with bleeding: Secondary | ICD-10-CM

## 2016-03-30 DIAGNOSIS — K922 Gastrointestinal hemorrhage, unspecified: Secondary | ICD-10-CM

## 2016-03-30 DIAGNOSIS — I8510 Secondary esophageal varices without bleeding: Secondary | ICD-10-CM

## 2016-03-30 DIAGNOSIS — E1101 Type 2 diabetes mellitus with hyperosmolarity with coma: Secondary | ICD-10-CM

## 2016-03-30 DIAGNOSIS — K7460 Unspecified cirrhosis of liver: Secondary | ICD-10-CM

## 2016-03-30 DIAGNOSIS — K3189 Other diseases of stomach and duodenum: Secondary | ICD-10-CM

## 2016-03-30 HISTORY — PX: ESOPHAGEAL BANDING: SHX5518

## 2016-03-30 HISTORY — PX: ESOPHAGOGASTRODUODENOSCOPY: SHX5428

## 2016-03-30 LAB — BASIC METABOLIC PANEL
ANION GAP: 8 (ref 5–15)
BUN: 33 mg/dL — AB (ref 6–20)
CHLORIDE: 107 mmol/L (ref 101–111)
CO2: 23 mmol/L (ref 22–32)
Calcium: 8.1 mg/dL — ABNORMAL LOW (ref 8.9–10.3)
Creatinine, Ser: 3.15 mg/dL — ABNORMAL HIGH (ref 0.61–1.24)
GFR calc Af Amer: 22 mL/min — ABNORMAL LOW (ref >60)
GFR, EST NON AFRICAN AMERICAN: 19 mL/min — AB (ref >60)
Glucose, Bld: 81 mg/dL (ref 65–99)
POTASSIUM: 3.6 mmol/L (ref 3.5–5.1)
SODIUM: 138 mmol/L (ref 135–145)

## 2016-03-30 LAB — TSH: TSH: 0.725 u[IU]/mL (ref 0.350–4.500)

## 2016-03-30 LAB — PROTIME-INR
INR: 1.4 (ref 0.00–1.49)
Prothrombin Time: 17.3 seconds — ABNORMAL HIGH (ref 11.6–15.2)

## 2016-03-30 LAB — CBC
HEMATOCRIT: 27.1 % — AB (ref 39.0–52.0)
HEMATOCRIT: 27.2 % — AB (ref 39.0–52.0)
HEMOGLOBIN: 9.1 g/dL — AB (ref 13.0–17.0)
HEMOGLOBIN: 9.2 g/dL — AB (ref 13.0–17.0)
MCH: 30.4 pg (ref 26.0–34.0)
MCH: 30.9 pg (ref 26.0–34.0)
MCHC: 33.6 g/dL (ref 30.0–36.0)
MCHC: 33.8 g/dL (ref 30.0–36.0)
MCV: 90.6 fL (ref 78.0–100.0)
MCV: 91.3 fL (ref 78.0–100.0)
Platelets: 75 10*3/uL — ABNORMAL LOW (ref 150–400)
Platelets: 73 10*3/uL — ABNORMAL LOW (ref 150–400)
RBC: 2.99 MIL/uL — ABNORMAL LOW (ref 4.22–5.81)
RBC: 2.98 MIL/uL — ABNORMAL LOW (ref 4.22–5.81)
RDW: 14.7 % (ref 11.5–15.5)
RDW: 14.9 % (ref 11.5–15.5)
WBC: 5 10*3/uL (ref 4.0–10.5)
WBC: 5.1 10*3/uL (ref 4.0–10.5)

## 2016-03-30 LAB — BLOOD GAS, ARTERIAL
Acid-base deficit: 0.6 mmol/L (ref 0.0–2.0)
Bicarbonate: 23.9 mEq/L (ref 20.0–24.0)
Drawn by: 33176
FIO2: 0.6
MECHVT: 560 mL
O2 Saturation: 99.6 %
PEEP: 5 cmH2O
Patient temperature: 97.4
RATE: 16 resp/min
TCO2: 25.1 mmol/L (ref 0–100)
pCO2 arterial: 40 mmHg (ref 35.0–45.0)
pH, Arterial: 7.389 (ref 7.350–7.450)
pO2, Arterial: 214 mmHg — ABNORMAL HIGH (ref 80.0–100.0)

## 2016-03-30 LAB — HEMOGLOBIN AND HEMATOCRIT, BLOOD
HCT: 27.4 % — ABNORMAL LOW (ref 39.0–52.0)
HEMATOCRIT: 27.4 % — AB (ref 39.0–52.0)
HEMATOCRIT: 24.8 % — AB (ref 39.0–52.0)
HEMOGLOBIN: 8.3 g/dL — AB (ref 13.0–17.0)
Hemoglobin: 9.3 g/dL — ABNORMAL LOW (ref 13.0–17.0)
Hemoglobin: 9.3 g/dL — ABNORMAL LOW (ref 13.0–17.0)

## 2016-03-30 LAB — COMPREHENSIVE METABOLIC PANEL
ALBUMIN: 2.5 g/dL — AB (ref 3.5–5.0)
ALK PHOS: 125 U/L (ref 38–126)
ALT: 22 U/L (ref 17–63)
ANION GAP: 19 — AB (ref 5–15)
AST: 34 U/L (ref 15–41)
BILIRUBIN TOTAL: 1.4 mg/dL — AB (ref 0.3–1.2)
BUN: 34 mg/dL — AB (ref 6–20)
CALCIUM: 9.7 mg/dL (ref 8.9–10.3)
CO2: 27 mmol/L (ref 22–32)
Chloride: 97 mmol/L — ABNORMAL LOW (ref 101–111)
Creatinine, Ser: 3.33 mg/dL — ABNORMAL HIGH (ref 0.61–1.24)
GFR calc Af Amer: 21 mL/min — ABNORMAL LOW (ref >60)
GFR, EST NON AFRICAN AMERICAN: 18 mL/min — AB (ref >60)
GLUCOSE: 126 mg/dL — AB (ref 65–99)
Potassium: 4.3 mmol/L (ref 3.5–5.1)
Sodium: 143 mmol/L (ref 135–145)
TOTAL PROTEIN: 6.3 g/dL — AB (ref 6.5–8.1)

## 2016-03-30 LAB — GLUCOSE, CAPILLARY
GLUCOSE-CAPILLARY: 109 mg/dL — AB (ref 65–99)
GLUCOSE-CAPILLARY: 75 mg/dL (ref 65–99)
Glucose-Capillary: 165 mg/dL — ABNORMAL HIGH (ref 65–99)
Glucose-Capillary: 88 mg/dL (ref 65–99)

## 2016-03-30 LAB — TYPE AND SCREEN
ABO/RH(D): A NEG
Antibody Screen: NEGATIVE

## 2016-03-30 LAB — MRSA PCR SCREENING: MRSA BY PCR: NEGATIVE

## 2016-03-30 LAB — LACTIC ACID, PLASMA: LACTIC ACID, VENOUS: 1.9 mmol/L (ref 0.5–1.9)

## 2016-03-30 SURGERY — EGD (ESOPHAGOGASTRODUODENOSCOPY)
Anesthesia: General

## 2016-03-30 MED ORDER — INSULIN ASPART 100 UNIT/ML ~~LOC~~ SOLN: 0.0000 [IU] | Freq: Three times a day (TID) | SUBCUTANEOUS | Status: DC

## 2016-03-30 MED ORDER — FENTANYL CITRATE (PF) 100 MCG/2ML IJ SOLN: 50.0000 ug | Freq: Once | INTRAMUSCULAR | Status: DC

## 2016-03-30 MED ORDER — DEXTROSE 5 % IV SOLN
1.0000 g | INTRAVENOUS | Status: AC
  Administered 2016-03-30: 1 g via INTRAVENOUS
  Filled 2016-03-30: qty 10

## 2016-03-30 MED ORDER — INSULIN ASPART 100 UNIT/ML ~~LOC~~ SOLN
0.0000 [IU] | SUBCUTANEOUS | Status: DC
  Administered 2016-04-01 – 2016-04-02 (×7): 2 [IU] via SUBCUTANEOUS
  Administered 2016-04-03 (×2): 3 [IU] via SUBCUTANEOUS
  Administered 2016-04-03 (×3): 2 [IU] via SUBCUTANEOUS

## 2016-03-30 MED ORDER — ONDANSETRON HCL 4 MG/2ML IJ SOLN
INTRAMUSCULAR | Status: DC | PRN
  Administered 2016-03-30: 4 mg via INTRAVENOUS

## 2016-03-30 MED ORDER — SODIUM CHLORIDE 0.9 % IV SOLN
25.0000 ug/h | INTRAVENOUS | Status: DC
  Administered 2016-03-30: 300 ug/h via INTRAVENOUS
  Administered 2016-03-30: 200 ug/h via INTRAVENOUS
  Administered 2016-03-31: 150 ug/h via INTRAVENOUS
  Administered 2016-04-01: 200 ug/h via INTRAVENOUS
  Filled 2016-03-30 (×5): qty 50

## 2016-03-30 MED ORDER — ALBUTEROL SULFATE (2.5 MG/3ML) 0.083% IN NEBU: 3.0000 mL | INHALATION_SOLUTION | Freq: Four times a day (QID) | RESPIRATORY_TRACT | Status: DC | PRN

## 2016-03-30 MED ORDER — DEXTROSE 5 % IV SOLN
2.0000 g | INTRAVENOUS | Status: DC
  Administered 2016-03-30 – 2016-04-03 (×5): 2 g via INTRAVENOUS
  Filled 2016-03-30 (×7): qty 2

## 2016-03-30 MED ORDER — SUCCINYLCHOLINE CHLORIDE 20 MG/ML IJ SOLN
INTRAMUSCULAR | Status: DC | PRN
  Administered 2016-03-30: 120 mg via INTRAVENOUS

## 2016-03-30 MED ORDER — ANTISEPTIC ORAL RINSE SOLUTION (CORINZ)
7.0000 mL | OROMUCOSAL | Status: DC
  Administered 2016-03-31 – 2016-04-01 (×15): 7 mL via OROMUCOSAL

## 2016-03-30 MED ORDER — FENTANYL CITRATE (PF) 250 MCG/5ML IJ SOLN
INTRAMUSCULAR | Status: DC | PRN
  Administered 2016-03-30: 50 ug via INTRAVENOUS

## 2016-03-30 MED ORDER — LEVOTHYROXINE SODIUM 100 MCG IV SOLR
75.0000 ug | Freq: Every day | INTRAVENOUS | Status: DC
  Administered 2016-03-30 – 2016-04-02 (×4): 75 ug via INTRAVENOUS
  Filled 2016-03-30 (×4): qty 5

## 2016-03-30 MED ORDER — ATORVASTATIN CALCIUM 40 MG PO TABS
40.0000 mg | ORAL_TABLET | Freq: Every day | ORAL | Status: DC
  Administered 2016-04-02 – 2016-04-03 (×2): 40 mg via ORAL
  Filled 2016-03-30 (×5): qty 1

## 2016-03-30 MED ORDER — ONDANSETRON HCL 4 MG/2ML IJ SOLN
4.0000 mg | Freq: Four times a day (QID) | INTRAMUSCULAR | Status: DC | PRN
  Administered 2016-04-01: 4 mg via INTRAVENOUS
  Filled 2016-03-30: qty 2

## 2016-03-30 MED ORDER — PHENYLEPHRINE HCL 10 MG/ML IJ SOLN
INTRAMUSCULAR | Status: DC | PRN
  Administered 2016-03-30 (×3): 80 ug via INTRAVENOUS

## 2016-03-30 MED ORDER — LIDOCAINE HCL (CARDIAC) 20 MG/ML IV SOLN
INTRAVENOUS | Status: DC | PRN
  Administered 2016-03-30: 60 mg via INTRAVENOUS

## 2016-03-30 MED ORDER — POTASSIUM CHLORIDE 10 MEQ/100ML IV SOLN
10.0000 meq | INTRAVENOUS | Status: AC
  Administered 2016-03-30 (×2): 10 meq via INTRAVENOUS
  Filled 2016-03-30 (×2): qty 100

## 2016-03-30 MED ORDER — MIDAZOLAM HCL 2 MG/2ML IJ SOLN
1.0000 mg | INTRAMUSCULAR | Status: DC | PRN
  Administered 2016-03-30 – 2016-04-01 (×3): 1 mg via INTRAVENOUS
  Filled 2016-03-30 (×2): qty 2

## 2016-03-30 MED ORDER — SIMVASTATIN 80 MG PO TABS: 80.0000 mg | ORAL_TABLET | Freq: Every day | ORAL | Status: DC

## 2016-03-30 MED ORDER — SODIUM CHLORIDE 0.9 % IV SOLN
INTRAVENOUS | Status: DC
  Administered 2016-03-30 – 2016-03-31 (×5): via INTRAVENOUS

## 2016-03-30 MED ORDER — ONDANSETRON HCL 4 MG PO TABS: 4.0000 mg | ORAL_TABLET | Freq: Four times a day (QID) | ORAL | Status: DC | PRN

## 2016-03-30 MED ORDER — FENTANYL BOLUS VIA INFUSION
25.0000 ug | INTRAVENOUS | Status: DC | PRN
  Administered 2016-03-30 – 2016-04-01 (×4): 25 ug via INTRAVENOUS
  Filled 2016-03-30: qty 25

## 2016-03-30 MED ORDER — INSULIN GLARGINE 100 UNIT/ML ~~LOC~~ SOLN
5.0000 [IU] | Freq: Every day | SUBCUTANEOUS | Status: DC
Start: 2016-03-30 — End: 2016-03-30
  Administered 2016-03-30: 5 [IU] via SUBCUTANEOUS
  Filled 2016-03-30 (×2): qty 0.05

## 2016-03-30 MED ORDER — PROPOFOL 500 MG/50ML IV EMUL
INTRAVENOUS | Status: DC | PRN
  Administered 2016-03-30: 5 ug/kg/min via INTRAVENOUS

## 2016-03-30 MED ORDER — MIDAZOLAM HCL 2 MG/2ML IJ SOLN
1.0000 mg | INTRAMUSCULAR | Status: DC | PRN
  Administered 2016-03-30 (×2): 1 mg via INTRAVENOUS
  Filled 2016-03-30 (×3): qty 2

## 2016-03-30 MED ORDER — CHLORHEXIDINE GLUCONATE 0.12% ORAL RINSE (MEDLINE KIT)
15.0000 mL | Freq: Two times a day (BID) | OROMUCOSAL | Status: DC
  Administered 2016-03-30 – 2016-04-04 (×9): 15 mL via OROMUCOSAL
  Filled 2016-03-30: qty 15

## 2016-03-30 MED ORDER — PROPOFOL 10 MG/ML IV BOLUS
INTRAVENOUS | Status: DC | PRN
  Administered 2016-03-30: 130 mg via INTRAVENOUS

## 2016-03-30 MED ORDER — ANTISEPTIC ORAL RINSE SOLUTION (CORINZ)
7.0000 mL | Freq: Four times a day (QID) | OROMUCOSAL | Status: DC
  Administered 2016-03-30: 7 mL via OROMUCOSAL

## 2016-03-30 NOTE — Progress Notes (Signed)
Pt intubated and sedated. Pt hadn't urinated since beginning of shift. Bladder revealed >999. Orders for foley insertion given.
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
## 2016-03-30 NOTE — Transfer of Care (Signed)
Immediate Anesthesia Transfer of Care Note  Patient: [name] [name]  Procedure(s) Performed: Procedure(s): ESOPHAGOGASTRODUODENOSCOPY (EGD) (N/A) ESOPHAGEAL BANDING  Patient Location: ICU  Anesthesia Type:General  Level of Consciousness: Patient remains intubated per anesthesia plan  Airway & Oxygen Therapy: Patient remains intubated per anesthesia plan and Patient placed on Ventilator (see vital sign flow sheet for setting)  Post-op Assessment: Report given to RN and Post -op Vital signs reviewed and stable  Post vital signs: Reviewed and stable  Last Vitals:  Filed Vitals:   03/30/16 1235 03/30/16 1501  BP: 150/78 125/77  Pulse: 80 63  Temp:    Resp: 11 16    Last Pain:  Filed Vitals:   03/30/16 1511  PainSc: 5       Patients Stated Pain Goal: 5 (03/30/16 1235)  Complications: No apparent anesthesia complications
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
## 2016-03-30 NOTE — Anesthesia Procedure Notes (Signed)
Procedure Name: Intubation Date/Time: 03/30/2016 1:13 PM Performed by: Reine JustFLOWERS, Aadhira Heffernan T Pre-anesthesia Checklist: Patient identified, Emergency Drugs available, Suction available, Patient being monitored and Timeout performed Patient Re-evaluated:Patient Re-evaluated prior to inductionOxygen Delivery Method: Circle system utilized and Simple face mask Preoxygenation: Pre-oxygenation with 100% oxygen Intubation Type: IV induction Laryngoscope Size: Miller and 3 Grade View: Grade I Tube type: Oral Tube size: 7.5 mm Number of attempts: 1 Airway Equipment and Method: Patient positioned with wedge pillow and Stylet Placement Confirmation: ETT inserted through vocal cords under direct vision,  positive ETCO2 and breath sounds checked- equal and bilateral Secured at: 21 cm Tube secured with: Tape Dental Injury: Teeth and Oropharynx as per pre-operative assessment

## 2016-03-30 NOTE — Progress Notes (Signed)
Pharmacy Antibiotic Note  [name] [name] is a 65 y.o. male admitted on 03/29/2016 with hematemesis.  Pharmacy has been consulted for Rocephin dosing for SBP prophylaxis.  Rocephin 1gm IV given in ED ~2045  Plan: Give additional 1gm IV now to make first dose = 2gm total Rocephin 2gm IV q24h Will f/u micro data and pt's clinical condition  Height: 5\' 9"  (175.3 cm) Weight: 229 lb (103.874 kg) IBW/kg (Calculated) : 70.7  No data recorded.   Recent Labs Lab 03/29/16 1920 03/29/16 1948  WBC 3.7*  --   CREATININE 3.25* 3.30*  LATICACIDVEN  --  1.69    Estimated Creatinine Clearance: 25.8 mL/min (by C-G formula based on Cr of 3.3).    No Known Allergies  Antimicrobials this admission: 7/2 Rocephin >>   Thank you for allowing pharmacy to be a part of this patient's care.  Christoper Fabianaron Jamise Pentland, PharmD, BCPS Clinical pharmacist, pager [phone] 03/30/2016 12:07 AM

## 2016-03-30 NOTE — Progress Notes (Signed)
Patient intubated and sedated, mittens on. ETT in place, wife at the bedside. Would like to know when he will be extubated and be allowed to wake up. Have had to give versed PRN for help with sedation. Fluids infusing with no difficulty. Vital signs are stable, no evidence of pain, anxious when sedation is not adequate. Will continue to monitor.

## 2016-03-30 NOTE — Care Management Important Message (Signed)
Important Message  Patient Details  Name: Darl HouseholderRoy L Pugh MRN: [id number] Date of Birth: 11-18-1950   Medicare Important Message Given:  Yes    Kyla BalzarineShealy, Nicholous Girgenti Abena 03/30/2016, 10:57 AM

## 2016-03-30 NOTE — Progress Notes (Signed)
Pt to remain intubated following EGD with banding of multiple esophageal varices. Concern for bleeding with existing thrombocytopenia and friable varices. To ICU on Propofol for sedation.

## 2016-03-30 NOTE — Anesthesia Preprocedure Evaluation (Signed)
Anesthesia Evaluation  Patient identified by MRN, date of birth, ID band Patient awake    Reviewed: Allergy & Precautions, H&P , NPO status , Patient's Chart, lab work & pertinent test results  Airway Mallampati: I  TM Distance: >3 FB Neck ROM: Full    Dental no notable dental hx. (+) Edentulous Upper, Edentulous Lower, Dental Advisory Given   Pulmonary COPD,  COPD inhaler, former smoker,    Pulmonary exam normal breath sounds clear to auscultation       Cardiovascular hypertension,  Rhythm:Regular Rate:Normal     Neuro/Psych negative neurological ROS  negative psych ROS   GI/Hepatic GERD  Medicated and Controlled,(+) Cirrhosis   Esophageal Varices    ,   Endo/Other  diabetes, Type 1, Insulin DependentHypothyroidism   Renal/GU Renal InsufficiencyRenal disease  negative genitourinary   Musculoskeletal   Abdominal   Peds  Hematology negative hematology ROS (+)   Anesthesia Other Findings   Reproductive/Obstetrics negative OB ROS                             Anesthesia Physical Anesthesia Plan  ASA: III  Anesthesia Plan: General   Post-op Pain Management:    Induction: Intravenous, Rapid sequence and Cricoid pressure planned  Airway Management Planned: Oral ETT  Additional Equipment:   Intra-op Plan:   Post-operative Plan: Extubation in OR  Informed Consent: I have reviewed the patients History and Physical, chart, labs and discussed the procedure including the risks, benefits and alternatives for the proposed anesthesia with the patient or authorized representative who has indicated his/her understanding and acceptance.   Dental advisory given  Plan Discussed with: CRNA  Anesthesia Plan Comments:         Anesthesia Quick Evaluation

## 2016-03-30 NOTE — Op Note (Signed)
[facility] Patient Name: [name] [name] Procedure Date : 03/30/2016 MRN: [id number] Attending MD: Barrie Folk , MD Date of Birth: 07/05/1951 CSN: [id number] Age: 65 Admit Type: Inpatient Procedure:                Upper GI endoscopy Indications:              Hematemesis Providers:                Everardo All. Madilyn Fireman, MD, Kandice Robinsons, Technician,                            Tomma Rakers, RN Referring MD:              Medicines:                General Anesthesia Complications:            Moderate bleeding Estimated Blood Loss:     Estimated blood loss: 100 mL. Procedure:                Pre-Anesthesia Assessment:                           - Prior to the procedure, a History and Physical                            was performed, and patient medications and                            allergies were reviewed. The patient's tolerance of                            previous anesthesia was also reviewed. The risks                            and benefits of the procedure and the sedation                            options and risks were discussed with the patient.                            All questions were answered, and informed consent                            was obtained. Prior Anticoagulants: The patient has                            taken no previous anticoagulant or antiplatelet                            agents. ASA Grade Assessment: III - A patient with                            severe systemic disease. After reviewing the risks  and benefits, the patient was deemed in                            satisfactory condition to undergo the procedure.                           After obtaining informed consent, the endoscope was                            passed under direct vision. Throughout the                            procedure, the patient's blood pressure, pulse, and                            oxygen saturations were monitored continuously. The                             [id number] ([id number]) scope was introduced through the                            mouth, and advanced to the second part of duodenum.                            The upper GI endoscopy was accomplished without                            difficulty. The patient tolerated the procedure                            well. Scope In: Scope Out: Findings:      Non-bleeding grade II varices were found in the mid esophagus and in the       distal esophagus,. Red wale signs were present. Four bands were       successfully placed with incomplete eradication of varices. Steady       bleeding continued at the end of the procedure.      Patchy mild inflammation characterized by erosions and granularity was       found in the gastric antrum.      The examined duodenum was normal. Impression:               - Non-bleeding grade II esophageal varices.                            Incompletely eradicated. Banded.                           - Gastritis.                           - Normal examined duodenum.                           - No specimens collected. Moderate Sedation:      no moderate sedation Recommendation:           -  Continue present medications.                           - Repeat upper endoscopy PRN for retreatment.                           - Make the patient NPO [Start day]. Procedure Code(s):        --- Professional ---                           [phone], Esophagogastroduodenoscopy, flexible,                            transoral; with band ligation of esophageal/gastric                            varices Diagnosis Code(s):        --- Professional ---                           I85.00, Esophageal varices without bleeding                           K29.70, Gastritis, unspecified, without bleeding                           K92.0, Hematemesis CPT copyright 2016 American Medical Association. All rights reserved. The codes documented in this report are preliminary and upon coder  review may  be revised to meet current compliance requirements. Barrie FolkJohn C Tashika Goodin, MD 03/30/2016 1:56:29 PM This report has been signed electronically. Number of Addenda: 0

## 2016-03-30 NOTE — Progress Notes (Signed)
Gastroenterology follow-up: EGD done showing a single proximal varix with fresh clot overlying it. The bander was deployed and predictably when sucking up the varix that bled instantly. Despite holding the varix for prolonged suction and applying a band it continued to appear to bleed. I placed 2 more bands on varices distally at the GE junction and one slightly proximal to the bleeding varix. It was not certain and the procedure were rather to bleeding and stopped. It was elected to leave him intubated and transferred to the ICU for observation and ongoing medical care.

## 2016-03-31 ENCOUNTER — Inpatient Hospital Stay (HOSPITAL_COMMUNITY): Payer: Medicare Other

## 2016-03-31 DIAGNOSIS — J9601 Acute respiratory failure with hypoxia: Secondary | ICD-10-CM | POA: Insufficient documentation

## 2016-03-31 LAB — CBC
HEMATOCRIT: 23.9 % — AB (ref 39.0–52.0)
HEMATOCRIT: 30.1 % — AB (ref 39.0–52.0)
Hemoglobin: 8 g/dL — ABNORMAL LOW (ref 13.0–17.0)
Hemoglobin: 9.6 g/dL — ABNORMAL LOW (ref 13.0–17.0)
MCH: 30.5 pg (ref 26.0–34.0)
MCH: 29.7 pg (ref 26.0–34.0)
MCHC: 33.5 g/dL (ref 30.0–36.0)
MCHC: 31.9 g/dL (ref 30.0–36.0)
MCV: 91.2 fL (ref 78.0–100.0)
MCV: 93.2 fL (ref 78.0–100.0)
PLATELETS: 71 10*3/uL — AB (ref 150–400)
Platelets: 56 10*3/uL — ABNORMAL LOW (ref 150–400)
RBC: 2.62 MIL/uL — ABNORMAL LOW (ref 4.22–5.81)
RBC: 3.23 MIL/uL — ABNORMAL LOW (ref 4.22–5.81)
RDW: 14.9 % (ref 11.5–15.5)
RDW: 15.1 % (ref 11.5–15.5)
WBC: 3.2 10*3/uL — AB (ref 4.0–10.5)
WBC: 7.4 10*3/uL (ref 4.0–10.5)

## 2016-03-31 LAB — BASIC METABOLIC PANEL
ANION GAP: 6 (ref 5–15)
BUN: 34 mg/dL — AB (ref 6–20)
CALCIUM: 7.9 mg/dL — AB (ref 8.9–10.3)
CO2: 24 mmol/L (ref 22–32)
Chloride: 109 mmol/L (ref 101–111)
Creatinine, Ser: 3.19 mg/dL — ABNORMAL HIGH (ref 0.61–1.24)
GFR calc Af Amer: 22 mL/min — ABNORMAL LOW (ref >60)
GFR calc non Af Amer: 19 mL/min — ABNORMAL LOW (ref >60)
GLUCOSE: 73 mg/dL (ref 65–99)
Potassium: 3.6 mmol/L (ref 3.5–5.1)
Sodium: 139 mmol/L (ref 135–145)

## 2016-03-31 LAB — GLUCOSE, CAPILLARY
GLUCOSE-CAPILLARY: 81 mg/dL (ref 65–99)
Glucose-Capillary: 100 mg/dL — ABNORMAL HIGH (ref 65–99)
Glucose-Capillary: 107 mg/dL — ABNORMAL HIGH (ref 65–99)
Glucose-Capillary: 111 mg/dL — ABNORMAL HIGH (ref 65–99)
Glucose-Capillary: 129 mg/dL — ABNORMAL HIGH (ref 65–99)
Glucose-Capillary: 68 mg/dL (ref 65–99)
Glucose-Capillary: 80 mg/dL (ref 65–99)
Glucose-Capillary: 82 mg/dL (ref 65–99)

## 2016-03-31 LAB — MAGNESIUM: Magnesium: 1.9 mg/dL (ref 1.7–2.4)

## 2016-03-31 LAB — HEMOGLOBIN A1C
Hgb A1c MFr Bld: 5.7 % — ABNORMAL HIGH (ref 4.8–5.6)
MEAN PLASMA GLUCOSE: 117 mg/dL

## 2016-03-31 LAB — PHOSPHORUS: Phosphorus: 4.4 mg/dL (ref 2.5–4.6)

## 2016-03-31 MED ORDER — DEXTROSE 10 % IV SOLN
INTRAVENOUS | Status: DC
  Administered 2016-03-31 – 2016-04-01 (×3): via INTRAVENOUS

## 2016-03-31 MED ORDER — DEXTROSE 50 % IV SOLN
INTRAVENOUS | Status: AC
  Administered 2016-03-31: 50 mL
  Filled 2016-03-31: qty 50

## 2016-03-31 NOTE — Progress Notes (Signed)
Hypoglycemic Event  CBG: 68  Treatment: D50 IV 50 mL  Symptoms: None  Follow-up CBG: NFAO:[id number]:0828 CBG Result:129  Possible Reasons for Event: Inadequate meal intake  Comments/MD notified: Rubye Oaksammy Parrett, NP notified   [name] [name],[name] [name]

## 2016-03-31 NOTE — Progress Notes (Signed)
Initial Nutrition Assessment  DOCUMENTATION CODES:   Obesity unspecified  INTERVENTION:    If TF started, initiate Vital High Protein at goal rate of 40 ml/h (960 ml per day) and Prostat 60 ml BID to provide 1360 kcals, 144 gm protein, 803 ml free water daily.  NUTRITION DIAGNOSIS:   Inadequate oral intake related to inability to eat as evidenced by NPO status  GOAL:   Provide needs based on ASPEN/SCCM guidelines  MONITOR:   Vent status, Labs, Weight trends, I & O's  REASON FOR ASSESSMENT:   Ventilator  ASSESSMENT:   65 y.o. Male with PMH as outlined below including cirrhosis, portal HTN, gastric and esophageal varices ([place]. [place] [facility] with 4 episodes of hematemesis and while in ED also had 1 large episode of hematochezia.   Patient s/p procedure 7/3: UPPER GI ENDOSCOPY   Patient is currently intubated on ventilator support Temp (24hrs), Avg:96.8 F (36 C), Min:94.5 F (34.7 C), Max:98.2 F (36.8 C)   Endoscopy showed non-bleeding grade II esophageal varices with gastritis. Had severe bleeding and thrombocytopenia >> transferred  into ICU. Per Malnutrition Screening Tool Report, pt eating poorly because of a decreased appetite. Nutrition focused physical exam completed.  No muscle or subcutaneous fat depletion noticed.  Diet Order:  Diet NPO time specified  Skin:  Reviewed, no issues  Last BM:  N/A  Height:   Ht Readings from Last 1 Encounters:  03/30/16 5\' 8"  (1.727 m)    Weight:   Wt Readings from Last 1 Encounters:  03/30/16 215 lb (97.523 kg)    Ideal Body Weight:  70 kg  BMI:  Body mass index is 32.7 kg/(m^2).  Estimated Nutritional Needs:   Kcal:  [id number]  Protein:  140-150 gm  Fluid:  per MD  EDUCATION NEEDS:   No education needs identified at this time  Maureen ChattersKatie Lashawndra Lampkins, RD, LDN Pager #: [phone] After-Hours Pager #: [phone]

## 2016-03-31 NOTE — Progress Notes (Signed)
Patient ID: [name] HouseholderRoy L Willhelm, male   DOB: 1951/08/25, 65 y.o.   MRN: [id number] [facility] Progress Note  [name] HouseholderRoy L Maden 65 y.o. 1951/08/25   Subjective: Sedated, intubated. No bleeding overnight.  Objective: Vital signs in last 24 hours: Filed Vitals:   03/31/16 1145 03/31/16 1200  BP: 125/79 118/81  Pulse: 86 86  Temp:  98.1 F (36.7 C)  Resp: 16 15    Physical Exam: Gen: sedated, intubated, elderly, thin CV: RRR Chest: coarse breath sounds Abd: soft, nontender (to facial grimace), +distention, +BS Ext: +pedal edema  Lab Results:  Recent Labs  03/30/16 2154 03/31/16 0304  NA 138 139  K 3.6 3.6  CL 107 109  CO2 23 24  GLUCOSE 81 73  BUN 33* 34*  CREATININE 3.15* 3.19*  CALCIUM 8.1* 7.9*  MG  --  1.9  PHOS  --  4.4    Recent Labs  03/29/16 1920 03/30/16 0444  AST 35 34  ALT 24 22  ALKPHOS 144* 125  BILITOT 1.5* 1.4*  PROT 6.3* 6.3*  ALBUMIN 2.8* 2.5*    Recent Labs  03/29/16 1920  03/30/16 2154 03/31/16 0304  WBC 3.7*  < > 5.1 3.2*  NEUTROABS 2.5  --   --   --   HGB 9.6*  < > 9.2* 8.0*  HCT 27.9*  < > 27.2* 23.9*  MCV 90.3  < > 91.3 91.2  PLT 58*  < > 73* 56*  < > = values in this interval not displayed.  Recent Labs  03/29/16 1920 03/30/16 2154  LABPROT 16.5* 17.3*  INR 1.32 1.40      Assessment/Plan: S/P variceal bleed with esophageal variceal band ligation yesterday without any signs of ongoing bleeding. Hgb 8.0. Continue Octreotide and Protonix drip. No plans to repeat EGD at this time. Wean ventilator per PCCM. Supportive care. [facility] will follow.   Jewell Ryans C. 03/31/2016, 2:45 PM  Pager [phone]  If no answer or after 5 PM call [phone]

## 2016-03-31 NOTE — Plan of Care (Signed)
Problem: Education: Goal: Knowledge of [facility] Education information/materials will improve Outcome: Progressing Pt temp was 94 degrees F rectal at 2000. Warming blanket applied. At 0600 warming blanket was removed. Pt remained intubated and had not urinated. Bladder scan was great 999 and foley was inserted. Pt otherwise remained stable overnight but is easily agitated and will attempt to removed ETT, requiring 2 PRN doses of Versed.  Pt has minimal secretion during suction of ETT.

## 2016-03-31 NOTE — Anesthesia Postprocedure Evaluation (Signed)
Anesthesia Post Note  Patient: [name] HouseholderRoy L Laseter  Procedure(s) Performed: Procedure(s) (LRB): ESOPHAGOGASTRODUODENOSCOPY (EGD) (N/A) ESOPHAGEAL BANDING  Patient location during evaluation: SICU Anesthesia Type: General Level of consciousness: sedated Pain management: pain level controlled Vital Signs Assessment: post-procedure vital signs reviewed and stable Respiratory status: patient remains intubated per anesthesia plan Cardiovascular status: stable Anesthetic complications: no    Last Vitals:  Filed Vitals:   03/31/16 1955 03/31/16 2000  BP: 116/71 112/71  Pulse: 83 79  Temp:  37.4 C  Resp: 17     Last Pain:  Filed Vitals:   03/31/16 2041  PainSc: 5                  Darrian Goodwill,W. EDMOND

## 2016-03-31 NOTE — Consult Note (Signed)
PULMONARY / CRITICAL CARE MEDICINE   Name: [name] HouseholderRoy L Diem MRN: [id number] DOB: 05-06-1951    ADMISSION DATE:  03/29/2016 CONSULTATION DATE:  078/02/17  REFERRING MD:  Madilyn FiremanHayes (GI)  CHIEF COMPLAINT:  Hematemesis  HISTORY OF PRESENT ILLNESS:  Pt is encephelopathic; therefore, this HPI is obtained from chart review. [name] HouseholderRoy L Bathe is a 65 y.o. male with PMH as outlined below including cirrhosis, portal HTN, gastric and esophageal varices (managed by Dr. [place]ena [place]ourk in [place]).  [facility] with 4 episodes of hematemesis and while in ED also had 1 large episode of hematochezia.  He apparently has had hematemesis before.  He had EGD in Oct 2016 which showed large esophageal varices with no active bleeding as well as portal gastropathy. He undergoes paracentesis every month, last one was June 26 when he had 7L fluid removed.  On 7/3, he was evaluated by Dr. Madilyn FiremanHayes of GI.  He was intubated electively and nderwent EGD where he had banding of varices x 5.  There was apparently steady bleeding even at the end of the procedure; therefore, pt was left intubated and transferred to the ICU for closer observation.  He will have repeat EGD as needed.   SUBJECTIVE: On vent, sedated. No bleeding noted overnight. Sedation being weaned off and pt was not awake enough to be extubated or to do PST today.   VITAL SIGNS: BP 117/75 mmHg  Pulse 77  Temp(Src) 99.1 F (37.3 C) (Core (Comment))  Resp 16  Ht 5\' 8"  (1.727 m)  Wt 97.523 kg (215 lb)  BMI 32.70 kg/m2  SpO2 100%  HEMODYNAMICS:    VENTILATOR SETTINGS: Vent Mode:  [-] PRVC FiO2 (%):  [40 %] 40 % Set Rate:  [16 bmp] 16 bmp Vt Set:  [560 mL] 560 mL PEEP:  [5 cmH20] 5 cmH20 Pressure Support:  [15 cmH20] 15 cmH20 Plateau Pressure:  [15 cmH20-24 cmH20] 19 cmH20  INTAKE / OUTPUT: I/O last 3 completed shifts: In: 7034.2 [I.V.:6884.2; IV Piggyback:150] Out: 1905 [Urine:805; Stool:900; Blood:200]   PHYSICAL EXAMINATION: General: Middle  aged male, appears older than stated age, in NAD. Neuro: Sedated, non-responsive. HEENT: [place]/AT. PERRL, sclerae anicteric. Cardiovascular: RRR, no M/R/G.  Lungs: Respirations even and unlabored.  CTA bilaterally, No W/R/R. Abdomen: Ascites.  Abd, soft, NT/ND.  Musculoskeletal: No gross deformities, no edema.  Skin: Intact, warm, no rashes.  LABS:  BMET  Recent Labs Lab 03/30/16 0444 03/30/16 2154 03/31/16 0304  NA 143 138 139  K 4.3 3.6 3.6  CL 97* 107 109  CO2 27 23 24   BUN 34* 33* 34*  CREATININE 3.33* 3.15* 3.19*  GLUCOSE 126* 81 73    Electrolytes  Recent Labs Lab 03/30/16 0444 03/30/16 2154 03/31/16 0304  CALCIUM 9.7 8.1* 7.9*  MG  --   --  1.9  PHOS  --   --  4.4    CBC  Recent Labs Lab 03/30/16 0444  03/30/16 1536 03/30/16 2154 03/31/16 0304  WBC 5.0  --   --  5.1 3.2*  HGB 9.1*  < > 8.3* 9.2* 8.0*  HCT 27.1*  < > 24.8* 27.2* 23.9*  PLT 75*  --   --  73* 56*  < > = values in this interval not displayed.  Coag's  Recent Labs Lab 03/29/16 1920 03/30/16 2154  APTT 39*  --   INR 1.32 1.40    Sepsis Markers  Recent Labs Lab 03/29/16 1948 03/30/16 1536  LATICACIDVEN 1.69 1.9  ABG  Recent Labs Lab 03/30/16 1705  PHART 7.389  PCO2ART 40.0  PO2ART 214*    Liver Enzymes  Recent Labs Lab 03/29/16 1920 03/30/16 0444  AST 35 34  ALT 24 22  ALKPHOS 144* 125  BILITOT 1.5* 1.4*  ALBUMIN 2.8* 2.5*    Cardiac Enzymes  Recent Labs Lab 03/29/16 1920  TROPONINI <0.03    Glucose  Recent Labs Lab 03/31/16 0013 03/31/16 0403 03/31/16 0754 03/31/16 0828 03/31/16 1155 03/31/16 1516  GLUCAP 80 82 68 129* 111* 100*    Imaging Portable Chest Xray  03/31/2016  CLINICAL DATA:  Respiratory failure EXAM: PORTABLE CHEST 1 VIEW COMPARISON:  03/30/2016 FINDINGS: Endotracheal tube is in stable position. Bilateral airspace opacities are again noted, most pronounced in the lung bases. Suspect small effusions. Heart is borderline in  size. IMPRESSION: Bilateral perihilar and lower lobe airspace opacities again noted, not significantly changed. Suspect layering effusions. Electronically Signed   By: Charlett NoseKevin  Dover M.D.   On: 03/31/2016 07:50     STUDIES:  AXR 7/2 > extensive ascites.  CXR 7/2 > no acute process. CXR 7/3 >  CULTURES: Blood 7/2 >  ANTIBIOTICS: Ceftriaxone 7/2 >  SIGNIFICANT EVENTS: 7/2 > admit. 7/3 > EGD with banding of varices x 5.  Remained intubated and transferred to ICU.  LINES/TUBES: ETT 7/3 >  DISCUSSION: 65 y.o. M with hx cirrhosis, portal HTN, esophageal varices.  Admitted 7/2 with hematemesis.  On 7/3, he was intubated and underwent EGD with banding of varices x 5.  He remained intubated post procedure and was transferred to the ICU for further evaluation and management.  ASSESSMENT / PLAN:  GASTROINTESTINAL A:   Esophageal varices - s/p banding x 5 on 03/30/16 (Dr. Madilyn FiremanHayes). Hx cirrhosis, portal HTN, ascites with monthly paracentesis. Nutrition. P:   GI following, appreciate the assistance. Continue PPI gtt, octreotide gtt. NPO.  HEMATOLOGIC A:   Acute blood loss anemia. 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 VTE Prophylaxis. P:  Cbc now Transfuse for Hgb < 7. SCD's only. CBC in AM.  PULMONARY A: Respiratory insufficiency - with inability to protect airway due to active hematemesis.  S/p intubation 7/3. Hx COPD without evidence of exacerbation. P:   Full vent support. Wean as able. > was not awake enough today. hopefully in am.  VAP prevention measures. Continue albuterol PRN.   CARDIOVASCULAR A:  Hypotension - likely sedation related, though must consider hemorrhagic from hematemesis. Improved.  Hx HTN, HLD. P:  D/c propofol. Monitor hemodynamics. Goal MAP > 65. Lactate reassuring Hold outpatient spironolactone, torsemide.  RENAL A:   AoCKD. P:   NS @ 50 BMP in AM.  INFECTIOUS A:   SBP prophylaxis. P:   Abx as  above (Ceftriaxone).   ENDOCRINE A:   DM.   Hypothyroidism. P:   SSI. Continue outpatient synthroid, change to IV formulation. Assess TSH.  NEUROLOGIC A:   Acute encephalopathy. P:   Sedation:  Fentanyl gtt / Midazolam PRN. RASS goal: 0 to -1. Daily WUA. Hold outpatient alprazolam, gabapentin, oxycodone, ropinirole, tizanidine.  Family updated: Updated sister in law at bedside.   Interdisciplinary Family Meeting v Palliative Care Meeting:  Due by: 07/10.  CC time: 35 minutes.  Pollie MeyerJ. Angelo A de Dios, MD 03/31/2016, 6:35 PM [facility] Pager [phone] After 3 pm or if no answer, call [phone]

## 2016-04-01 ENCOUNTER — Encounter (HOSPITAL_COMMUNITY): Payer: Self-pay | Admitting: Gastroenterology

## 2016-04-01 DIAGNOSIS — J810 Acute pulmonary edema: Secondary | ICD-10-CM | POA: Insufficient documentation

## 2016-04-01 LAB — BASIC METABOLIC PANEL
ANION GAP: 5 (ref 5–15)
Anion gap: 7 (ref 5–15)
BUN: 32 mg/dL — ABNORMAL HIGH (ref 6–20)
BUN: 32 mg/dL — ABNORMAL HIGH (ref 6–20)
CALCIUM: 8 mg/dL — AB (ref 8.9–10.3)
CALCIUM: 8.1 mg/dL — AB (ref 8.9–10.3)
CO2: 22 mmol/L (ref 22–32)
CO2: 20 mmol/L — ABNORMAL LOW (ref 22–32)
CREATININE: 3.14 mg/dL — AB (ref 0.61–1.24)
Chloride: 111 mmol/L (ref 101–111)
Chloride: 112 mmol/L — ABNORMAL HIGH (ref 101–111)
Creatinine, Ser: 3.17 mg/dL — ABNORMAL HIGH (ref 0.61–1.24)
GFR, EST AFRICAN AMERICAN: 22 mL/min — AB (ref >60)
GFR, EST AFRICAN AMERICAN: 22 mL/min — AB (ref >60)
GFR, EST NON AFRICAN AMERICAN: 19 mL/min — AB (ref >60)
GFR, EST NON AFRICAN AMERICAN: 19 mL/min — AB (ref >60)
Glucose, Bld: 108 mg/dL — ABNORMAL HIGH (ref 65–99)
Glucose, Bld: 124 mg/dL — ABNORMAL HIGH (ref 65–99)
Potassium: 3.7 mmol/L (ref 3.5–5.1)
Potassium: 3.7 mmol/L (ref 3.5–5.1)
SODIUM: 138 mmol/L (ref 135–145)
SODIUM: 139 mmol/L (ref 135–145)

## 2016-04-01 LAB — CBC
HEMATOCRIT: 28.7 % — AB (ref 39.0–52.0)
Hemoglobin: 9.3 g/dL — ABNORMAL LOW (ref 13.0–17.0)
MCH: 30.3 pg (ref 26.0–34.0)
MCHC: 32.4 g/dL (ref 30.0–36.0)
MCV: 93.5 fL (ref 78.0–100.0)
PLATELETS: 83 10*3/uL — AB (ref 150–400)
RBC: 3.07 MIL/uL — ABNORMAL LOW (ref 4.22–5.81)
RDW: 15 % (ref 11.5–15.5)
WBC: 8.4 10*3/uL (ref 4.0–10.5)

## 2016-04-01 LAB — GLUCOSE, CAPILLARY
GLUCOSE-CAPILLARY: 102 mg/dL — AB (ref 65–99)
GLUCOSE-CAPILLARY: 112 mg/dL — AB (ref 65–99)
GLUCOSE-CAPILLARY: 123 mg/dL — AB (ref 65–99)
Glucose-Capillary: 130 mg/dL — ABNORMAL HIGH (ref 65–99)
Glucose-Capillary: 138 mg/dL — ABNORMAL HIGH (ref 65–99)
Glucose-Capillary: 138 mg/dL — ABNORMAL HIGH (ref 65–99)

## 2016-04-01 LAB — HEMOGLOBIN A1C
HEMOGLOBIN A1C: 5.9 % — AB (ref 4.8–5.6)
Mean Plasma Glucose: 123 mg/dL

## 2016-04-01 MED ORDER — LACTULOSE 10 GM/15ML PO SOLN
20.0000 g | Freq: Three times a day (TID) | ORAL | Status: DC
  Administered 2016-04-01 – 2016-04-04 (×7): 20 g via ORAL
  Filled 2016-04-01 (×10): qty 30

## 2016-04-01 MED ORDER — BUMETANIDE 0.25 MG/ML IJ SOLN
1.0000 mg | Freq: Once | INTRAMUSCULAR | Status: AC
  Administered 2016-04-01: 1 mg via INTRAVENOUS
  Filled 2016-04-01: qty 4

## 2016-04-01 MED ORDER — CETYLPYRIDINIUM CHLORIDE 0.05 % MT LIQD
7.0000 mL | Freq: Two times a day (BID) | OROMUCOSAL | Status: DC
  Administered 2016-04-02 – 2016-04-04 (×4): 7 mL via OROMUCOSAL

## 2016-04-01 NOTE — Progress Notes (Signed)
175cc of fentanyl drip wasted in sink. Witnessed by Holland CommonsMaria RN.

## 2016-04-01 NOTE — Progress Notes (Signed)
Pt vomited pink-tinged emesis following PO intake x's 3 today. 1 dose of Zofran administered without effect. Remains on protonix and ocreotide. Pt made NPO. GI paged. No new orders at this time. VSS. Nursing to continue to monitor patient.

## 2016-04-01 NOTE — Progress Notes (Addendum)
Patient ID: [name] [name], male   DOB: 1950-10-30, 65 y.o.   MRN: [id number] [facility] Progress [facility]  [name] [name] 65 y.o. 1950-10-30   Subjective: Recently extubated. Awake. No bleeding overnight.  Objective: Vital signs in last 24 hours: Filed Vitals:   04/01/16 0845 04/01/16 0940  BP: 134/76 151/88  Pulse: 88 88  Temp:  97.9  Resp: 16 11    Physical Exam: Gen: lethargic, no acute distress, extubated HEENT: anicteric sclera CV: RRR Chest: Coarse breath sounds Abd: Distended, epigastric tenderness Ext: no edema  Lab Results:  Recent Labs  03/31/16 0304 04/01/16 0805  NA 139 138  K 3.6 3.7  CL 109 111  CO2 24 22  GLUCOSE 73 108*  BUN 34* 32*  CREATININE 3.19* 3.17*  CALCIUM 7.9* 8.0*  MG 1.9  --   PHOS 4.4  --     Recent Labs  03/29/16 1920 03/30/16 0444  AST 35 34  ALT 24 22  ALKPHOS 144* 125  BILITOT 1.5* 1.4*  PROT 6.3* 6.3*  ALBUMIN 2.8* 2.5*    Recent Labs  03/29/16 1920  03/31/16 1856 04/01/16 0805  WBC 3.7*  < > 7.4 8.4  NEUTROABS 2.5  --   --   --   HGB 9.6*  < > 9.6* 9.3*  HCT 27.9*  < > 30.1* 28.7*  MCV 90.3  < > 93.2 93.5  PLT 58*  < > 71* 83*  < > = values in this interval not displayed.  Recent Labs  03/29/16 1920 03/30/16 2154  LABPROT 16.5* 17.3*  INR 1.32 1.40      Assessment/Plan: 65 yo with decompensated cirrhosis with recent variceal bleed - s/p EVBL (band ligation) on 03/30/16 without any further bleeding. Hgb stable at 9.3. Continue Octreotide drip. Continue Protonix drip. Ok to start POs today from GI standpoint but defer timing to CCM since recently extubated. Continue IV Abx due to GI bleed in setting of cirrhosis. [facility] will follow up tomorrow.   Franco Duley C. 04/01/2016, 9:59 AM  Pager [phone]  If no answer or after 5 PM call [phone]

## 2016-04-01 NOTE — Consult Note (Signed)
PULMONARY / CRITICAL CARE MEDICINE   Name: [name] [name] MRN: [id number] DOB: 03-07-1951    ADMISSION DATE:  03/29/2016 CONSULTATION DATE:  078/02/17  REFERRING MD:  Madilyn Fireman (GI)  CHIEF COMPLAINT:  Hematemesis  HISTORY OF PRESENT ILLNESS:  Pt is encephelopathic; therefore, this HPI is obtained from chart review. [name] [name] is a 65 y.o. male with PMH as outlined below including cirrhosis, portal HTN, gastric and esophageal varices (managed by Dr. Jena Gauss in [place]).  He presented to [facility] ED 07/02 with 4 episodes of hematemesis and while in ED also had 1 large episode of hematochezia.  He apparently has had hematemesis before.  He had EGD in Oct 2016 which showed large esophageal varices with no active bleeding as well as portal gastropathy. He undergoes paracentesis every month, last one was June 26 when he had 7L fluid removed.  On 7/3, he was evaluated by Dr. Madilyn Fireman of GI.  He was intubated electively and nderwent EGD where he had banding of varices x 5.  There was apparently steady bleeding even at the end of the procedure; therefore, pt was left intubated and transferred to the ICU for closer observation.  He will have repeat EGD as needed.   SUBJECTIVE: When asked if anything is bothering him, he points to ETT. Able to follow commands. No active bleeding overnight.  Extubated and doing well.  VITAL SIGNS: BP 109/70 mmHg  Pulse 64  Temp(Src) 97.7 F (36.5 C) (Core (Comment))  Resp 16  Ht  (1.727 m)  Wt 215 lb (97.523 kg)  BMI 32.70 kg/m2  SpO2 100%  HEMODYNAMICS:    VENTILATOR SETTINGS: Vent Mode:  [-] PRVC FiO2 (%):  [40 %] 40 % Set Rate:  [16 bmp] 16 bmp Vt Set:  [560 mL] 560 mL PEEP:  [5 cmH20] 5 cmH20 Pressure Support:  [15 cmH20] 15 cmH20 Plateau Pressure:  [19 cmH20-23 cmH20] 20 cmH20  INTAKE / OUTPUT: I/O last 3 completed shifts: In: 7902 [I.V.:7852; IV Piggyback:50] Out: 1750 [Urine:1050; Stool:500; Blood:200]   PHYSICAL EXAMINATION: General: Middle  aged male, appears older than stated age, in NAD. Neuro: Awake, follows commands HEENT: [place]/AT. PERRL, sclerae anicteric. Cardiovascular: RRR, no M/R/G.  Lungs: Respirations even and unlabored.  CTA bilaterally, No W/R/R. Abdomen: Ascites.  Abd, soft, NT/ND.  GU: scrotal edema Musculoskeletal: Pitting edema almost to knee, SCDs in place, left great toe amputation Skin: Intact, warm, no rashes.  LABS:  BMET  Recent Labs Lab 03/30/16 0444 03/30/16 2154 03/31/16 0304  NA 143 138 139  K 4.3 3.6 3.6  CL 97* 107 109  CO2 BUN 34* 33* 34*  CREATININE 3.33* 3.15* 3.19*  GLUCOSE 126* 81 73    Electrolytes  Recent Labs Lab 03/30/16 0444 03/30/16 2154 03/31/16 0304  CALCIUM 9.7 8.1* 7.9*  MG  --   --  1.9  PHOS  --   --  4.4    CBC  Recent Labs Lab 03/30/16 2154 03/31/16 0304 03/31/16 1856  WBC 5.1 3.2* 7.4  HGB 9.2* 8.0* 9.6*  HCT 27.2* 23.9* 30.1*  PLT 73* 56* 71*    Coag's  Recent Labs Lab 03/29/16 1920 03/30/16 2154  APTT 39*  --   INR 1.32 1.40    Sepsis Markers  Recent Labs Lab 03/29/16 1948 03/30/16 1536  LATICACIDVEN 1.69 1.9    ABG  Recent Labs Lab 03/30/16 1705  PHART 7.389  PCO2ART 40.0  PO2ART 214*    Liver Enzymes  Recent Labs Lab 03/29/16 1920 03/30/16 0444  AST 35 34  ALT 24 22  ALKPHOS 144* 125  BILITOT 1.5* 1.4*  ALBUMIN 2.8* 2.5*    Cardiac Enzymes  Recent Labs Lab 03/29/16 1920  TROPONINI <0.03    Glucose  Recent Labs Lab 03/31/16 0828 03/31/16 1155 03/31/16 1516 03/31/16 2003 04/01/16 0013 04/01/16 0411  GLUCAP 129* 111* 100* 107* 112* 130*    Imaging No results found.   STUDIES:  AXR 7/2 > extensive ascites.  CXR 7/2 > no acute process. CXR 7/3 > patchy infiltrate left lung base CXR 7/4 > bilateral perihilar and lower lobe opacities, possible layering effusions  CULTURES: Blood 7/2 > no growth   ANTIBIOTICS: Ceftriaxone 7/2 >  SIGNIFICANT EVENTS: 7/2 > admit. 7/3 >  EGD with banding of varices x 5.  Remained intubated and transferred to ICU.  LINES/TUBES: ETT 7/3 >  DISCUSSION: 65 y.o. M with hx cirrhosis, portal HTN, esophageal varices.  Admitted 7/2 with hematemesis.  On 7/3, he was intubated and underwent EGD with banding of varices x 5.  He remained intubated post procedure and was transferred to the ICU for further evaluation and management. Extubated on July 5. Doing well.  ASSESSMENT / PLAN:  GASTROINTESTINAL A:   Esophageal varices - s/p banding x 5 on 03/30/16 (Dr. Madilyn FiremanHayes). Hx cirrhosis, portal HTN, ascites with monthly paracentesis. Nutrition. P:   GI following, appreciate the assistance. Continue PPI gtt, octreotide gtt. Start clears. May need to start lactulose to keep 3-5 BM/day  HEMATOLOGIC A:   Acute blood loss anemia. 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 VTE Prophylaxis. P:  Transfuse for Hgb < 7. SCD's only. Trend Hgb  PULMONARY A: Respiratory insufficiency - with inability to protect airway due to active hematemesis.  S/p intubation 7/3. Hx COPD without evidence of exacerbation. CXR 7/4 with perihilar and lower lobe opacities, possible layering effusions Extubated on 7/5 P:   Did well with PST this morning. Patient extubated and doing well so far. VAP prevention measures. Continue albuterol PRN. Chest x-ray with bilateral effusion. Diuresce.   CARDIOVASCULAR A:  Hypotension - likely sedation related vs hemorrhagic 2/2 hematemesis. Improved. MAPs 79-83 Hx HTN, HLD. P:  Monitor hemodynamics. Goal MAP > 65. Lactate reassuring Will give Bumex 1mg  x 1 this morning. Monitor UOP, may give another dose this afternoon Recheck BMET at 1500 Holding home Spironolactone and Torsemide  RENAL A:   AoCKD, Cr 3.25 > 3.33 > 3.19 Following overload in a patient with cirrhosis. P:   Daily BMPs Diuresis.  INFECTIOUS A:   SBP prophylaxis. P:   Abx as above  (Ceftriaxone).   ENDOCRINE A:   DM.   Hypothyroidism. P:   SSI. Continue outpatient synthroid, (IV formulation)  NEUROLOGIC A:   Acute encephalopathy. P:   Start Lactulose 30ml tid to keep 3-5 BMs Sedation > will DC as he is extubated. RASS goal: 0 to -1. Hold outpatient alprazolam, gabapentin, oxycodone, ropinirole, tizanidine.   Interdisciplinary Family Meeting v Palliative Care Meeting:  Due by: 07/10.  CC time: 35 minutes.  Willadean CarolKaty Mayo, MD Family Medicine, PGY-2   ATTENDING NOTE / ATTESTATION NOTE :   I have discussed the case with the resident/APP Dr. Willadean CarolKaty Mayo  I agree with the resident/APP's  history, physical examination, assessment, and plans.    I have edited the above note and modified it according to our agreed history, physical examination, assessment and plan.  I have spent 35  minutes of critical care time with this patient today.  Family :Family updated at length today.  Spoke to wife about pts condition.    Pollie MeyerJ. Angelo A de Dios, MD 04/01/2016, 1:49 PM [facility] Pager [phone] After 3 pm or if no answer, call [phone]

## 2016-04-01 NOTE — Procedures (Signed)
Extubation Procedure Note  Patient Details:   Name: [name] [name] DOB: 1950-09-29 MRN: [id number]   Airway Documentation:     Evaluation  O2 sats: stable throughout Complications: No apparent complications Patient did tolerate procedure well. Bilateral Breath Sounds: Clear, Diminished   Yes  Patient tolerated wean. MD ordered to extubate. Positive for cuff leak. Patient extubated to 4 Lpm nasal cannula. No signs of dyspnea or stridor. Patient instructed on the Incentive Spirometer achieving 1000 mL. RN at bedside. Will continue to monitor.  [name], [name] [name] 04/01/2016, 9:48 AM

## 2016-04-02 DIAGNOSIS — G9340 Encephalopathy, unspecified: Secondary | ICD-10-CM

## 2016-04-02 DIAGNOSIS — E162 Hypoglycemia, unspecified: Secondary | ICD-10-CM

## 2016-04-02 DIAGNOSIS — I8511 Secondary esophageal varices with bleeding: Secondary | ICD-10-CM

## 2016-04-02 DIAGNOSIS — N179 Acute kidney failure, unspecified: Secondary | ICD-10-CM

## 2016-04-02 DIAGNOSIS — N189 Chronic kidney disease, unspecified: Secondary | ICD-10-CM

## 2016-04-02 LAB — BASIC METABOLIC PANEL
ANION GAP: 7 (ref 5–15)
BUN: 30 mg/dL — ABNORMAL HIGH (ref 6–20)
CALCIUM: 8.3 mg/dL — AB (ref 8.9–10.3)
CHLORIDE: 108 mmol/L (ref 101–111)
CO2: 24 mmol/L (ref 22–32)
Creatinine, Ser: 2.94 mg/dL — ABNORMAL HIGH (ref 0.61–1.24)
GFR calc non Af Amer: 21 mL/min — ABNORMAL LOW (ref >60)
GFR, EST AFRICAN AMERICAN: 24 mL/min — AB (ref >60)
GLUCOSE: 124 mg/dL — AB (ref 65–99)
POTASSIUM: 3.5 mmol/L (ref 3.5–5.1)
Sodium: 139 mmol/L (ref 135–145)

## 2016-04-02 LAB — CBC WITH DIFFERENTIAL/PLATELET
BASOS PCT: 0 %
Basophils Absolute: 0 10*3/uL (ref 0.0–0.1)
EOS ABS: 0.2 10*3/uL (ref 0.0–0.7)
EOS PCT: 2 %
HCT: 26.8 % — ABNORMAL LOW (ref 39.0–52.0)
HEMOGLOBIN: 9.1 g/dL — AB (ref 13.0–17.0)
LYMPHS ABS: 1.2 10*3/uL (ref 0.7–4.0)
Lymphocytes Relative: 16 %
MCH: 31.3 pg (ref 26.0–34.0)
MCHC: 34 g/dL (ref 30.0–36.0)
MCV: 92.1 fL (ref 78.0–100.0)
MONOS PCT: 7 %
Monocytes Absolute: 0.5 10*3/uL (ref 0.1–1.0)
NEUTROS PCT: 74 %
Neutro Abs: 5.5 10*3/uL (ref 1.7–7.7)
PLATELETS: 70 10*3/uL — AB (ref 150–400)
RBC: 2.91 MIL/uL — ABNORMAL LOW (ref 4.22–5.81)
RDW: 14.8 % (ref 11.5–15.5)
WBC: 7.5 10*3/uL (ref 4.0–10.5)

## 2016-04-02 LAB — CBC
HCT: 27.2 % — ABNORMAL LOW (ref 39.0–52.0)
Hemoglobin: 9.1 g/dL — ABNORMAL LOW (ref 13.0–17.0)
MCH: 31.2 pg (ref 26.0–34.0)
MCHC: 33.5 g/dL (ref 30.0–36.0)
MCV: 93.2 fL (ref 78.0–100.0)
PLATELETS: 68 10*3/uL — AB (ref 150–400)
RBC: 2.92 MIL/uL — AB (ref 4.22–5.81)
RDW: 14.7 % (ref 11.5–15.5)
WBC: 7 10*3/uL (ref 4.0–10.5)

## 2016-04-02 LAB — GLUCOSE, CAPILLARY
GLUCOSE-CAPILLARY: 136 mg/dL — AB (ref 65–99)
GLUCOSE-CAPILLARY: 155 mg/dL — AB (ref 65–99)
GLUCOSE-CAPILLARY: 141 mg/dL — AB (ref 65–99)
Glucose-Capillary: 143 mg/dL — ABNORMAL HIGH (ref 65–99)
Glucose-Capillary: 147 mg/dL — ABNORMAL HIGH (ref 65–99)
Glucose-Capillary: 96 mg/dL (ref 65–99)

## 2016-04-02 MED ORDER — DEXTROSE-NACL 5-0.9 % IV SOLN
INTRAVENOUS | Status: DC
  Administered 2016-04-02 – 2016-04-04 (×2): via INTRAVENOUS

## 2016-04-02 MED ORDER — LEVOTHYROXINE SODIUM 75 MCG PO TABS
150.0000 ug | ORAL_TABLET | Freq: Every day | ORAL | Status: DC
  Administered 2016-04-03 – 2016-04-04 (×2): 150 ug via ORAL
  Filled 2016-04-02 (×2): qty 2

## 2016-04-02 MED ORDER — DEXTROSE-NACL 5-0.9 % IV SOLN: INTRAVENOUS | Status: DC

## 2016-04-02 MED ORDER — BUMETANIDE 0.25 MG/ML IJ SOLN
1.0000 mg | Freq: Once | INTRAMUSCULAR | Status: AC
  Administered 2016-04-02: 1 mg via INTRAVENOUS
  Filled 2016-04-02: qty 4

## 2016-04-02 MED ORDER — PANTOPRAZOLE SODIUM 40 MG IV SOLR
40.0000 mg | Freq: Two times a day (BID) | INTRAVENOUS | Status: DC
  Administered 2016-04-02 – 2016-04-03 (×3): 40 mg via INTRAVENOUS
  Filled 2016-04-02 (×4): qty 40

## 2016-04-02 NOTE — Progress Notes (Signed)
Report called to [facility] Jennifer RN. Pt to be transported via wheelchair, monitor, and RN.

## 2016-04-02 NOTE — Progress Notes (Signed)
PULMONARY / CRITICAL CARE MEDICINE   Name: [name] [name] MRN: [id number] DOB: [birth year]    ADMISSION DATE:  03/29/2016 CONSULTATION DATE:  078/02/17  REFERRING MD:  Madilyn FiremanHayes (GI)  CHIEF COMPLAINT:  Hematemesis  HISTORY OF PRESENT ILLNESS:  Pt is encephelopathic; therefore, this HPI is obtained from chart review. [name] [name] is a 65 y.o. male with PMH as outlined below including cirrhosis, portal HTN, gastric and esophageal varices ([place] by [place]. [place]k [place]).  [facility] with 4 episodes of hematemesis and while in ED also had 1 large episode of hematochezia.  He apparently has had hematemesis before.  He had EGD in Oct 2016 which showed large esophageal varices with no active bleeding as well as portal gastropathy. He undergoes paracentesis every month, last one was June 26 when he had 7L fluid removed.  SUBJECTIVE: Pt states he is doing well this morning. He feels much better after having the ETT out. He did have 3 episodes of pink-tinged emesis yesterday afternoon and was made NPO. He denies any nausea or abdominal pain this morning.  VITAL SIGNS: BP 122/86 mmHg  Pulse 76  Temp(Src) 97.2 F (36.2 C) (Core (Comment))  Resp 17  Ht 5\' 8"  (1.727 m)  Wt 97.523 kg (215 lb)  BMI 32.70 kg/m2  SpO2 95%  HEMODYNAMICS:    VENTILATOR SETTINGS: Vent Mode:  [-] PSV;CPAP FiO2 (%):  [40 %] 40 % PEEP:  [5 cmH20] 5 cmH20 Pressure Support:  [5 cmH20] 5 cmH20  INTAKE / OUTPUT: I/O last 3 completed shifts: In: 5056.1 [I.V.:4956.1; IV Piggyback:100] Out: 3475 [Urine:3475]   PHYSICAL EXAMINATION: General: Middle aged male, appears older than stated age, in NAD, talkative. Neuro: Awake, answers questions appropriately, follows commands, moves all extremities HEENT: [place]/AT. PERRL, sclerae anicteric. Cardiovascular: RRR, no M/R/G.  Lungs: Crackles to mid-lung bilaterally, lungs otherwise clear, normal work of breathing. Abdomen: Abdomen distended but soft, fluid shift  present, non-tender Musculoskeletal: Trace pitting edema, much improved from yesterday, SCDs in place, left great toe amputation Skin: Intact, warm, no rashes.  LABS:  BMET  Recent Labs Lab 04/01/16 0805 04/01/16 1356 04/02/16 0330  NA 138 139 139  K 3.7 3.7 3.5  CL 111 112* 108  CO2 22 20* 24  BUN 32* 32* 30*  CREATININE 3.17* 3.14* 2.94*  GLUCOSE 108* 124* 124*    Electrolytes  Recent Labs Lab 03/31/16 0304 04/01/16 0805 04/01/16 1356 04/02/16 0330  CALCIUM 7.9* 8.0* 8.1* 8.3*  MG 1.9  --   --   --   PHOS 4.4  --   --   --     CBC  Recent Labs Lab 03/31/16 1856 04/01/16 0805 04/02/16 0330  WBC 7.4 8.4 7.0  HGB 9.6* 9.3* 9.1*  HCT 30.1* 28.7* 27.2*  PLT 71* 83* 68*    Coag's  Recent Labs Lab 03/29/16 1920 03/30/16 2154  APTT 39*  --   INR 1.32 1.40    Sepsis Markers  Recent Labs Lab 03/29/16 1948 03/30/16 1536  LATICACIDVEN 1.69 1.9    ABG  Recent Labs Lab 03/30/16 1705  PHART 7.389  PCO2ART 40.0  PO2ART 214*    Liver Enzymes  Recent Labs Lab 03/29/16 1920 03/30/16 0444  AST 35 34  ALT 24 22  ALKPHOS 144* 125  BILITOT 1.5* 1.4*  ALBUMIN 2.8* 2.5*    Cardiac Enzymes  Recent Labs Lab 03/29/16 1920  TROPONINI <0.03    Glucose  Recent Labs Lab 04/01/16 0813  04/01/16 1152 04/01/16 1551 04/01/16 1944 04/01/16 2335 04/02/16 0333  GLUCAP 102* 123* 138* 138* 143* 136*    Imaging No results found.   STUDIES:  Abd XR 7/2 > extensive ascites.  Port CXR 7/2 > no acute process. Port CXR 7/3 > patchy infiltrate left lung base Port CXR 7/4 > bilateral perihilar and lower lobe opacities, possible layering effusions  MICROBIOLOGY: MRSA PCR 7/3:  Negative Blood Ctx x2 7/3>>>  ANTIBIOTICS: Ceftriaxone 7/2 >  SIGNIFICANT EVENTS: 7/2 - admit. 7/3 - EGD with banding of varices x 5.  Remained intubated and transferred to ICU. 7/5 - Extubated to [place]  LINES/TUBES: ETT 7/3 - 7/5 Foley 7/3>>> PIV  x3  ASSESSMENT / PLAN:  GASTROINTESTINAL A:   Upper GIB - Secondary to Esophageal Varices. S/P Banding x5 7/3 by Dr. Madilyn FiremanHayes H/O Liver Cirrhosis w/ Ascites - Monthly paracentesis (last 6/29 w/ 7L removed). H/O Portal HTN  P:   GI Following Transition to Protonix BID Discontinue Octreotide and Protonix drips Restarting clear liquid diet Continuing Lactulose tid for goal 3-5 BM/day Zofran IV prn  HEMATOLOGIC A:   Anemia - Secondary to acute blood loss. Hgb stable. No active bleeding. Thrombocytopenia - Chronic.   P:  Trending cell counts q12hr w/ CBC Transfuse for Hgb < 7. SCD's  No chemical DVT prophylaxis given GIB  PULMONARY A: Acute Hypoxic Respiratory Failure - Secondary to pulmonary edema. Improving. H/O COPD - No exacerbation.  P:   Continuous Pulse Oximetry Incentive Spirometry q1hr while awake Albuterol neb prn Bumex 1mg  IV x1 this AM OOB to chair  CARDIOVASCULAR A:  Hypotension - Resolved. Secondary to GIB.  H/O HTN H/O Hyperlipidemia  P:  Continuous telemetry monitoring Vitals per unit protocol Consider restarting Torsemide & Aldactone tomorrow Lipitor 40mg  qhs in place of Zocor  RENAL A:   Acute on Chronic Renal Failure - Resolving. Baseline 2.8-3.3. Volume Overload - Improving w/ diuresis.  P:   Trending UOP Intermittent Bumex IV diuresis Monitoring electrolytes & renal function daily Plan to stop D5NS once taking adequate PO  INFECTIOUS A:   SBP Prophylaxis  P:   Empiric Rocephin Awaiting culture result  ENDOCRINE A:   Hypoglycemia H/O DM Type II  H/O Hypothyroidism  P:   Accu-Checks q4hr w/ MD notification parameters D5NS @ 50cc/hr SSI per Moderate Algorithm Switch back to Synthroid 150mcg PO daily Holding Lantus  NEUROLOGIC A:   Acute Encephalopathy - Resolved. Likely multi-factorial w/ GIB & cirrhosis. Chronic Pain  P:   Continue Lactulose 30ml tid to keep 3-5 BMs Hold outpatient alprazolam, gabapentin,  oxycodone, ropinirole, tizanidine.   Interdisciplinary Family Meeting v Palliative Care Meeting:  Due by: 07/10.  Patient Summary: Pt admitted with hematemesis. Was intubated and underwent EGD with banding of varices x 5. Remained intubated post procedure. Did well with PST, so was extubated on 7/5. Has done well since and is stable on 1L [place]. Plan for transfer to stepdown unit today.  Willadean CarolKaty Mayo, MD Family Medicine, PGY-2  PCCM Attending Note: Patient seen and examined with resident physician. Please refer to her note which I have reviewed in detail. Patient denies any current abdominal pain or nausea. He did have emesis of a pink tinged gastric content yesterday. No melena or hematochezia. No chest pain or pressure. No dyspnea or cough.  REVIEW OF SYSTEMS: No subjective fever, chills, or sweats. No headache or vision changes.  BP 144/104 mmHg  Pulse 75  Temp(Src) 98 F (36.7 C) (Oral)  Resp 18  Ht  (1.727 m)  Wt 215 lb (97.523 kg)  BMI 32.70 kg/m2  SpO2 100% Gen.: Patient sitting up in chair at bedside. Watching TV. Integument: Warm and dry. No rash on exposed skin. Abdomen: Soft. Protuberant. Nontender. Pulmonary: Normal work of breathing. Speaking in complete sentences. No accessory muscle use. Neurological: Alert and oriented 4. Moving all 4 extremities equally. Grossly nonfocal.  CBC Latest Ref Rng 04/02/2016 04/01/2016 03/31/2016  WBC 4.0 - 10.5 K/uL 7.0 8.4 7.4  Hemoglobin 13.0 - 17.0 g/dL 2.8(U) 1.3(K) 4.4(W)  Hematocrit 39.0 - 52.0 % 27.2(L) 28.7(L) 30.1(L)  Platelets 150 - 400 K/uL 68(L) 83(L) 71(L)   A/P:  65 year old male with history of multiple medical problems including liver cirrhosis with ascites, diabetes, and COPD. Patient admitted for upper GI bleed  with intubation and EGD showing esophageal varices now status post banding. Patient required no blood products. Reattempting to initiate clear liquid diet. Hypoxic respiratory failure likely secondary to some volume  overload. Patient responded to IV Bumex. Can likely restart home oral diuretic therapy tomorrow. Given patient's continued clinical stability and improvement I feel it's reasonable to transition to a stepdown unit.   1. Upper GI bleed: Secondary to esophageal varices status post banding on 7/3. Transitioning off octreotide & Protonix infusions. Starting Protonix IV twice a day. Continuing to trend hemoglobin/hematocrit every 12 hours. Continuing SBP prophylaxis with Rocephin daily. 2. Acute hypoxic respiratory failure: Secondary to volume overload/pulmonary edema. Bumex IV 1 this morning. Continuing to wean FiO2. Incentive spirometry for pulmonary toilette. 3. Liver cirrhosis with ascites: No evidence of encephalopathy. Last paracentesis 6/29. Continuing lactulose 3 times a day. Unlikely resume torsemide & Aldactone tomorrow. 4. Acute on chronic renal failure: Continuing to improve with diuresis. Monitor electrolytes & renal function daily.  5. Hypoglycemia: Likely secondary to cirrhosis and poor by mouth intake. Continuing D5 normal saline. Plan to discontinue IV fluids once patient taking consistent diet. 6. Acute encephalopathy: Resolved. 7. Chronic pain: Holding home medications given altered mental status. Can likely cautiously resume starting tomorrow. 8. Disposition: Transitioning patient to stepdown unit bed.  TRH to assume care & PCCM to sign off as of 7/7. TR[facility] contacted by resident physician.  Donna Christen Jamison Neighbor, M.D. [facility]monary [facility] Critical [facility] Pager:  [phone] After 3pm or if no response, call [phone] 2:34 PM 04/02/2016

## 2016-04-02 NOTE — Consult Note (Deleted)
PULMONARY / CRITICAL CARE MEDICINE   Name: [name] [name] MRN: [id number] DOB: 24-Nov-1950    ADMISSION DATE:  03/29/2016 CONSULTATION DATE:  078/02/17  REFERRING MD:  Madilyn FiremanHayes (GI)  CHIEF COMPLAINT:  Hematemesis  HISTORY OF PRESENT ILLNESS:  Pt is encephelopathic; therefore, this HPI is obtained from chart review. [name] [name] is a 65 y.o. male with PMH as outlined below including cirrhosis, portal HTN, gastric and esophageal varices (managed by Dr. Jena Gaussourk in [place]).  [facility]02 with 4 episodes of hematemesis and while in ED also had 1 large episode of hematochezia.  He apparently has had hematemesis before.  He had EGD in Oct 2016 which showed large esophageal varices with no active bleeding as well as portal gastropathy. He undergoes paracentesis every month, last one was June 26 when he had 7L fluid removed.  On 7/3, he was evaluated by Dr. Madilyn FiremanHayes of GI.  He was intubated electively and nderwent EGD where he had banding of varices x 5.  There was apparently steady bleeding even at the end of the procedure; therefore, pt was left intubated and transferred to the ICU for closer observation.  He will have repeat EGD as needed.  On 7/5, he was extubated and has done well since.   SUBJECTIVE: Pt states he is doing well this morning. He feels much better after having the ETT out. He did have 3 episodes of pink-tinged emesis yesterday afternoon and was made NPO. He denies any nausea or abdominal pain this morning.   VITAL SIGNS: BP 122/86 mmHg  Pulse 76  Temp(Src) 97.2 F (36.2 C) (Core (Comment))  Resp 17  Ht 5\' 8"  (1.727 m)  Wt 97.523 kg (215 lb)  BMI 32.70 kg/m2  SpO2 95%  HEMODYNAMICS:    VENTILATOR SETTINGS: Vent Mode:  [-] PSV;CPAP FiO2 (%):  [40 %] 40 % PEEP:  [5 cmH20] 5 cmH20 Pressure Support:  [5 cmH20] 5 cmH20  INTAKE / OUTPUT: I/O last 3 completed shifts: In: 5056.1 [I.V.:4956.1; IV Piggyback:100] Out: 3475 [Urine:3475]   PHYSICAL  EXAMINATION: General: Middle aged male, appears older than stated age, in NAD, talkative. Neuro: Awake, answers questions appropriately, follows commands, moves all extremities HEENT: [place]/AT. PERRL, sclerae anicteric. Cardiovascular: RRR, no M/R/G.  Lungs: Crackles to mid-lung bilaterally, lungs otherwise clear, normal work of breathing. Abdomen: Abdomen distended but soft, fluid shift present, non-tender Musculoskeletal: Trace pitting edema, much improved from yesterday, SCDs in place, left great toe amputation Skin: Intact, warm, no rashes.  LABS:  BMET  Recent Labs Lab 04/01/16 0805 04/01/16 1356 04/02/16 0330  NA 138 139 139  K 3.7 3.7 3.5  CL 111 112* 108  CO2 22 20* 24  BUN 32* 32* 30*  CREATININE 3.17* 3.14* 2.94*  GLUCOSE 108* 124* 124*    Electrolytes  Recent Labs Lab 03/31/16 0304 04/01/16 0805 04/01/16 1356 04/02/16 0330  CALCIUM 7.9* 8.0* 8.1* 8.3*  MG 1.9  --   --   --   PHOS 4.4  --   --   --     CBC  Recent Labs Lab 03/31/16 1856 04/01/16 0805 04/02/16 0330  WBC 7.4 8.4 7.0  HGB 9.6* 9.3* 9.1*  HCT 30.1* 28.7* 27.2*  PLT 71* 83* 68*    Coag's  Recent Labs Lab 03/29/16 1920 03/30/16 2154  APTT 39*  --   INR 1.32 1.40    Sepsis Markers  Recent Labs Lab 03/29/16 1948 03/30/16 1536  LATICACIDVEN 1.69 1.9  ABG  Recent Labs Lab 03/30/16 1705  PHART 7.389  PCO2ART 40.0  PO2ART 214*    Liver Enzymes  Recent Labs Lab 03/29/16 1920 03/30/16 0444  AST 35 34  ALT 24 22  ALKPHOS 144* 125  BILITOT 1.5* 1.4*  ALBUMIN 2.8* 2.5*    Cardiac Enzymes  Recent Labs Lab 03/29/16 1920  TROPONINI <0.03    Glucose  Recent Labs Lab 04/01/16 0813 04/01/16 1152 04/01/16 1551 04/01/16 1944 04/01/16 2335 04/02/16 0333  GLUCAP 102* 123* 138* 138* 143* 136*    Imaging No results found.   STUDIES:  AXR 7/2 > extensive ascites.  CXR 7/2 > no acute process. CXR 7/3 > patchy infiltrate left lung base CXR 7/4 >  bilateral perihilar and lower lobe opacities, possible layering effusions  CULTURES: Blood 7/2 > no growth   ANTIBIOTICS: Ceftriaxone 7/2 >  SIGNIFICANT EVENTS: 7/2 > admit. 7/3 > EGD with banding of varices x 5.  Remained intubated and transferred to ICU. 7/5 > Extubated to [place], doing well  LINES/TUBES: ETT 7/3 > 7/5  DISCUSSION: 65 y.o. M with hx cirrhosis, portal HTN, esophageal varices.  Admitted 7/2 with hematemesis.  On 7/3, he was intubated and underwent EGD with banding of varices x 5.  He remained intubated post procedure and was transferred to the ICU for further evaluation and management. Extubated on July 5. Doing well since.  ASSESSMENT / PLAN:  GASTROINTESTINAL A:   Esophageal varices - s/p banding x 5 on 03/30/16 (Dr. Madilyn Fireman). Hx cirrhosis, portal HTN, ascites with monthly paracentesis. Last paracentesis 6/29 with 7 L removed. Currently NPO P:   GI following, appreciate the assistance.  Continue PPI gtt, octreotide gtt. PPI gtt is not indicated past 72 hours (today). Will defer to GI, but I think we can transition to intermittent dosing. NPO after emesis x 3 yesterday. Consider trying clears again today. Lactulose 30g tid to keep 3-5 BM/day  HEMATOLOGIC A:   Acute blood loss anemia- stable. Thrombocytopenia - chronic and worsening- likely due to bone marrow suppression from ETOH. VTE Prophylaxis. P:  Transfuse for Hgb < 7. SCD's only. Trend Hgb  PULMONARY A: Respiratory insufficiency - with inability to protect airway due to active hematemesis.  S/p intubation 7/3. Hx COPD without evidence of exacerbation. CXR 7/4 with perihilar and lower lobe opacities, possible layering effusions Extubated on 7/5 P:   Continue albuterol PRN. S/p Bumex  x 2 yesterday. Will give another dose this morning. K and Cr stable. Holding home Torsemide and Spironolactone while NPO.  CARDIOVASCULAR A:  Hypotension - resolved. Likely sedation related vs hemorrhagic 2/2  hematemesis.  Hx HTN, HLD. P:  Monitor hemodynamics. Goal MAP > 65. Lactate reassuring Diuresing as above. Holding home Spironolactone and Torsemide  RENAL A:   AoCKD- improving. Cr 3.25 > 3.33 > 2.94 (baseline 2.8-3.3) Volume overloaded 2/2 cirrhosis. P:   Daily BMPs Diuresis as above Gentle IVFs D5NS at 66ml/hr, given that he is NPO. Will stop fluids when able to take PO.  INFECTIOUS A:   SBP prophylaxis. P:   Abx as above (Ceftriaxone).   ENDOCRINE A:   DM.   Hypothyroidism. P:   SSI. Continue outpatient synthroid, (IV formulation)  NEUROLOGIC A:   Acute encephalopathy. P:   Continue Lactulose 30ml tid to keep 3-5 BMs Hold outpatient alprazolam, gabapentin, oxycodone, ropinirole, tizanidine.   Interdisciplinary Family Meeting v Palliative Care Meeting:  Due by: 07/10.  Patient Summary: Pt admitted with hematemesis. Was intubated and underwent EGD with  banding of varices x 5. Remained intubated post procedure. Did well with PST, so was extubated on 7/5. Has done well since and is stable on 1L [place]. Plan for transfer to stepdown unit today.  Willadean CarolKaty Mechelle Pates, MD Family Medicine, PGY-2

## 2016-04-02 NOTE — Care Management Important Message (Signed)
Important Message  Patient Details  Name: Darl HouseholderRoy L Cornforth MRN: [id number] Date of Birth: [birth year]   Medicare Important Message Given:  Yes    Bernadette HoitShoffner, Declyn Offield Coleman 04/02/2016, 10:22 AM

## 2016-04-02 NOTE — Progress Notes (Signed)
[facility] Progress Note  Subjective: Patient extubated having some vomiting or regurgitation yesterday slightly blood-tinged, back to nothing by mouth. No stool in last 24 hours. Sitting up in chair alert and oriented  Objective: Vital signs in last 24 hours: Temp:  [97 F (36.1 C)-98 F (36.7 C)] 98 F (36.7 C) (07/06 1137) Pulse Rate:  [70-92] 75 (07/06 0800) Resp:  [8-27] 18 (07/06 0800) BP: (98-170)/(62-127) 144/104 mmHg (07/06 0800) SpO2:  [89 %-100 %] 100 % (07/06 0800) Weight change:    PE: In chair alert and oriented  Lab Results: Results for orders placed or performed during the hospital encounter of 03/29/16 (from the past 24 hour(s))  Basic metabolic panel     Status: Abnormal   Collection Time: 04/01/16  1:56 PM  Result Value Ref Range   Sodium 139 135 - 145 mmol/L   Potassium 3.7 3.5 - 5.1 mmol/L   Chloride 112 (H) 101 - 111 mmol/L   CO2 20 (L) 22 - 32 mmol/L   Glucose, Bld 124 (H) 65 - 99 mg/dL   BUN 32 (H) 6 - 20 mg/dL   Creatinine, Ser 0.453.14 (H) 0.61 - 1.24 mg/dL   Calcium 8.1 (L) 8.9 - 10.3 mg/dL   GFR calc non Af Amer 19 (L) >60 mL/min   GFR calc Af Amer 22 (L) >60 mL/min   Anion gap 7 5 - 15  Glucose, capillary     Status: Abnormal   Collection Time: 04/01/16  3:51 PM  Result Value Ref Range   Glucose-Capillary 138 (H) 65 - 99 mg/dL  Glucose, capillary     Status: Abnormal   Collection Time: 04/01/16  7:44 PM  Result Value Ref Range   Glucose-Capillary 138 (H) 65 - 99 mg/dL  Glucose, capillary     Status: Abnormal   Collection Time: 04/01/16 11:35 PM  Result Value Ref Range   Glucose-Capillary 143 (H) 65 - 99 mg/dL  Basic metabolic panel     Status: Abnormal   Collection Time: 04/02/16  3:30 AM  Result Value Ref Range   Sodium 139 135 - 145 mmol/L   Potassium 3.5 3.5 - 5.1 mmol/L   Chloride 108 101 - 111 mmol/L   CO2 24 22 - 32 mmol/L   Glucose, Bld 124 (H) 65 - 99 mg/dL   BUN 30 (H) 6 - 20 mg/dL   Creatinine, Ser 4.092.94 (H) 0.61 -  1.24 mg/dL   Calcium 8.3 (L) 8.9 - 10.3 mg/dL   GFR calc non Af Amer 21 (L) >60 mL/min   GFR calc Af Amer 24 (L) >60 mL/min   Anion gap 7 5 - 15  CBC     Status: Abnormal   Collection Time: 04/02/16  3:30 AM  Result Value Ref Range   WBC 7.0 4.0 - 10.5 K/uL   RBC 2.92 (L) 4.22 - 5.81 MIL/uL   Hemoglobin 9.1 (L) 13.0 - 17.0 g/dL   HCT 81.127.2 (L) 91.439.0 - 78.252.0 %   MCV 93.2 78.0 - 100.0 fL   MCH 31.2 26.0 - 34.0 pg   MCHC 33.5 30.0 - 36.0 g/dL   RDW 95.614.7 21.311.5 - 08.615.5 %   Platelets 68 (L) 150 - 400 K/uL  Glucose, capillary     Status: Abnormal   Collection Time: 04/02/16  3:33 AM  Result Value Ref Range   Glucose-Capillary 136 (H) 65 - 99 mg/dL  Glucose, capillary     Status: None   Collection Time: 04/02/16  7:59 AM  Result Value Ref Range   Glucose-Capillary 96 65 - 99 mg/dL    Studies/Results: No results found.    Assessment: Esophageal variceal bleed status post banding with prolonged intubation for airway protection, seems stable  Plan: Resume clear liquid diet DC octreotide upon transfer Continue PPI Continue to Monitor stools and hemoglobin    Dustyn Dansereau C 04/02/2016, 12:37 PM  Pager [phone] If no answer or after 5 PM call [phone]

## 2016-04-02 NOTE — Care Management Note (Signed)
Case Management Note  Patient Details  Name: Darl HouseholderRoy L Thayer MRN: [id number] Date of Birth: 1950/10/30  Subjective/Objective:      Pt admitted with hematemesis - chronic paracentesis for cirrhosis              Action/Plan:  Pt is from home with wife.  [facility] - CM will continue to follow for discharge needs   Expected Discharge Date:                  Expected Discharge Plan:  Home w Home Health Services  In-House Referral:     Discharge planning Services  CM Consult  Post Acute Care Choice:    Choice offered to:     DME Arranged:    DME Agency:     HH Arranged:    HH Agency:     Status of Service:  In process, will continue to follow  If discussed at Long Length of Stay Meetings, dates discussed:    Additional Comments:  Cherylann ParrClaxton, Meiko Ives S, RN 04/02/2016, 9:48 AM

## 2016-04-03 DIAGNOSIS — K7031 Alcoholic cirrhosis of liver with ascites: Secondary | ICD-10-CM

## 2016-04-03 DIAGNOSIS — K920 Hematemesis: Secondary | ICD-10-CM

## 2016-04-03 DIAGNOSIS — R110 Nausea: Secondary | ICD-10-CM

## 2016-04-03 LAB — CBC WITH DIFFERENTIAL/PLATELET
BASOS ABS: 0 10*3/uL (ref 0.0–0.1)
BASOS PCT: 1 %
EOS ABS: 0.2 10*3/uL (ref 0.0–0.7)
EOS PCT: 4 %
HCT: 25.1 % — ABNORMAL LOW (ref 39.0–52.0)
Hemoglobin: 8.4 g/dL — ABNORMAL LOW (ref 13.0–17.0)
Lymphocytes Relative: 20 %
Lymphs Abs: 1.1 10*3/uL (ref 0.7–4.0)
MCH: 30.7 pg (ref 26.0–34.0)
MCHC: 33.5 g/dL (ref 30.0–36.0)
MCV: 91.6 fL (ref 78.0–100.0)
MONO ABS: 0.6 10*3/uL (ref 0.1–1.0)
Monocytes Relative: 11 %
Neutro Abs: 3.6 10*3/uL (ref 1.7–7.7)
Neutrophils Relative %: 65 %
PLATELETS: 70 10*3/uL — AB (ref 150–400)
RBC: 2.74 MIL/uL — ABNORMAL LOW (ref 4.22–5.81)
RDW: 14.9 % (ref 11.5–15.5)
WBC: 5.5 10*3/uL (ref 4.0–10.5)

## 2016-04-03 LAB — GLUCOSE, CAPILLARY
GLUCOSE-CAPILLARY: 166 mg/dL — AB (ref 65–99)
GLUCOSE-CAPILLARY: 132 mg/dL — AB (ref 65–99)
GLUCOSE-CAPILLARY: 151 mg/dL — AB (ref 65–99)
Glucose-Capillary: 93 mg/dL (ref 65–99)
Glucose-Capillary: 149 mg/dL — ABNORMAL HIGH (ref 65–99)
Glucose-Capillary: 119 mg/dL — ABNORMAL HIGH (ref 65–99)

## 2016-04-03 LAB — RENAL FUNCTION PANEL
ALBUMIN: 2.1 g/dL — AB (ref 3.5–5.0)
Anion gap: 5 (ref 5–15)
BUN: 29 mg/dL — AB (ref 6–20)
CALCIUM: 8.3 mg/dL — AB (ref 8.9–10.3)
CO2: 26 mmol/L (ref 22–32)
CREATININE: 2.81 mg/dL — AB (ref 0.61–1.24)
Chloride: 110 mmol/L (ref 101–111)
GFR calc Af Amer: 25 mL/min — ABNORMAL LOW (ref >60)
GFR, EST NON AFRICAN AMERICAN: 22 mL/min — AB (ref >60)
Glucose, Bld: 155 mg/dL — ABNORMAL HIGH (ref 65–99)
PHOSPHORUS: 3.7 mg/dL (ref 2.5–4.6)
Potassium: 3.6 mmol/L (ref 3.5–5.1)
Sodium: 141 mmol/L (ref 135–145)

## 2016-04-03 LAB — MAGNESIUM: MAGNESIUM: 1.6 mg/dL — AB (ref 1.7–2.4)

## 2016-04-03 MED ORDER — NEPRO/CARBSTEADY PO LIQD
237.0000 mL | Freq: Two times a day (BID) | ORAL | Status: DC
  Administered 2016-04-03: 237 mL via ORAL

## 2016-04-03 MED ORDER — FUROSEMIDE 40 MG PO TABS
40.0000 mg | ORAL_TABLET | Freq: Every day | ORAL | Status: DC
  Administered 2016-04-03 – 2016-04-04 (×2): 40 mg via ORAL
  Filled 2016-04-03 (×2): qty 1

## 2016-04-03 MED ORDER — INSULIN ASPART 100 UNIT/ML ~~LOC~~ SOLN
0.0000 [IU] | Freq: Three times a day (TID) | SUBCUTANEOUS | Status: DC
  Administered 2016-04-04 (×2): 2 [IU] via SUBCUTANEOUS

## 2016-04-03 MED ORDER — PANTOPRAZOLE SODIUM 40 MG PO TBEC
40.0000 mg | DELAYED_RELEASE_TABLET | Freq: Every day | ORAL | Status: DC
  Administered 2016-04-03 – 2016-04-04 (×2): 40 mg via ORAL
  Filled 2016-04-03 (×2): qty 1

## 2016-04-03 MED ORDER — SPIRONOLACTONE 50 MG PO TABS
100.0000 mg | ORAL_TABLET | Freq: Once | ORAL | Status: AC
  Administered 2016-04-03: 100 mg via ORAL
  Filled 2016-04-03: qty 2

## 2016-04-03 NOTE — Progress Notes (Signed)
04/03/2016 2:49 PM  Pt transferred to 6E05 from [facility] via wheelchair accompanied by his wife.  Pt fully alert and oriented, skin intact, denies pain.  Full assessment to EPIC.  Vitals stable.  Placed on telemetry box 5, confirmed with CCMD and Hope PigeonAshley Matthews, NT.  Pt fall risk score of 9 (moderate), explained necessary interventions to patient and wife, and they both verbalized understanding.  Pt and wife oriented to room/unit, and were instructed on how to utilize the call bell, to which they verbalized understanding.  Will continue to monitor patient. Theadora RamaKIRKMAN, Koal Eslinger [place]

## 2016-04-03 NOTE — Progress Notes (Signed)
[facility] Progress Note  Subjective: Patient feels okay, no more vomiting or regurgitation, getting hungry.  Objective: Vital signs in last 24 hours: Temp:  [97.4 F (36.3 C)-98.9 F (37.2 C)] 97.4 F (36.3 C) (07/07 1200) Pulse Rate:  [76-100] 84 (07/07 1200) Resp:  [7-23] 9 (07/07 1200) BP: (110-154)/(70-97) 118/88 mmHg (07/07 1200) SpO2:  [94 %-100 %] 100 % (07/07 0900) Weight:  [102.1 kg (225 lb 1.4 oz)] 102.1 kg (225 lb 1.4 oz) (07/06 1730) Weight change:    PE: Alert and oriented, patient on bedside commode exam deferred as some visible anasarca  Lab Results: Results for orders placed or performed during the hospital encounter of 03/29/16 (from the past 24 hour(s))  Glucose, capillary     Status: Abnormal   Collection Time: 04/02/16  3:53 PM  Result Value Ref Range   Glucose-Capillary 155 (H) 65 - 99 mg/dL  CBC with Differential/Platelet     Status: Abnormal   Collection Time: 04/02/16  4:52 PM  Result Value Ref Range   WBC 7.5 4.0 - 10.5 K/uL   RBC 2.91 (L) 4.22 - 5.81 MIL/uL   Hemoglobin 9.1 (L) 13.0 - 17.0 g/dL   HCT 45.426.8 (L) 09.839.0 - 11.952.0 %   MCV 92.1 78.0 - 100.0 fL   MCH 31.3 26.0 - 34.0 pg   MCHC 34.0 30.0 - 36.0 g/dL   RDW 14.714.8 82.911.5 - 56.215.5 %   Platelets 70 (L) 150 - 400 K/uL   Neutrophils Relative % 74 %   Neutro Abs 5.5 1.7 - 7.7 K/uL   Lymphocytes Relative 16 %   Lymphs Abs 1.2 0.7 - 4.0 K/uL   Monocytes Relative 7 %   Monocytes Absolute 0.5 0.1 - 1.0 K/uL   Eosinophils Relative 2 %   Eosinophils Absolute 0.2 0.0 - 0.7 K/uL   Basophils Relative 0 %   Basophils Absolute 0.0 0.0 - 0.1 K/uL  Glucose, capillary     Status: Abnormal   Collection Time: 04/02/16  7:39 PM  Result Value Ref Range   Glucose-Capillary 141 (H) 65 - 99 mg/dL  Glucose, capillary     Status: Abnormal   Collection Time: 04/02/16 11:59 PM  Result Value Ref Range   Glucose-Capillary 147 (H) 65 - 99 mg/dL  Glucose, capillary     Status: Abnormal   Collection Time:  04/03/16  3:57 AM  Result Value Ref Range   Glucose-Capillary 149 (H) 65 - 99 mg/dL  Renal function panel     Status: Abnormal   Collection Time: 04/03/16  5:51 AM  Result Value Ref Range   Sodium 141 135 - 145 mmol/L   Potassium 3.6 3.5 - 5.1 mmol/L   Chloride 110 101 - 111 mmol/L   CO2 26 22 - 32 mmol/L   Glucose, Bld 155 (H) 65 - 99 mg/dL   BUN 29 (H) 6 - 20 mg/dL   Creatinine, Ser 1.302.81 (H) 0.61 - 1.24 mg/dL   Calcium 8.3 (L) 8.9 - 10.3 mg/dL   Phosphorus 3.7 2.5 - 4.6 mg/dL   Albumin 2.1 (L) 3.5 - 5.0 g/dL   GFR calc non Af Amer 22 (L) >60 mL/min   GFR calc Af Amer 25 (L) >60 mL/min   Anion gap 5 5 - 15  Magnesium     Status: Abnormal   Collection Time: 04/03/16  5:51 AM  Result Value Ref Range   Magnesium 1.6 (L) 1.7 - 2.4 mg/dL  CBC with Differential/Platelet     Status: Abnormal  Collection Time: 04/03/16  5:51 AM  Result Value Ref Range   WBC 5.5 4.0 - 10.5 K/uL   RBC 2.74 (L) 4.22 - 5.81 MIL/uL   Hemoglobin 8.4 (L) 13.0 - 17.0 g/dL   HCT 16.125.1 (L) 09.639.0 - 04.552.0 %   MCV 91.6 78.0 - 100.0 fL   MCH 30.7 26.0 - 34.0 pg   MCHC 33.5 30.0 - 36.0 g/dL   RDW 40.914.9 81.111.5 - 91.415.5 %   Platelets 70 (L) 150 - 400 K/uL   Neutrophils Relative % 65 %   Neutro Abs 3.6 1.7 - 7.7 K/uL   Lymphocytes Relative 20 %   Lymphs Abs 1.1 0.7 - 4.0 K/uL   Monocytes Relative 11 %   Monocytes Absolute 0.6 0.1 - 1.0 K/uL   Eosinophils Relative 4 %   Eosinophils Absolute 0.2 0.0 - 0.7 K/uL   Basophils Relative 1 %   Basophils Absolute 0.0 0.0 - 0.1 K/uL  Glucose, capillary     Status: Abnormal   Collection Time: 04/03/16  8:07 AM  Result Value Ref Range   Glucose-Capillary 166 (H) 65 - 99 mg/dL  Glucose, capillary     Status: Abnormal   Collection Time: 04/03/16 12:06 PM  Result Value Ref Range   Glucose-Capillary 132 (H) 65 - 99 mg/dL    Studies/Results: No results found.    Assessment: Alcoholic cirrhosis status post variceal bleed, appears to have stopped Ascites and peripheral  edema secondary to cirrhosis Thrombocytopenia  Plan: Will advance to full liquid diet and further as tolerated Okay to switch to by mouth PPI Would resume spironolactone 100 mg a day and Lasix 40 mg a day. Will sign off for now. Please call if further input needed. Patient will follow-up with his primary gastroenterologist Dr. Guido Sanderoark     Shawnn Bouillon C 04/03/2016, 1:41 PM  Pager [phone] If no answer or after 5 PM call [phone]

## 2016-04-03 NOTE — Progress Notes (Signed)
[facility]

## 2016-04-03 NOTE — Progress Notes (Addendum)
Patient ID: [name] [name], male   DOB: [birth year], 65 y.o.   MRN: [id number]  PROGRESS NOTE    [name] [name]  EXN:[id number] DOB: [birth year] DOA: 03/29/2016  PCP: Terald Sleeper, PA   Brief Narrative:   65 y.o. male with previous medical history of liver cirrhosis, portal hypertension, gastric and esophageal varices (managed by Dr. Gala Romney in [place]). He presented to [facility] ED 07/02 with 4 episodes of hematemesis and while in ED also had 1 large episode of hematochezia.He had EGD in Oct 2016 which showed large esophageal varices with no active bleeding as well as portal gastropathy. He undergoes paracentesis every month, last one was June 26 when he had 7L fluid removed.  On 7/3, he was evaluated by Dr. Amedeo Plenty of GI. He was intubated electively and underwent EGD at which time he had banding of varices x 5. There was apparently steady bleeding even at the end of the procedure; therefore, pt was left intubated and transferred to the ICU for closer observation. On 7/5, he was extubated and has done well since. Care transitioned to [facility] 04/03/2016.   Assessment & Plan:  Acute blood loss anemia / bleeding esophageal varices / anemia of chronic disease - Likely secondary to history of liver cirrhosis, portal hypertension and ascites - Status post endoscopy 03/30/2016 with banding x 5 by Dr. Amedeo Plenty - Octreotide drip stopped, Protonix drip stopped - No further hematemesis  - Hemoglobin 8.4 this morning - GI recommends advancing diet to clear liquids - Patient is hemodynamically stable for transfer to telemetry floor today  History of alcoholic liver cirrhosis, portal hypertension and ascites - Patient requires monthly paracentesis with last paracentesis June 20 9.7 L fluid removed - Continue Protonix 40 mg IV every 12 hours - Continue lactulose 20 g 3 times daily - Continue Rocephin for spontaneous bacterial peritonitis prophylaxis  Thrombocytopenia - Likely bone marrow suppression from liver  cirrhosis - Continue to monitor for bleeding - Daily CBC  Respiratory insufficiency with inability to protect airway due to active hematemesis - S/p intubation 7/3. Extubated 04/01/2016 . - Stable respiratory status  - Chest x-ray 03/31/2016 showed bilateral perihilar and lower lobe airspace opacities again noted, not significantly changed. Suspect layering effusions.   Hypertension / essential hypertension - High potential likely related to acute blood loss anemia secondary to hematemesis - Blood pressure stable  Chronic kidney disease stage IV - Creatinine 3.29 about 2 months ago - Creatinine within baseline range  Dyslipidemia - Continue statin therapy  Hypothyroidism - Continue Synthroid   DVT prophylaxis: SCD's bilaterally  Code Status: partial code Family Communication: wife at the bedside this am Disposition Plan: transfer to telemetry floor today    Consultants:   GI, Dr. Teena Irani   Procedures:   EGD 03/30/2016   Antimicrobials:   Ceftriaxone 03/29/2016 -->   Subjective: No overnight events.   Objective: Filed Vitals:   04/03/16 0354 04/03/16 0400 04/03/16 0800 04/03/16 0900  BP: 128/89 128/89 131/91 127/81  Pulse: 82 77 76 78  Temp: 98.4 F (36.9 C)  97.4 F (36.3 C)   TempSrc: Oral  Oral   Resp: 14 13 7 11   Height:      Weight:      SpO2: 99% 100% 99% 100%    Intake/Output Summary (Last 24 hours) at 04/03/16 1031 Last data filed at 04/03/16 0900  Gross per 24 hour  Intake 2162.08 ml  Output   2400 ml  Net -237.92 ml   [employer]  03/30/16 0007 03/30/16 1235 04/02/16 1730  Weight: 97.8 kg (215 lb 9.8 oz) 97.523 kg (215 lb) 102.1 kg (225 lb 1.4 oz)    Examination:  General exam: Appears calm and comfortable  Respiratory system: Clear to auscultation. Respiratory effort normal. Cardiovascular system: S1 & S2 heard, Rate controlled Gastrointestinal system: Abdomen is nondistended, soft and nontender. No organomegaly or masses felt.  Normal bowel sounds heard. Central nervous system: Alert and oriented. No focal neurological deficits. Extremities: Symmetric 5 x 5 power. Skin: No rashes, lesions or ulcers Psychiatry: Judgement and insight appear normal. Mood & affect appropriate.   Data Reviewed: I have personally reviewed following labs and imaging studies  CBC:  Recent Labs Lab 03/29/16 1920  03/31/16 1856 04/01/16 0805 04/02/16 0330 04/02/16 1652 04/03/16 0551  WBC 3.7*  < > 7.4 8.4 7.0 7.5 5.5  NEUTROABS 2.5  --   --   --   --  5.5 3.6  HGB 9.6*  < > 9.6* 9.3* 9.1* 9.1* 8.4*  HCT 27.9*  < > 30.1* 28.7* 27.2* 26.8* 25.1*  MCV 90.3  < > 93.2 93.5 93.2 92.1 91.6  PLT 58*  < > 71* 83* 68* 70* 70*  < > = values in this interval not displayed. Basic Metabolic Panel:  Recent Labs Lab 03/31/16 0304 04/01/16 0805 04/01/16 1356 04/02/16 0330 04/03/16 0551  NA 139 138 139 139 141  K 3.6 3.7 3.7 3.5 3.6  CL 109 111 112* 108 110  CO2 24 22 20* 24 26  GLUCOSE 73 108* 124* 124* 155*  BUN 34* 32* 32* 30* 29*  CREATININE 3.19* 3.17* 3.14* 2.94* 2.81*  CALCIUM 7.9* 8.0* 8.1* 8.3* 8.3*  MG 1.9  --   --   --  1.6*  PHOS 4.4  --   --   --  3.7   GFR: Estimated Creatinine Clearance: 30.1 mL/min (by C-G formula based on Cr of 2.81). Liver Function Tests:  Recent Labs Lab 03/29/16 1920 03/30/16 0444 04/03/16 0551  AST 35 34  --   ALT 24 22  --   ALKPHOS 144* 125  --   BILITOT 1.5* 1.4*  --   PROT 6.3* 6.3*  --   ALBUMIN 2.8* 2.5* 2.1*   No results for input(s): LIPASE, AMYLASE in the last 168 hours.  Recent Labs Lab 03/29/16 1920  AMMONIA 29   Coagulation Profile:  Recent Labs Lab 03/29/16 1920 03/30/16 2154  INR 1.32 1.40   Cardiac Enzymes:  Recent Labs Lab 03/29/16 1920  TROPONINI <0.03   BNP (last 3 results) No results for input(s): PROBNP in the last 8760 hours. HbA1C: No results for input(s): HGBA1C in the last 72 hours. CBG:  Recent Labs Lab 04/02/16 1553 04/02/16 1939  04/02/16 2359 04/03/16 0357 04/03/16 0807  GLUCAP 155* 141* 147* 149* 166*   Lipid Profile: No results for input(s): CHOL, HDL, LDLCALC, TRIG, CHOLHDL, LDLDIRECT in the last 72 hours. Thyroid Function Tests: No results for input(s): TSH, T4TOTAL, FREET4, T3FREE, THYROIDAB in the last 72 hours. Anemia Panel: No results for input(s): VITAMINB12, FOLATE, FERRITIN, TIBC, IRON, RETICCTPCT in the last 72 hours. Urine analysis:    Component Value Date/Time   COLORURINE YELLOW 01/07/2016 [place] 01/07/2016 1655   LABSPEC 1.010 01/07/2016 1655   PHURINE 5.0 01/07/2016 1655   GLUCOSEU NEGATIVE 01/07/2016 1655   HGBUR SMALL* 01/07/2016 1655   BILIRUBINUR NEGATIVE 01/07/2016 [place] 01/07/2016 1655   PROTEINUR NEGATIVE 01/07/2016 1655  UROBILINOGEN 0.2 05/15/2012 1425   NITRITE NEGATIVE 01/07/2016 1655   LEUKOCYTESUR NEGATIVE 01/07/2016 1655   Sepsis Labs: @LABRCNTIP (procalcitonin:4,lacticidven:4)   MRSA PCR Screening     Status: None   Collection Time: 03/30/16 12:19 AM  Result Value Ref Range Status   MRSA by PCR NEGATIVE NEGATIVE Final  Culture, blood (Routine X 2) w Reflex to ID Panel     Status: None (Preliminary result)   Collection Time: 03/30/16  3:36 PM  Result Value Ref Range Status   Specimen Description BLOOD RIGHT HAND  Final   Special Requests BOTTLES DRAWN AEROBIC ONLY 6CC  Final   Culture NO GROWTH 3 DAYS  Final   Report Status PENDING  Incomplete  Culture, blood (Routine X 2) w Reflex to ID Panel     Status: None (Preliminary result)   Collection Time: 03/30/16  4:21 PM  Result Value Ref Range Status   Specimen Description BLOOD LEFT HAND  Final   Special Requests IN PEDIATRIC BOTTLE 3CC  Final   Culture NO GROWTH 3 DAYS  Final   Report Status PENDING  Incomplete      Radiology Studies:  Portable Chest Xray 03/31/2016  Bilateral perihilar and lower lobe airspace opacities again noted, not significantly changed. Suspect  layering effusions. Electronically Signed   By: Rolm Baptise M.D.   On: 03/31/2016 07:50   Dg Chest Port 1 View 03/30/2016   Endotracheal tube as described without pneumothorax. Patchy infiltrate left base. Lungs elsewhere clear. Aortic atherosclerosis noted. Electronically Signed   By: Lowella Grip III M.D.   On: 03/30/2016 15:20   Dg Chest Portable 1 View 03/29/2016   No active disease. Electronically Signed   By: Lorriane Shire M.D.   On: 03/29/2016 20:16   Dg Abd Portable 1v 03/29/2016   Previous foreign body projecting over the abdomen is no longer seen, was external to the patient. Intra-abdominal ascites. Electronically Signed   By: Jeb Levering M.D.   On: 03/29/2016 21:19     Scheduled Meds: . antiseptic oral rinse  7 mL Mouth Rinse BID  . atorvastatin  40 mg Oral q1800  . cefTRIAXone (ROCEPHIN)  IV  2 g Intravenous Q24H  . chlorhexidine gluconate (SAGE KIT)  15 mL Mouth Rinse BID  . insulin aspart  0-15 Units Subcutaneous Q4H  . lactulose  20 g Oral TID  . levothyroxine  150 mcg Oral QAC breakfast  . pantoprazole  40 mg Intravenous Q12H   Continuous Infusions: . dextrose 5 % and 0.9% NaCl 50 mL/hr at 04/02/16 0830     LOS: 5 days    Time spent: 25 minutes Greater than 50% of the time spent on counseling and coordinating the care.   Leisa Lenz, MD [facility] Pager [phone]  If 7PM-7AM, please contact night-coverage [email or web address] Password [email or web address] 04/03/2016, 10:31 AM

## 2016-04-03 NOTE — Progress Notes (Signed)
Nutrition Follow-up  DOCUMENTATION CODES:   Obesity unspecified  INTERVENTION:  Provide Nepro Shake po BID, each supplement provides 425 kcal and 19 grams protein.  Encourage adequate PO intake.   NUTRITION DIAGNOSIS:   Inadequate oral intake related to inability to eat as evidenced by NPO status; diet advanced; po 50-75%; improving  GOAL:   Patient will meet greater than or equal to 90% of their needs; progressing  MONITOR:   PO intake, Supplement acceptance, Diet advancement, Weight trends, Labs, I & O's  REASON FOR ASSESSMENT:   Ventilator    ASSESSMENT:   65 y.o. Male with PMH as outlined below including cirrhosis, portal HTN, gastric and esophageal varices (managed by Dr. Jena Gaussourk in [place]). He [facility] 07/02 with 4 episodes of hematemesis and while in ED also had 1 large episode of hematochezia.  Pt extubated 7/5.   Pt is currently has a full liquid diet. Meal completion has been 50-75%. Per MD note, pt with no vomiting or regurgitation and is getting hungry. RD to order nutritional supplementation to aid in caloric and protein needs. Noted pt with CKD IV. RD to order Nepro Shake. Will continue to monitor.   Labs and mediations reviewed.   Diet Order:  Diet full liquid Room service appropriate?: Yes; Fluid consistency:: Thin  Skin:  Reviewed, no issues  Last BM:  7/7  Height:   Ht Readings from Last 1 Encounters:  04/03/16 5\' 9"  (1.753 m)    Weight:   Wt Readings from Last 1 Encounters:  04/03/16 231 lb 4.2 oz (104.9 kg)    Ideal Body Weight:  70 kg  BMI:  Body mass index is 34.14 kg/(m^2).  Estimated Nutritional Needs:   Kcal:  2050-2250  Protein:  110-130 grams  Fluid:  Per MD  EDUCATION NEEDS:   No education needs identified at this time  Roslyn SmilingStephanie Kahleb Mcclane, MS, RD, LDN Pager # [phone] After hours/ weekend pager # [phone]

## 2016-04-04 DIAGNOSIS — E785 Hyperlipidemia, unspecified: Secondary | ICD-10-CM

## 2016-04-04 LAB — CBC
HCT: 25.4 % — ABNORMAL LOW (ref 39.0–52.0)
Hemoglobin: 8.4 g/dL — ABNORMAL LOW (ref 13.0–17.0)
MCH: 30.3 pg (ref 26.0–34.0)
MCHC: 33.1 g/dL (ref 30.0–36.0)
MCV: 91.7 fL (ref 78.0–100.0)
PLATELETS: 70 10*3/uL — AB (ref 150–400)
RBC: 2.77 MIL/uL — AB (ref 4.22–5.81)
RDW: 14.5 % (ref 11.5–15.5)
WBC: 3.9 10*3/uL — ABNORMAL LOW (ref 4.0–10.5)

## 2016-04-04 LAB — BASIC METABOLIC PANEL
ANION GAP: 4 — AB (ref 5–15)
BUN: 26 mg/dL — ABNORMAL HIGH (ref 6–20)
CALCIUM: 8.2 mg/dL — AB (ref 8.9–10.3)
CHLORIDE: 109 mmol/L (ref 101–111)
CO2: 26 mmol/L (ref 22–32)
CREATININE: 2.61 mg/dL — AB (ref 0.61–1.24)
GFR calc Af Amer: 28 mL/min — ABNORMAL LOW (ref >60)
GFR calc non Af Amer: 24 mL/min — ABNORMAL LOW (ref >60)
GLUCOSE: 171 mg/dL — AB (ref 65–99)
Potassium: 3.4 mmol/L — ABNORMAL LOW (ref 3.5–5.1)
Sodium: 139 mmol/L (ref 135–145)

## 2016-04-04 LAB — CULTURE, BLOOD (ROUTINE X 2)
CULTURE: NO GROWTH
Culture: NO GROWTH

## 2016-04-04 LAB — GLUCOSE, CAPILLARY
Glucose-Capillary: 144 mg/dL — ABNORMAL HIGH (ref 65–99)
Glucose-Capillary: 150 mg/dL — ABNORMAL HIGH (ref 65–99)

## 2016-04-04 MED ORDER — ATORVASTATIN CALCIUM 40 MG PO TABS: 40.0000 mg | ORAL_TABLET | Freq: Every day | ORAL | Status: DC

## 2016-04-04 MED ORDER — LACTULOSE 10 GM/15ML PO SOLN: 20.0000 g | Freq: Two times a day (BID) | ORAL | Status: DC

## 2016-04-04 MED ORDER — PANTOPRAZOLE SODIUM 40 MG PO TBEC: 40.0000 mg | DELAYED_RELEASE_TABLET | Freq: Every day | ORAL | Status: DC

## 2016-04-04 MED ORDER — NEPRO/CARBSTEADY PO LIQD: 237.0000 mL | Freq: Two times a day (BID) | ORAL | Status: DC

## 2016-04-04 MED ORDER — CHLORHEXIDINE GLUCONATE 0.12 % MT SOLN
OROMUCOSAL | Status: AC
  Filled 2016-04-04: qty 15

## 2016-04-04 MED ORDER — FUROSEMIDE 40 MG PO TABS: 40.0000 mg | ORAL_TABLET | Freq: Every day | ORAL | Status: DC

## 2016-04-04 MED ORDER — ALBUTEROL SULFATE HFA 108 (90 BASE) MCG/ACT IN AERS: 2.0000 | INHALATION_SPRAY | Freq: Four times a day (QID) | RESPIRATORY_TRACT | Status: DC | PRN

## 2016-04-04 NOTE — Discharge Summary (Signed)
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 male with previous medical history of liver cirrhosis, portal hypertension, gastric and esophageal varices (managed by Dr. Jena Gauss in [place]). He presented to [facility] ED 07/02 with 4 episodes of hematemesis and while in ED also had 1 large episode of hematochezia.He had EGD in Oct 2016 which showed large esophageal varices with no active bleeding as well as portal gastropathy. He undergoes paracentesis every month, last one was June 26 when he had 7L fluid removed.  On 7/3, he was evaluated by Dr. Madilyn Fireman of GI. He was intubated electively and underwent EGD at which time he had banding of varices x 5. There was apparently steady bleeding even at the end of the procedure; therefore, pt was left intubated and transferred to the ICU for closer observation. On 7/5, he was extubated and has done well since. Care transitioned to [facility] 04/03/2016.  Hospital Course:    Assessment & Plan:  Acute blood loss anemia / bleeding esophageal varices / anemia of chronic disease - Likely secondary to history of liver cirrhosis, portal hypertension and ascites - Status post endoscopy 03/30/2016 with banding x 5 by Dr. Madilyn Fireman - Octreotide drip stopped, Protonix  drip stopped - No further hematemesis  - Hemoglobin 8.4 this morning - GI recommends advancing diet, signed off 7/7  History of alcoholic liver cirrhosis, portal hypertension and ascites - Patient requires monthly paracentesis with last paracentesis June 20 9.7 L fluid removed - Was on Protonix 40 mg IV every 12 hours; continue PPI therapy on discharge  - Continue lactulose 20 g 2 times daily on discharge  - Was on Rocephin for spontaneous bacterial peritonitis prophylaxis. No need for abx on discharge.  Thrombocytopenia - Likely bone marrow suppression from liver cirrhosis - Platelets 70, stable overall   Respiratory insufficiency with inability to protect airway due to active hematemesis - Chest x-ray 03/31/2016 showed bilateral perihilar and lower lobe airspace opacities again noted, not significantly changed. Suspect layering effusions.  - S/p intubation 7/3. Extubated 04/01/2016 . - Stable respiratory status   Hypertension / essential hypertension - High potential likely related to acute blood loss anemia secondary to hematemesis - Blood pressure stable  Chronic kidney disease stage IV - Creatinine 3.29 about 2 months ago - Creatinine within baseline range  Dyslipidemia - Continue statin therapy  Hypothyroidism - Continue Synthroid   DVT prophylaxis: SCD's bilaterally  Code Status: partial code Family Communication: wife at the bedside this am    Consultants:   GI, Dr. Dorena Cookey  Procedures:   EGD 03/30/2016  Antimicrobials:   Ceftriaxone 03/29/2016 --> 04/04/2016  Signed:  Manson Passey, MD  [facility] 04/04/2016, 1:30 PM  Pager #: [phone]  Time spent in minutes: less than 30 minutes    Discharge Exam: Filed Vitals:  04/03/16 2030 04/04/16 0836  BP: 106/57 131/68  Pulse: 80 78  Temp: 98.5 F (36.9 C) 97.5 F (36.4 C)  Resp: 17 18   Filed Vitals:   04/03/16 1200 04/03/16 1352 04/03/16 2030 04/04/16 0836  BP: 118/88 145/78  106/57 131/68  Pulse: 84 85 80 78  Temp: 97.4 F (36.3 C) 97.9 F (36.6 C) 98.5 F (36.9 C) 97.5 F (36.4 C)  TempSrc: Oral Oral Oral Oral  Resp: Height:   (1.753 m)    Weight:  104.9 kg (231 lb 4.2 oz) 104.6 kg (230 lb 9.6 oz)   SpO2:  100% 100% 100%    General: Pt is alert, follows commands appropriately, not in acute distress Cardiovascular: Regular rate and rhythm, S1/S2 +, no murmurs Respiratory: Clear to auscultation bilaterally, no wheezing, no crackles, no rhonchi Abdominal: Soft, non tender, non distended, bowel sounds +, no guarding Extremities: no edema, no cyanosis, pulses palpable bilaterally DP and PT Neuro: Grossly nonfocal  Discharge Instructions  Discharge Instructions    Call MD for:  difficulty breathing, headache or visual disturbances    Complete by:  As directed      Call MD for:  persistant dizziness or light-headedness    Complete by:  As directed      Call MD for:  persistant nausea and vomiting    Complete by:  As directed      Call MD for:  severe uncontrolled pain    Complete by:  As directed      Diet - low sodium heart healthy    Complete by:  As directed      Discharge instructions    Complete by:  As directed   Continue lasix and spironolactone on discharge Continue protonix as well     Increase activity slowly    Complete by:  As directed             Medication List    STOP taking these medications        omeprazole 20 MG capsule  Commonly known as:  PRILOSEC     simvastatin 80 MG tablet  Commonly known as:  ZOCOR     torsemide 20 MG tablet  Commonly known as:  DEMADEX      TAKE these medications        albuterol 108 (90 Base) MCG/ACT inhaler  Commonly known as:  PROVENTIL HFA;VENTOLIN HFA  Inhale 2 puffs into the lungs every 6 (six) hours as needed for wheezing or shortness of breath.     ALPRAZolam 1 MG tablet  Commonly known as:  XANAX  Take 1 mg by mouth 3 (three) times daily as needed for anxiety  or sleep.     atorvastatin 40 MG tablet  Commonly known as:  LIPITOR  Take 1 tablet (40 mg total) by mouth daily at 6 PM.     cholecalciferol 1000 units tablet  Commonly known as:  VITAMIN D  Take 1,000 Units by mouth daily.     feeding supplement (NEPRO CARB STEADY) Liqd  Take 237 mLs by mouth 2 (two) times daily between meals.     furosemide 40 MG tablet  Commonly known as:  LASIX  Take 1 tablet (40 mg total) by mouth daily.     gabapentin 800 MG tablet  Commonly known as:  NEURONTIN  Take 800 mg by mouth 2 (two) times daily.     HUMALOG KWIKPEN 100 UNIT/ML KiwkPen  Generic drug:  insulin lispro  Inject 12 Units into the skin 3 (three) times daily with meals.     insulin glargine 100 unit/mL Sopn  Commonly known as:  LANTUS  Inject 5 Units into the skin at bedtime.     lactulose 10 GM/15ML solution  Commonly known as:  CHRONULAC  Take 30 mLs (20 g total) by mouth 2 (two) times daily.     levothyroxine 150 MCG tablet  Commonly known as:  SYNTHROID, LEVOTHROID  Take 150 mcg by mouth daily before breakfast.     MAGNESIUM-OXIDE 400 (241.3 Mg) MG tablet  Generic drug:  magnesium oxide  Take 400 mg by mouth at bedtime.     oxyCODONE 5 MG immediate release tablet  Commonly known as:  Oxy IR/ROXICODONE  Take 1 tablet (5 mg total) by mouth every 4 (four) hours as needed for moderate pain.     pantoprazole 40 MG tablet  Commonly known as:  PROTONIX  Take 1 tablet (40 mg total) by mouth daily.     rOPINIRole 0.5 MG tablet  Commonly known as:  REQUIP  Take 0.5 mg by mouth at bedtime.     spironolactone 25 MG tablet  Commonly known as:  ALDACTONE  Take 25 mg by mouth daily.     tamsulosin 0.4 MG Caps capsule  Commonly known as:  FLOMAX  Take 0.4 mg by mouth daily after supper.     tiZANidine 4 MG tablet  Commonly known as:  ZANAFLEX  Take 4 mg by mouth at bedtime.           Follow-up Information    Follow up with [name], PA. Schedule an appointment  as soon as possible for a visit in 1 week.   Specialty:  Physician Assistant   Why:  Follow up appt after recent hospitalization   Contact information:   [address] [place] [address] [phone]        The results of significant diagnostics from this hospitalization (including imaging, microbiology, ancillary and laboratory) are listed below for reference.    Significant Diagnostic Studies: Dg Abd 1 View  03/29/2016  CLINICAL DATA:  Abdominal pain.  Hematemesis. EXAM: ABDOMEN - 1 VIEW COMPARISON:  01/07/2016 FINDINGS: There is a 7 cm long no foreign body overlying the mid abdomen. The appearance is suggestive of an ink pen spring. The abdomen is distended with increased density consistent with diffuse ascites. Minimal air in the nondistended bowel. Bones are normal. IMPRESSION: Extensive ascites. Possible foreign body in the abdomen as described. Electronically Signed   By: Francene Boyers M.D.   On: 03/29/2016 20:22   US Paracentesis  03/23/2016  INDICATION: Cirrhosis, ascites EXAM: ULTRASOUND GUIDED THERAPEUTIC PARACENTESIS MEDICATIONS: None. COMPLICATIONS: None immediate. PROCEDURE: Procedure, benefits, and risks of procedure were discussed with patient. Written informed consent for procedure was obtained. Time out protocol followed. Adequate collection of ascites localized by ultrasound in LEFT lower quadrant. Skin prepped and draped in usual sterile fashion. Skin and soft tissues anesthetized with 10 mL of 1% lidocaine. 5 [place] Yueh catheter placed into peritoneal cavity. 7 L of yellow ascitic fluid aspirated by vacuum bottle suction. Procedure tolerated well by patient without immediate complication. FINDINGS: As above IMPRESSION: Successful ultrasound-guided paracentesis yielding 7 liters of peritoneal fluid. Electronically Signed   By: Ulyses Southward M.D.   On: 03/23/2016 16:20   Portable Chest Xray  03/31/2016  CLINICAL DATA:  Respiratory failure EXAM: PORTABLE CHEST 1 VIEW COMPARISON:   03/30/2016 FINDINGS: Endotracheal tube  is in stable position. Bilateral airspace opacities are again noted, most pronounced in the lung bases. Suspect small effusions. Heart is borderline in size. IMPRESSION: Bilateral perihilar and lower lobe airspace opacities again noted, not significantly changed. Suspect layering effusions. Electronically Signed   By: Charlett NoseKevin  Dover M.D.   On: 03/31/2016 07:50   Dg Chest Port 1 View  03/30/2016  CLINICAL DATA:  Hypoxia EXAM: PORTABLE CHEST 1 VIEW COMPARISON:  March 29, 2016 FINDINGS: Endotracheal tube tip is 1.7 cm above the carina. No pneumothorax. There is patchy consolidation in the left lung base. Lungs elsewhere clear. Heart size and pulmonary vascularity are within within normal limits. There is atherosclerotic calcification in the aorta. No bone lesions evident. No adenopathy. IMPRESSION: Endotracheal tube as described without pneumothorax. Patchy infiltrate left base. Lungs elsewhere clear. Aortic atherosclerosis noted. Electronically Signed   By: Bretta BangWilliam  Woodruff III M.D.   On: 03/30/2016 15:20   Dg Chest Portable 1 View  03/29/2016  CLINICAL DATA:  Shortness of breath. Hematemesis. Abdominal pain. Cirrhosis. EXAM: PORTABLE CHEST 1 VIEW COMPARISON:  01/07/2016, 10/08/2015 and 03/25/2015 FINDINGS: Heart size and pulmonary vascularity are normal. The lungs are clear. No acute bone abnormality. IMPRESSION: No active disease. Electronically Signed   By: Francene BoyersJames  Maxwell M.D.   On: 03/29/2016 20:16   Dg Abd Portable 1v  03/29/2016  CLINICAL DATA:  Possible foreign body. Foreign body in soft tissues. EXAM: PORTABLE ABDOMEN - 1 VIEW COMPARISON:  Abdominal radiograph earlier this day at 1954 hour. IV catheter glass tubing removed from beneath the patient in the interim. FINDINGS: The previous foreign body is no longer seen, with likely external. Hazy opacity throughout the abdomen consistent with ascites. No dilated bowel loops. IMPRESSION: Previous foreign body projecting  over the abdomen is no longer seen, was external to the patient. Intra-abdominal ascites. Electronically Signed   By: Rubye OaksMelanie  Ehinger M.D.   On: 03/29/2016 21:19    Microbiology: Recent Results (from the past 240 hour(s))  MRSA PCR Screening     Status: None   Collection Time: 03/30/16 12:19 AM  Result Value Ref Range Status   MRSA by PCR NEGATIVE NEGATIVE Final    Comment:        The GeneXpert MRSA Assay (FDA approved for NASAL specimens only), is one component of a comprehensive MRSA colonization surveillance program. It is not intended to diagnose MRSA infection nor to guide or monitor treatment for MRSA infections.   Culture, blood (Routine X 2) w Reflex to ID Panel     Status: None   Collection Time: 03/30/16  3:36 PM  Result Value Ref Range Status   Specimen Description BLOOD RIGHT HAND  Final   Special Requests BOTTLES DRAWN AEROBIC ONLY 6CC  Final   Culture NO GROWTH 5 DAYS  Final   Report Status 04/04/2016 FINAL  Final  Culture, blood (Routine X 2) w Reflex to ID Panel     Status: None   Collection Time: 03/30/16  4:21 PM  Result Value Ref Range Status   Specimen Description BLOOD LEFT HAND  Final   Special Requests IN PEDIATRIC BOTTLE 3CC  Final   Culture NO GROWTH 5 DAYS  Final   Report Status 04/04/2016 FINAL  Final     Labs: Basic Metabolic Panel:  Recent Labs Lab 03/31/16 0304 04/01/16 0805 04/01/16 1356 04/02/16 0330 04/03/16 0551 04/04/16 1110  NA 139 138 139 139 141 139  K 3.6 3.7 3.7 3.5 3.6 3.4*  CL 109 111 112* 108  110 109  CO2 24 22 20* 24 26 26   GLUCOSE 73 108* 124* 124* 155* 171*  BUN 34* 32* 32* 30* 29* 26*  CREATININE 3.19* 3.17* 3.14* 2.94* 2.81* 2.61*  CALCIUM 7.9* 8.0* 8.1* 8.3* 8.3* 8.2*  MG 1.9  --   --   --  1.6*  --   PHOS 4.4  --   --   --  3.7  --    Liver Function Tests:  Recent Labs Lab 03/29/16 1920 03/30/16 0444 04/03/16 0551  AST 35 34  --   ALT 24 22  --   ALKPHOS 144* 125  --   BILITOT 1.5* 1.4*  --   PROT  6.3* 6.3*  --   ALBUMIN 2.8* 2.5* 2.1*   No results for input(s): LIPASE, AMYLASE in the last 168 hours.  Recent Labs Lab 03/29/16 1920  AMMONIA 29   CBC:  Recent Labs Lab 03/29/16 1920  04/01/16 0805 04/02/16 0330 04/02/16 1652 04/03/16 0551 04/04/16 1110  WBC 3.7*  < > 8.4 7.0 7.5 5.5 3.9*  NEUTROABS 2.5  --   --   --  5.5 3.6  --   HGB 9.6*  < > 9.3* 9.1* 9.1* 8.4* 8.4*  HCT 27.9*  < > 28.7* 27.2* 26.8* 25.1* 25.4*  MCV 90.3  < > 93.5 93.2 92.1 91.6 91.7  PLT 58*  < > 83* 68* 70* 70* 70*  < > = values in this interval not displayed. Cardiac Enzymes:  Recent Labs Lab 03/29/16 1920  TROPONINI <0.03   BNP: BNP (last 3 results)  Recent Labs  01/07/16 1453  BNP 94.0    ProBNP (last 3 results) No results for input(s): PROBNP in the last 8760 hours.  CBG:  Recent Labs Lab 04/03/16 1206 04/03/16 1708 04/03/16 2028 04/04/16 0745 04/04/16 1226  GLUCAP 132* 151* 119* 144* 150*

## 2016-04-04 NOTE — Progress Notes (Signed)
Patient discharged home per MD. NSL removed x 2. Discharge instructions provided. Scripts provided. Bess KindsGWALTNEY, Jilleen Essner B, RN

## 2016-04-04 NOTE — Discharge Instructions (Signed)

## 2016-04-06 NOTE — Care Management Note (Signed)
Case Management Note  Patient Details  Name: [name] [name] MRN: [id number] Date of Birth: 02-12-1951  Subjective/Objective:       Patient presented to [facility] ED with complaints of hematemesis              Action/Plan: CM met with patient and spouse concerning recom mendations for [facility] services. Patient and spouse are agreeable. Offered choice [facility] selected. Verified patient's contact inf-Tiffany Liaison from [facility] contacted to confirm [facility] services. No further questions or concerns verbalized. Explained that a nurse from [facility] will contact patient at the verified number 24-48 hours post discharge, patient verbalized understanding No additional CM needs identified  Expected Discharge Date:    04/04/16              Expected Discharge Plan:  [place]  In-House Referral:     Discharge planning Services  CM Consult  Post Acute Care Choice:    Choice offered to:  Patient, Spouse  DME Arranged:    DME Agency:     HH Arranged:  RN, PT, OT [facility] Agency:   [facility]  Status of Service:  Completed, signed off  If discussed at [place] of Stay Meetings, dates discussed:    Additional CommentsLaurena Slimmer, RN 04/06/2016, 8:40 PM

## 2016-04-09 ENCOUNTER — Telehealth: Payer: Self-pay | Admitting: Internal Medicine

## 2016-04-09 NOTE — Telephone Encounter (Signed)
PATIENT SPOUSE CALLED STATING HE NEEDS TO HAVE FLUID TAKEN OFF.  PLEASE CALL [phone]

## 2016-04-09 NOTE — Telephone Encounter (Signed)
Trid to call with no answer 

## 2016-04-10 ENCOUNTER — Ambulatory Visit (HOSPITAL_COMMUNITY)
Admission: RE | Admit: 2016-04-10 | Discharge: 2016-04-10 | Disposition: A | Discharge status: Home / Self Care | Payer: Medicare Other | Source: Ambulatory Visit | Attending: Gastroenterology | Admitting: Gastroenterology

## 2016-04-10 ENCOUNTER — Encounter (HOSPITAL_COMMUNITY): Payer: Self-pay

## 2016-04-10 ENCOUNTER — Other Ambulatory Visit: Payer: Self-pay

## 2016-04-10 DIAGNOSIS — K7460 Unspecified cirrhosis of liver: Secondary | ICD-10-CM | POA: Diagnosis not present

## 2016-04-10 DIAGNOSIS — R188 Other ascites: Secondary | ICD-10-CM | POA: Insufficient documentation

## 2016-04-10 LAB — GRAM STAIN

## 2016-04-10 LAB — BODY FLUID CELL COUNT WITH DIFFERENTIAL
Eos, Fluid: 0 %
LYMPHS FL: 48 %
MONOCYTE-MACROPHAGE-SEROUS FLUID: 52 % (ref 50–90)
Neutrophil Count, Fluid: 0 % (ref 0–25)
WBC FLUID: 101 uL (ref 0–1000)

## 2016-04-10 MED ORDER — ALBUMIN HUMAN 25 % IV SOLN
50.0000 g | Freq: Once | INTRAVENOUS | Status: AC
  Administered 2016-04-10: 50 g via INTRAVENOUS

## 2016-04-10 MED ORDER — ALBUMIN HUMAN 25 % IV SOLN
INTRAVENOUS | Status: AC
  Administered 2016-04-10: 50 g via INTRAVENOUS
  Filled 2016-04-10: qty 200

## 2016-04-10 NOTE — Procedures (Signed)
PreOperative Dx: Cirrhosis, ascites Postoperative Dx: Cirrhosis, ascites Procedure:   US guided paracentesis Radiologist:  Carston Riedl Anesthesia:  10 ml of1% lidocaine Specimen:  9.8 L of yellow ascitic fluid EBL:   < 1 ml Complications: None  

## 2016-04-10 NOTE — Progress Notes (Signed)
Paracentesis complete no signs of distress. 9800 ml yellow colored ascites removed.

## 2016-04-10 NOTE — Telephone Encounter (Signed)
Done he is there this morning

## 2016-04-13 LAB — PATHOLOGIST SMEAR REVIEW

## 2016-04-13 NOTE — Progress Notes (Signed)
Quick Note:  Please let patient know that there were a few atypical cells from his last fluid analysis.   Pathologist wants a specimen for cytology. Let's make sure cytology is done with his next lvap. (this is contrary to what I have been asking but now it is needed). Please make this clear to radiology (it is not a part of our standing order on him). ______

## 2016-04-15 LAB — CULTURE, BODY FLUID-BOTTLE

## 2016-04-15 LAB — CULTURE, BODY FLUID W GRAM STAIN -BOTTLE: Culture: NO GROWTH

## 2016-04-20 ENCOUNTER — Telehealth: Payer: Self-pay

## 2016-04-20 NOTE — Telephone Encounter (Signed)
Pt's granddaughter, [name], called to say pt needs to be scheduled for a PARA. She called Radiology and they said she had to call here to get it scheduled. She is aware Ginger is off today and will give her a call tomorrow morning.

## 2016-04-20 NOTE — Progress Notes (Signed)
Cytology did get done and benign findings.

## 2016-04-21 ENCOUNTER — Other Ambulatory Visit: Payer: Self-pay

## 2016-04-21 DIAGNOSIS — R188 Other ascites: Secondary | ICD-10-CM

## 2016-04-21 NOTE — Telephone Encounter (Signed)
Pt is set up for PARA tomorrow at 12:45. He is aware

## 2016-04-22 ENCOUNTER — Ambulatory Visit (HOSPITAL_COMMUNITY)
Admission: RE | Admit: 2016-04-22 | Discharge: 2016-04-22 | Disposition: A | Discharge status: Home / Self Care | Payer: Medicare Other | Source: Ambulatory Visit | Attending: Gastroenterology | Admitting: Gastroenterology

## 2016-04-22 ENCOUNTER — Encounter (HOSPITAL_COMMUNITY): Payer: Self-pay

## 2016-04-22 DIAGNOSIS — R188 Other ascites: Secondary | ICD-10-CM | POA: Insufficient documentation

## 2016-04-22 LAB — BODY FLUID CELL COUNT WITH DIFFERENTIAL
Eos, Fluid: 0 %
Lymphs, Fluid: 80 %
Monocyte-Macrophage-Serous Fluid: 19 % — ABNORMAL LOW (ref 50–90)
Neutrophil Count, Fluid: 1 % (ref 0–25)
Total Nucleated Cell Count, Fluid: 80 cu mm (ref 0–1000)

## 2016-04-22 LAB — GRAM STAIN

## 2016-04-22 MED ORDER — ALBUMIN HUMAN 25 % IV SOLN
INTRAVENOUS | Status: AC
  Administered 2016-04-22: 50 g via INTRAVENOUS
  Filled 2016-04-22: qty 200

## 2016-04-22 MED ORDER — ALBUMIN HUMAN 25 % IV SOLN
50.0000 g | Freq: Once | INTRAVENOUS | Status: AC
  Administered 2016-04-22: 50 g via INTRAVENOUS

## 2016-04-22 NOTE — Progress Notes (Signed)
Paracentesis complete no signs of distress. 11600 ml yellow colored ascites removed.

## 2016-04-25 NOTE — Progress Notes (Signed)
Cell count/cytology unremarkable.

## 2016-04-28 LAB — CULTURE, BODY FLUID-BOTTLE: CULTURE: NO GROWTH

## 2016-04-28 LAB — CULTURE, BODY FLUID W GRAM STAIN -BOTTLE

## 2016-04-30 ENCOUNTER — Telehealth: Payer: Self-pay | Admitting: Internal Medicine

## 2016-04-30 NOTE — Telephone Encounter (Signed)
Recall for abdominal ultrasound 

## 2016-05-01 NOTE — Telephone Encounter (Signed)
Letter mailed

## 2016-05-05 ENCOUNTER — Emergency Department (HOSPITAL_COMMUNITY): Payer: Medicare Other

## 2016-05-05 ENCOUNTER — Inpatient Hospital Stay (HOSPITAL_COMMUNITY)
Admission: EM | Admit: 2016-05-05 | Discharge: 2016-05-07 | DRG: 442 | Disposition: A | Discharge status: Home / Self Care | Payer: Medicare Other | Attending: Internal Medicine | Admitting: Internal Medicine

## 2016-05-05 ENCOUNTER — Encounter (HOSPITAL_COMMUNITY): Payer: Self-pay

## 2016-05-05 DIAGNOSIS — E7800 Pure hypercholesterolemia, unspecified: Secondary | ICD-10-CM | POA: Diagnosis present

## 2016-05-05 DIAGNOSIS — K7682 Hepatic encephalopathy: Secondary | ICD-10-CM | POA: Diagnosis present

## 2016-05-05 DIAGNOSIS — D638 Anemia in other chronic diseases classified elsewhere: Secondary | ICD-10-CM | POA: Diagnosis present

## 2016-05-05 DIAGNOSIS — Z823 Family history of stroke: Secondary | ICD-10-CM

## 2016-05-05 DIAGNOSIS — D696 Thrombocytopenia, unspecified: Secondary | ICD-10-CM

## 2016-05-05 DIAGNOSIS — K3189 Other diseases of stomach and duodenum: Secondary | ICD-10-CM | POA: Diagnosis present

## 2016-05-05 DIAGNOSIS — Z87891 Personal history of nicotine dependence: Secondary | ICD-10-CM | POA: Diagnosis not present

## 2016-05-05 DIAGNOSIS — N189 Chronic kidney disease, unspecified: Secondary | ICD-10-CM

## 2016-05-05 DIAGNOSIS — K7290 Hepatic failure, unspecified without coma: Principal | ICD-10-CM | POA: Diagnosis present

## 2016-05-05 DIAGNOSIS — Z9114 Patient's other noncompliance with medication regimen: Secondary | ICD-10-CM | POA: Diagnosis not present

## 2016-05-05 DIAGNOSIS — D649 Anemia, unspecified: Secondary | ICD-10-CM

## 2016-05-05 DIAGNOSIS — K766 Portal hypertension: Secondary | ICD-10-CM | POA: Diagnosis present

## 2016-05-05 DIAGNOSIS — K7460 Unspecified cirrhosis of liver: Secondary | ICD-10-CM | POA: Diagnosis present

## 2016-05-05 DIAGNOSIS — K7031 Alcoholic cirrhosis of liver with ascites: Secondary | ICD-10-CM | POA: Diagnosis present

## 2016-05-05 DIAGNOSIS — I120 Hypertensive chronic kidney disease with stage 5 chronic kidney disease or end stage renal disease: Secondary | ICD-10-CM | POA: Diagnosis present

## 2016-05-05 DIAGNOSIS — E1122 Type 2 diabetes mellitus with diabetic chronic kidney disease: Secondary | ICD-10-CM | POA: Diagnosis present

## 2016-05-05 DIAGNOSIS — E860 Dehydration: Secondary | ICD-10-CM | POA: Diagnosis present

## 2016-05-05 DIAGNOSIS — R410 Disorientation, unspecified: Secondary | ICD-10-CM | POA: Diagnosis present

## 2016-05-05 DIAGNOSIS — R188 Other ascites: Secondary | ICD-10-CM

## 2016-05-05 DIAGNOSIS — N179 Acute kidney failure, unspecified: Secondary | ICD-10-CM | POA: Diagnosis present

## 2016-05-05 DIAGNOSIS — N289 Disorder of kidney and ureter, unspecified: Secondary | ICD-10-CM

## 2016-05-05 DIAGNOSIS — J449 Chronic obstructive pulmonary disease, unspecified: Secondary | ICD-10-CM | POA: Diagnosis present

## 2016-05-05 DIAGNOSIS — Z825 Family history of asthma and other chronic lower respiratory diseases: Secondary | ICD-10-CM | POA: Diagnosis not present

## 2016-05-05 DIAGNOSIS — Z21 Asymptomatic human immunodeficiency virus [HIV] infection status: Secondary | ICD-10-CM | POA: Diagnosis present

## 2016-05-05 DIAGNOSIS — E1165 Type 2 diabetes mellitus with hyperglycemia: Secondary | ICD-10-CM | POA: Diagnosis present

## 2016-05-05 DIAGNOSIS — N400 Enlarged prostate without lower urinary tract symptoms: Secondary | ICD-10-CM | POA: Diagnosis present

## 2016-05-05 DIAGNOSIS — E039 Hypothyroidism, unspecified: Secondary | ICD-10-CM | POA: Diagnosis present

## 2016-05-05 DIAGNOSIS — Z66 Do not resuscitate: Secondary | ICD-10-CM | POA: Diagnosis present

## 2016-05-05 DIAGNOSIS — Z833 Family history of diabetes mellitus: Secondary | ICD-10-CM

## 2016-05-05 DIAGNOSIS — N185 Chronic kidney disease, stage 5: Secondary | ICD-10-CM | POA: Diagnosis present

## 2016-05-05 DIAGNOSIS — K219 Gastro-esophageal reflux disease without esophagitis: Secondary | ICD-10-CM | POA: Diagnosis present

## 2016-05-05 DIAGNOSIS — IMO0002 Reserved for concepts with insufficient information to code with codable children: Secondary | ICD-10-CM | POA: Diagnosis present

## 2016-05-05 LAB — COMPREHENSIVE METABOLIC PANEL
ALK PHOS: 125 U/L (ref 38–126)
ALT: 26 U/L (ref 17–63)
ANION GAP: 9 (ref 5–15)
AST: 43 U/L — ABNORMAL HIGH (ref 15–41)
Albumin: 2.9 g/dL — ABNORMAL LOW (ref 3.5–5.0)
BILIRUBIN TOTAL: 2.3 mg/dL — AB (ref 0.3–1.2)
BUN: 69 mg/dL — ABNORMAL HIGH (ref 6–20)
CALCIUM: 8.3 mg/dL — AB (ref 8.9–10.3)
CO2: 19 mmol/L — AB (ref 22–32)
Chloride: 106 mmol/L (ref 101–111)
Creatinine, Ser: 5.75 mg/dL — ABNORMAL HIGH (ref 0.61–1.24)
GFR, EST AFRICAN AMERICAN: 11 mL/min — AB (ref >60)
GFR, EST NON AFRICAN AMERICAN: 9 mL/min — AB (ref >60)
Glucose, Bld: 113 mg/dL — ABNORMAL HIGH (ref 65–99)
Potassium: 3.8 mmol/L (ref 3.5–5.1)
SODIUM: 134 mmol/L — AB (ref 135–145)
TOTAL PROTEIN: 6.4 g/dL — AB (ref 6.5–8.1)

## 2016-05-05 LAB — CBC WITH DIFFERENTIAL/PLATELET
BASOS ABS: 0 10*3/uL (ref 0.0–0.1)
Basophils Relative: 0 %
EOS PCT: 4 %
Eosinophils Absolute: 0.3 10*3/uL (ref 0.0–0.7)
HCT: 26.1 % — ABNORMAL LOW (ref 39.0–52.0)
HEMOGLOBIN: 9.4 g/dL — AB (ref 13.0–17.0)
LYMPHS PCT: 20 %
Lymphs Abs: 1.4 10*3/uL (ref 0.7–4.0)
MCH: 32.5 pg (ref 26.0–34.0)
MCHC: 36 g/dL (ref 30.0–36.0)
MCV: 90.3 fL (ref 78.0–100.0)
MONO ABS: 0.8 10*3/uL (ref 0.1–1.0)
MONOS PCT: 10 %
Neutro Abs: 4.9 10*3/uL (ref 1.7–7.7)
Neutrophils Relative %: 66 %
Platelets: 64 10*3/uL — ABNORMAL LOW (ref 150–400)
RBC: 2.89 MIL/uL — ABNORMAL LOW (ref 4.22–5.81)
RDW: 16.1 % — ABNORMAL HIGH (ref 11.5–15.5)
Smear Review: DECREASED
WBC: 7.4 10*3/uL (ref 4.0–10.5)

## 2016-05-05 LAB — PROTIME-INR
INR: 1.4
PROTHROMBIN TIME: 17.3 s — AB (ref 11.4–15.2)

## 2016-05-05 LAB — POC OCCULT BLOOD, ED: Fecal Occult Bld: NEGATIVE

## 2016-05-05 LAB — LACTIC ACID, PLASMA: LACTIC ACID, VENOUS: 1.4 mmol/L (ref 0.5–1.9)

## 2016-05-05 MED ORDER — SODIUM CHLORIDE 0.9 % IV BOLUS (SEPSIS)
500.0000 mL | Freq: Once | INTRAVENOUS | Status: AC
  Administered 2016-05-05: 500 mL via INTRAVENOUS

## 2016-05-05 MED ORDER — LACTULOSE 10 GM/15ML PO SOLN
30.0000 g | Freq: Once | ORAL | Status: AC
  Administered 2016-05-05: 30 g via ORAL
  Filled 2016-05-05: qty 60

## 2016-05-05 NOTE — ED Triage Notes (Signed)
Pt has history of cirrhosis of the liver- family reports increased confusion that started last night, accompanied by sleeping and lethargy.

## 2016-05-05 NOTE — Progress Notes (Signed)
ABG done at 20:52. Unable to enter results in [facility], waiting for IT to call me back.  Results are as follows and were called back to Karle BarrKristi Belton RN at 21:05:  PH: [phone] PCO2: 29.2 PO2: 93.9 HCO3: 22.7 O2 sat: 97.2%
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
## 2016-05-05 NOTE — ED Notes (Signed)
Pt back to room from CT

## 2016-05-05 NOTE — ED Notes (Signed)
Pt transported to CT ?

## 2016-05-06 ENCOUNTER — Inpatient Hospital Stay (HOSPITAL_COMMUNITY): Payer: Medicare Other

## 2016-05-06 ENCOUNTER — Encounter (HOSPITAL_COMMUNITY): Payer: Self-pay | Admitting: *Deleted

## 2016-05-06 DIAGNOSIS — K7031 Alcoholic cirrhosis of liver with ascites: Secondary | ICD-10-CM

## 2016-05-06 DIAGNOSIS — Z21 Asymptomatic human immunodeficiency virus [HIV] infection status: Secondary | ICD-10-CM

## 2016-05-06 DIAGNOSIS — K7290 Hepatic failure, unspecified without coma: Principal | ICD-10-CM

## 2016-05-06 DIAGNOSIS — N189 Chronic kidney disease, unspecified: Secondary | ICD-10-CM

## 2016-05-06 DIAGNOSIS — N179 Acute kidney failure, unspecified: Secondary | ICD-10-CM

## 2016-05-06 DIAGNOSIS — K3189 Other diseases of stomach and duodenum: Secondary | ICD-10-CM

## 2016-05-06 LAB — BLOOD GAS, ARTERIAL
ACID-BASE DEFICIT: 2.9 mmol/L — AB (ref 0.0–2.0)
Allens test (pass/fail): POSITIVE — AB
Bicarbonate: 22.7 mEq/L (ref 20.0–24.0)
DRAWN BY: 382351
FIO2: 0.21
O2 SAT: 97.2 %
PATIENT TEMPERATURE: 37
PCO2 ART: 29.2 mmHg — AB (ref 35.0–45.0)
pH, Arterial: 7.457 — ABNORMAL HIGH (ref 7.350–7.450)
pO2, Arterial: 93.9 mmHg (ref 80.0–100.0)

## 2016-05-06 LAB — AMMONIA: AMMONIA: 78 umol/L — AB (ref 9–35)

## 2016-05-06 LAB — ETHANOL

## 2016-05-06 MED ORDER — FUROSEMIDE 40 MG PO TABS
40.0000 mg | ORAL_TABLET | Freq: Every day | ORAL | Status: DC
  Administered 2016-05-06 – 2016-05-07 (×2): 40 mg via ORAL
  Filled 2016-05-06 (×2): qty 1

## 2016-05-06 MED ORDER — SPIRONOLACTONE 25 MG PO TABS
50.0000 mg | ORAL_TABLET | Freq: Every day | ORAL | Status: DC
  Administered 2016-05-06 – 2016-05-07 (×2): 50 mg via ORAL
  Filled 2016-05-06 (×2): qty 2

## 2016-05-06 MED ORDER — LACTULOSE 10 GM/15ML PO SOLN
30.0000 g | Freq: Three times a day (TID) | ORAL | Status: DC
  Administered 2016-05-06 – 2016-05-07 (×4): 30 g via ORAL
  Filled 2016-05-06 (×4): qty 60

## 2016-05-06 NOTE — Progress Notes (Signed)
Patient seen and examined. Admitted after midnight secondary to worsening ascites and AMS. Patient with hx of cirrhosis and medication non-compliance. On admission ammonia level was in the 78's range. Per family he has not been using his lactulose as prescribed. Please refer to H&P written by Dr. Onalee Huaavid for further info/details.  Patient is currently more alert and is oriented X2; per family close to baseline. Hemodynamically stable and mainly complaining of being hungry and having abd distension. No fever appreciated and normal WBC's on bloodwork.  Plan: -will add order for paracentesis (therapeutic) -start spironolactone and lasix -low sodium diet -follow clinical response, electrolytes and ammonia level in am -home with family care at discharge  [name] LollMadera,  [name] [name] [phone]

## 2016-05-06 NOTE — Procedures (Signed)
PreOperative Dx: Cirrhosis, ascites Postoperative Dx: Cirrhosis, ascites Procedure:   US guided paracentesis Radiologist:  Tyron RussellBoles Anesthesia:  10 ml of1% lidocaine Specimen:  10.3 L of amber colored ascitic fluid EBL:   < 1 ml Complications: None

## 2016-05-06 NOTE — Care Management Important Message (Signed)
Important Message  Patient Details  Name: [name] [name] MRN: [id number] Date of Birth: August 31, 1951   Medicare Important Message Given:  Yes    Atilano Covelli, Chrystine OilerSharley Diane, RN 05/06/2016, 1:03 PM

## 2016-05-06 NOTE — Care Management Note (Signed)
Case Management Note  Patient Details  Name: [name] HouseholderRoy L Streety MRN: [id number] Date of Birth: 28-Sep-1951  Subjective/Objective:  Patient from home with wife. Has a cane and walker at home if needed. Has a PCP and insurance, reports no issues. States he has had HH in the past, but fired them because he "didn't need it" and is not open to having HH again.                  Action/Plan: Anticipate DC home with self care. No CM needs.    Expected Discharge Date:  05/08/16               Expected Discharge Plan:  Home/Self Care  In-House Referral:  NA  Discharge planning Services  CM Consult  Post Acute Care Choice:  NA Choice offered to:  NA  DME Arranged:    DME Agency:     HH Arranged:    HH Agency:     Status of Service:  In process, will continue to follow  If discussed at Long Length of Stay Meetings, dates discussed:    Additional Comments:  Dasiah Hooley, Chrystine OilerSharley Diane, RN 05/06/2016, 12:59 PM
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
## 2016-05-07 DIAGNOSIS — D696 Thrombocytopenia, unspecified: Secondary | ICD-10-CM

## 2016-05-07 DIAGNOSIS — E1165 Type 2 diabetes mellitus with hyperglycemia: Secondary | ICD-10-CM

## 2016-05-07 DIAGNOSIS — E1122 Type 2 diabetes mellitus with diabetic chronic kidney disease: Secondary | ICD-10-CM

## 2016-05-07 DIAGNOSIS — R188 Other ascites: Secondary | ICD-10-CM

## 2016-05-07 DIAGNOSIS — N185 Chronic kidney disease, stage 5: Secondary | ICD-10-CM

## 2016-05-07 DIAGNOSIS — Z794 Long term (current) use of insulin: Secondary | ICD-10-CM

## 2016-05-07 LAB — AMMONIA: AMMONIA: 50 umol/L — AB (ref 9–35)

## 2016-05-07 LAB — BASIC METABOLIC PANEL
Anion gap: 11 (ref 5–15)
BUN: 66 mg/dL — AB (ref 6–20)
CALCIUM: 8 mg/dL — AB (ref 8.9–10.3)
CHLORIDE: 108 mmol/L (ref 101–111)
CO2: 20 mmol/L — AB (ref 22–32)
CREATININE: 5.31 mg/dL — AB (ref 0.61–1.24)
GFR calc Af Amer: 12 mL/min — ABNORMAL LOW (ref >60)
GFR calc non Af Amer: 10 mL/min — ABNORMAL LOW (ref >60)
GLUCOSE: 179 mg/dL — AB (ref 65–99)
Potassium: 3.3 mmol/L — ABNORMAL LOW (ref 3.5–5.1)
Sodium: 139 mmol/L (ref 135–145)

## 2016-05-07 MED ORDER — LACTULOSE 10 GM/15ML PO SOLN: 20.0000 g | Freq: Two times a day (BID) | ORAL | 3 refills | Status: DC

## 2016-05-07 MED ORDER — ACETAMINOPHEN 325 MG PO TABS
325.0000 mg | ORAL_TABLET | Freq: Four times a day (QID) | ORAL | Status: DC | PRN
  Administered 2016-05-07: 325 mg via ORAL
  Filled 2016-05-07: qty 1

## 2016-05-07 MED ORDER — OXYCODONE HCL 5 MG PO TABS: 5.0000 mg | ORAL_TABLET | Freq: Four times a day (QID) | ORAL | Status: DC | PRN

## 2016-05-07 MED ORDER — FUROSEMIDE 40 MG PO TABS: 40.0000 mg | ORAL_TABLET | Freq: Every day | ORAL | 1 refills | Status: DC

## 2016-05-07 NOTE — Progress Notes (Signed)
Pt d/c to home. PIV removed by patient prior to RN being able to attempt proper removla. Catheter intact and site is clean and dry with a band aid applied. Discharge paperwork, prescriptions, and reasons to return to ED/MD reviewed with pt and pt's family at bedside. Pt to be escorted off of unit via wheelchair to main lobby.

## 2016-05-07 NOTE — Discharge Summary (Signed)
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 Discharge home with family care and instructions to follow up  With PCP in 10 days.  Diet recommendation: heart healthy/low sodium diet and modified carbohydrates   Filed Weights   05/05/16 2023 05/06/16 0005  Weight: 104.3 kg (230 lb) 91.1 kg (200 lb 14.4 oz)    History of present illness:  As per H&P written by Dr. Onalee Huaavid on 05/05/16 65 y.o. male with medical history significant of cirrhosis advanced, false positive HIV, copd, htn, comes in with family for worsening confusion for a day.  Pt does not take his medications every day as prescribed, sometimes he skips his am meds.  No fevers.  No n/v/d.  No focal neuro deficits. Patient with ascites; reporting he needs paracentesis about monthly.  Pt ammonia level wass high, referred for admission for hepatic encephalopathy.  Hospital Course:  1-hepatic encephalopathy: with ammonia level in 78's range -due to medication non-compliance  -afebrile and w/o signs of infection -as soon as lactulose resume his mentation clear -patient has been educated regarding medication compliance and proper protein intake -will follow up with PCP in 10 days -advise to maintain adequate hydration  2-chronic severe ascites: with meld score of 27 -will d/c statins -continue low dose spironolactone  and lasix -low sodium diet and use of lactulose  -prognosis is very poor  3-acute on chronic renal failure stage 5: Due to dehydration and 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 -will continue low dose spironolactone and lasix -minimize/avoid use o any other nephrotoxic agents -advise to follow low sodium diet and to maintain adequate hydration   4-chronic anemia: -due to anemia of chronic disease -no signs of acute bleeding appreciated -repeat CBC to follow Hgb trend   5-ascites: due to cirrhosis -advise to follow low sodium diet  -status post therapeutic paracentesis (10.3 L removed) -no signs of SBP on presentation; no fluid analysis ordered due to lack of support on his symptoms -will continue low dose spironolactone and lasix for volume management   6-uncontrolled diabetes with hyperglycemia and renal failure -will continue home hypoglycemic regimen   7-BPH: -will continue sue of flomax  8-GERD -will continue PPI   Procedures: Paracentesis 05/06/16 (10.3 L removed)  Consultations:  IR for paracentesis (10.3 L of ascitic fluid removed on 05/06/16)  Discharge Exam: Vitals:   05/07/16 0045 05/07/16 0500  BP: 121/65 119/62  Pulse: 97 79  Resp: 17 20  Temp: 98 F (36.7 C) 98.1 F (36.7 C)    General: afebrile, no CP, no SOB. Patient is status post paracentesis (10.3L removed) and with resolution of encephalopathy.  Cardiovascular: S1 and S2, no rubs or gallops  Respiratory: CTA bilaterally, no use of accessory muscles  Abd: very mild distension, positive BS, no tenderness  Extremities: trace edema bilaterally   Discharge Instructions   Discharge Instructions    Diet - low sodium heart healthy    Complete by:  As directed   Discharge instructions    Complete by:  As directed   Follow low sodium diet (less than 2 gram daily) Maintain adequate hydration  Take medications as prescribed Arrange follow up with PCP in 10 days     Current Discharge  Medication List    CONTINUE these medications which have CHANGED   Details  furosemide (LASIX) 40 MG tablet Take 1 tablet (40 mg total) by mouth daily. Qty: 30 tablet, Refills: 1    oxyCODONE (OXY IR/ROXICODONE) 5 MG immediate release tablet Take 1 tablet (5 mg total) by mouth every 6 (six) hours as needed for moderate pain.      CONTINUE these medications which have NOT CHANGED   Details  albuterol (PROVENTIL HFA;VENTOLIN HFA) 108 (90 Base) MCG/ACT inhaler Inhale 2 puffs into the lungs every 6 (six) hours as needed for wheezing or shortness of breath. Qty: 1 Inhaler, Refills: 0    ALPRAZolam (XANAX) 1 MG tablet Take 0.5-1 mg by mouth 3 (three) times daily as needed for anxiety or sleep.     cholecalciferol (VITAMIN D) 1000 units tablet Take 1,000 Units by mouth daily.    lactulose (CHRONULAC) 10 GM/15ML solution Take 30 mLs (20 g total) by mouth 2 (two) times daily. Qty: 240 mL, Refills: 0    levothyroxine (SYNTHROID, LEVOTHROID) 150 MCG tablet Take 150 mcg by mouth daily before breakfast.     magnesium oxide (MAGNESIUM-OXIDE) 400 (241.3 Mg) MG tablet Take 400 mg by mouth at bedtime.    pantoprazole (PROTONIX) 40 MG tablet Take 1 tablet (40 mg total) by mouth daily. Qty: 30 tablet, Refills: 0    rOPINIRole (REQUIP) 0.5 MG tablet Take 0.5 mg by mouth at bedtime.     spironolactone (ALDACTONE) 25 MG tablet Take 25 mg by mouth daily.    tamsulosin (FLOMAX) 0.4 MG CAPS capsule Take 0.4 mg by mouth daily after supper.     insulin glargine (LANTUS) 100 unit/mL SOPN Inject 5 Units into the skin at bedtime.    insulin lispro (HUMALOG KWIKPEN) 100 UNIT/ML KiwkPen Inject 12 Units into the skin 3 (three) times daily with meals.    Nutritional Supplements (FEEDING SUPPLEMENT, NEPRO CARB STEADY,) LIQD Take 237 mLs by mouth 2 (two) times daily between meals. Qty: 237 mL, Refills: 0      STOP taking these medications     gabapentin (NEURONTIN) 800 MG tablet      simvastatin (ZOCOR)  40 MG tablet      tiZANidine (ZANAFLEX) 4 MG tablet      torsemide (DEMADEX) 20 MG tablet      atorvastatin (LIPITOR) 40 MG tablet        No Known Allergies Follow-up Information    [name], PA Follow up on 05/14/2016.   Specialty:  Physician Assistant Why:  at 12:10 pm Contact information: [address] [place] [address] [phone]           The results of significant diagnostics from this hospitalization (including imaging, microbiology, ancillary and laboratory) are listed below for reference.    Significant Diagnostic Studies: Ct Head Wo Contrast  Result Date: 05/05/2016 CLINICAL DATA:  Acute onset of altered mental status. Initial encounter. EXAM: CT HEAD WITHOUT CONTRAST TECHNIQUE: Contiguous axial images were obtained from the base of the skull through the vertex without intravenous contrast. COMPARISON:  CT of the head performed 07/26/2011 FINDINGS: There is no evidence of acute infarction, mass lesion, or intra- or extra-axial hemorrhage on CT. Prominence  of ventricles and sulci reflects mild cortical volume loss. Mild cerebellar atrophy is noted. Mild periventricular and subcortical white matter change likely reflects small vessel ischemic microangiopathy. There may be an arachnoid cyst anterior to the left temporal lobe. The brainstem and fourth ventricle are within normal limits. The basal ganglia are unremarkable in appearance. The cerebral hemispheres demonstrate grossly normal gray-white differentiation. No mass effect or midline shift is seen. There is no evidence of fracture; visualized osseous structures are unremarkable in appearance. The orbits are within normal limits. The paranasal sinuses and mastoid air cells are well-aerated. No significant soft tissue abnormalities are seen. IMPRESSION: 1. No acute intracranial pathology seen on CT. 2. Mild cortical volume loss and scattered small vessel ischemic microangiopathy. 3. 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 Electronically Signed   By: Roanna Raider M.D.   On: 05/05/2016 22:09   US Paracentesis  Result Date: 05/06/2016 INDICATION: Cirrhosis, ascites EXAM: ULTRASOUND GUIDED  PARACENTESIS MEDICATIONS: None. COMPLICATIONS: None immediate. PROCEDURE: Procedure, benefits, and risks of procedure were discussed with patient. Written informed consent for procedure was obtained. Time out protocol followed. Adequate collection of ascites localized by ultrasound in RIGHT lower quadrant. Skin prepped and draped in usual sterile fashion. Skin and soft tissues anesthetized with 10 mL of 1% lidocaine. 5 [place] Yueh catheter placed into peritoneal cavity. 10.3 L of amber colored fluid aspirated by vacuum bottle suction. Procedure tolerated well by patient without immediate complication. FINDINGS: As above IMPRESSION: Successful ultrasound-guided paracentesis yielding 10.3 liters of peritoneal fluid. Electronically Signed   By: Ulyses Southward M.D.   On: 05/06/2016 18:12   US Paracentesis  Result Date: 04/22/2016 INDICATION: 65 year old male with liver disease presenting with recurrent ascites. EXAM: ULTRASOUND GUIDED  PARACENTESIS MEDICATIONS: 50 g of albumin IV. COMPLICATIONS: None immediate. PROCEDURE: Informed written consent was obtained from the patient after a discussion of the risks, benefits and alternatives to treatment. A timeout was performed prior to the initiation of the procedure. Initial ultrasound scanning demonstrates a large amount of ascites within the right lower abdominal quadrant. The right lower abdomen was prepped and draped in the usual sterile fashion. 1% lidocaine with epinephrine was used for local anesthesia. Following this, a 19 gauge, 7-cm, Yueh catheter was introduced. An ultrasound image was saved for documentation purposes. The paracentesis was performed. The catheter was removed and a dressing was applied. The patient tolerated the procedure well without immediate post  procedural complication. FINDINGS: A total of approximately 11,600 mL of milky serous fluid was removed. Samples were sent to the laboratory as requested by the clinical team. IMPRESSION: Successful ultrasound-guided paracentesis yielding 11,600 mL liters of peritoneal fluid. Electronically Signed   By: Trudie Reed M.D.   On: 04/22/2016 15:31  US Paracentesis  Result Date: 04/10/2016 INDICATION: CIrrhosis, ascites EXAM: ULTRASOUND GUIDED DIAGNOSTIC AND THERAPEUTIC PARACENTESIS MEDICATIONS: None. COMPLICATIONS: None immediate. PROCEDURE: Procedure, benefits, and risks of procedure were discussed with patient. Written informed consent for procedure was obtained. Time out protocol followed. Adequate collection of ascites localized by ultrasound in LEFT lower quadrant. Skin prepped and draped in usual sterile fashion. Skin and soft tissues anesthetized with 10 mL of 1% lidocaine. 5 [place] Yueh catheter placed into peritoneal cavity. 9.8 L of yellow peritoneal fluid aspirated by vacuum bottle suction. Procedure tolerated well by patient without immediate complication. FINDINGS: A total of approximately 9.8 L of ascitic fluid was removed. Samples were sent to the laboratory as requested by the clinical team. IMPRESSION: Successful ultrasound-guided paracentesis  yielding 9.8 liters of peritoneal fluid. Electronically Signed   By: Ulyses Southward M.D.   On: 04/10/2016 11:51   Dg Chest Port 1 View  Result Date: 05/05/2016 CLINICAL DATA:  Initial evaluation for acute altered mental status, lethargic. History of hypertension, COPD. EXAM: PORTABLE CHEST 1 VIEW COMPARISON:  Prior radiograph from 03/31/2016. FINDINGS: Mild cardiomegaly is stable from previous study. Mediastinal silhouette within normal limits. Trach air column bowed to the right about the aortic knob, stable. Lungs are hypoinflated. Changes related COPD noted. Diffuse vascular congestion with indistinctness of the interstitial markings, suggestive of  pulmonary interstitial edema. No frank pleural effusion. Streaky perihilar opacities on the right favored to reflect atelectasis. No consolidative airspace disease. No pneumothorax. No acute osseous abnormality. IMPRESSION: 1. Diffuse vascular congestion with indistinctness of the interstitial markings, suggestive of mild diffuse pulmonary interstitial edema. 2. Shallow lung inflation with streaky right perihilar atelectatic changes. Electronically Signed   By: Rise Mu M.D.   On: 05/05/2016 21:00    Microbiology: No results found for this or any previous visit (from the past 240 hour(s)).   Labs: Basic Metabolic Panel:  Recent Labs Lab 05/05/16 2051 05/07/16 0539  NA 134* 139  K 3.8 3.3*  CL 106 108  CO2 19* 20*  GLUCOSE 113* 179*  BUN 69* 66*  CREATININE 5.75* 5.31*  CALCIUM 8.3* 8.0*   Liver Function Tests:  Recent Labs Lab 05/05/16 2051  AST 43*  ALT 26  ALKPHOS 125  BILITOT 2.3*  PROT 6.4*  ALBUMIN 2.9*    Recent Labs Lab 05/05/16 2051 05/07/16 0539  AMMONIA 78* 50*   CBC:  Recent Labs Lab 05/05/16 2051  WBC 7.4  NEUTROABS 4.9  HGB 9.4*  HCT 26.1*  MCV 90.3  PLT 64*   BNP (last 3 results)  Recent Labs  01/07/16 1453  BNP 94.0    Signed:  Vassie Loll MD.  [facility] 05/07/2016, 2:08 PM

## 2016-05-14 ENCOUNTER — Encounter: Payer: Self-pay | Admitting: Physician Assistant

## 2016-05-14 ENCOUNTER — Ambulatory Visit (INDEPENDENT_AMBULATORY_CARE_PROVIDER_SITE_OTHER): Payer: Medicare Other | Admitting: Physician Assistant

## 2016-05-14 VITALS — BP 95/67 | HR 88 | Temp 97.3°F | Ht 69.0 in | Wt 194.0 lb

## 2016-05-14 DIAGNOSIS — N182 Chronic kidney disease, stage 2 (mild): Secondary | ICD-10-CM

## 2016-05-14 DIAGNOSIS — J810 Acute pulmonary edema: Secondary | ICD-10-CM | POA: Diagnosis not present

## 2016-05-14 DIAGNOSIS — J9601 Acute respiratory failure with hypoxia: Secondary | ICD-10-CM

## 2016-05-14 DIAGNOSIS — G2581 Restless legs syndrome: Secondary | ICD-10-CM

## 2016-05-14 DIAGNOSIS — N184 Chronic kidney disease, stage 4 (severe): Secondary | ICD-10-CM

## 2016-05-14 DIAGNOSIS — N400 Enlarged prostate without lower urinary tract symptoms: Secondary | ICD-10-CM

## 2016-05-14 DIAGNOSIS — K7031 Alcoholic cirrhosis of liver with ascites: Secondary | ICD-10-CM

## 2016-05-14 DIAGNOSIS — E039 Hypothyroidism, unspecified: Secondary | ICD-10-CM

## 2016-05-14 DIAGNOSIS — E1122 Type 2 diabetes mellitus with diabetic chronic kidney disease: Secondary | ICD-10-CM

## 2016-05-14 DIAGNOSIS — E1165 Type 2 diabetes mellitus with hyperglycemia: Secondary | ICD-10-CM

## 2016-05-14 DIAGNOSIS — I8501 Esophageal varices with bleeding: Secondary | ICD-10-CM

## 2016-05-14 MED ORDER — LEVOTHYROXINE SODIUM 150 MCG PO TABS: 150.0000 ug | ORAL_TABLET | Freq: Every day | ORAL | 12 refills | Status: DC

## 2016-05-14 MED ORDER — FUROSEMIDE 20 MG PO TABS: 20.0000 mg | ORAL_TABLET | Freq: Every day | ORAL | 3 refills | Status: DC

## 2016-05-14 MED ORDER — ROPINIROLE HCL 0.5 MG PO TABS: 0.5000 mg | ORAL_TABLET | Freq: Every day | ORAL | 6 refills | Status: DC

## 2016-05-14 MED ORDER — TAMSULOSIN HCL 0.4 MG PO CAPS: 0.4000 mg | ORAL_CAPSULE | Freq: Every day | ORAL | 11 refills | Status: DC
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
## 2016-05-14 NOTE — Patient Instructions (Signed)
Insomnia Insomnia is a sleep disorder that makes it difficult to fall asleep or to stay asleep. Insomnia can cause tiredness (fatigue), low energy, difficulty concentrating, mood swings, and poor performance at work or school.  There are three different ways to classify insomnia:  Difficulty falling asleep.  Difficulty staying asleep.  Waking up too early in the morning. Any type of insomnia can be long-term (chronic) or short-term (acute). Both are common. Short-term insomnia usually lasts for three months or less. Chronic insomnia occurs at least three times a week for longer than three months. CAUSES  Insomnia may be caused by another condition, situation, or substance, such as:  Anxiety.  Certain medicines.  Gastroesophageal reflux disease (GERD) or other gastrointestinal conditions.  Asthma or other breathing conditions.  Restless legs syndrome, sleep apnea, or other sleep disorders.  Chronic pain.  Menopause. This may include hot flashes.  Stroke.  Abuse of alcohol, tobacco, or illegal drugs.  Depression.  Caffeine.   Neurological disorders, such as Alzheimer disease.  An overactive thyroid (hyperthyroidism). The cause of insomnia may not be known. RISK FACTORS Risk factors for insomnia include:  Gender. Women are more commonly affected than men.  Age. Insomnia is more common as you get older.  Stress. This may involve your professional or personal life.  Income. Insomnia is more common in people with lower income.  Lack of exercise.   Irregular work schedule or night shifts.  Traveling between different time zones. SIGNS AND SYMPTOMS If you have insomnia, trouble falling asleep or trouble staying asleep is the main symptom. This may lead to other symptoms, such as:  Feeling fatigued.  Feeling nervous about going to sleep.  Not feeling rested in the morning.  Having trouble concentrating.  Feeling irritable, anxious, or depressed. TREATMENT   Treatment for insomnia depends on the cause. If your insomnia is caused by an underlying condition, treatment will focus on addressing the condition. Treatment may also include:   Medicines to help you sleep.  Counseling or therapy.  Lifestyle adjustments. HOME CARE INSTRUCTIONS   Take medicines only as directed by your health care provider.  Keep regular sleeping and waking hours. Avoid naps.  Keep a sleep diary to help you and your health care provider figure out what could be causing your insomnia. Include:   When you sleep.  When you wake up during the night.  How well you sleep.   How rested you feel the next day.  Any side effects of medicines you are taking.  What you eat and drink.   Make your bedroom a comfortable place where it is easy to fall asleep:  Put up shades or special blackout curtains to block light from outside.  Use a white noise machine to block noise.  Keep the temperature cool.   Exercise regularly as directed by your health care provider. Avoid exercising right before bedtime.  Use relaxation techniques to manage stress. Ask your health care provider to suggest some techniques that may work well for you. These may include:  Breathing exercises.  Routines to release muscle tension.  Visualizing peaceful scenes.  Cut back on alcohol, caffeinated beverages, and cigarettes, especially close to bedtime. These can disrupt your sleep.  Do not overeat or eat spicy foods right before bedtime. This can lead to digestive discomfort that can make it hard for you to sleep.  Limit screen use before bedtime. This includes:  Watching TV.  Using your smartphone, tablet, and computer.  Stick to a routine. This   can help you fall asleep faster. Try to do a quiet activity, brush your teeth, and go to bed at the same time each night.  Get out of bed if you are still awake after 15 minutes of trying to sleep. Keep the lights down, but try reading or  doing a quiet activity. When you feel sleepy, go back to bed.  Make sure that you drive carefully. Avoid driving if you feel very sleepy.  Keep all follow-up appointments as directed by your health care provider. This is important. SEEK MEDICAL CARE IF:   You are tired throughout the day or have trouble in your daily routine due to sleepiness.  You continue to have sleep problems or your sleep problems get worse. SEEK IMMEDIATE MEDICAL CARE IF:   You have serious thoughts about hurting yourself or someone else.   This information is not intended to replace advice given to you by your health care provider. Make sure you discuss any questions you have with your health care provider.   Document Released: 09/11/2000 Document Revised: 06/05/2015 Document Reviewed: 06/15/2014 Elsevier Interactive Patient Education 2016 Elsevier Inc.  

## 2016-05-21 ENCOUNTER — Telehealth: Payer: Self-pay | Admitting: Internal Medicine

## 2016-05-21 NOTE — Telephone Encounter (Signed)
Pt wants to have fluid drawn off of his abdomen and he is also due for a 6 month ultrasound in September. Wife is wanting to schedule both. Please call [phone]

## 2016-05-22 ENCOUNTER — Ambulatory Visit (HOSPITAL_COMMUNITY): Admission: RE | Admit: 2016-05-22 | Payer: Medicare Other | Source: Ambulatory Visit

## 2016-05-22 ENCOUNTER — Other Ambulatory Visit: Payer: Self-pay

## 2016-05-22 DIAGNOSIS — R188 Other ascites: Principal | ICD-10-CM

## 2016-05-22 DIAGNOSIS — K7460 Unspecified cirrhosis of liver: Secondary | ICD-10-CM

## 2016-05-22 NOTE — Telephone Encounter (Signed)
Pt is set up for para and abd us today. Wife is aware.

## 2016-05-25 ENCOUNTER — Ambulatory Visit (HOSPITAL_COMMUNITY)
Admission: RE | Admit: 2016-05-25 | Discharge: 2016-05-25 | Disposition: A | Discharge status: Home / Self Care | Payer: Medicare Other | Source: Ambulatory Visit | Attending: Gastroenterology | Admitting: Gastroenterology

## 2016-05-25 ENCOUNTER — Encounter (HOSPITAL_COMMUNITY): Payer: Self-pay

## 2016-05-25 DIAGNOSIS — K7031 Alcoholic cirrhosis of liver with ascites: Secondary | ICD-10-CM | POA: Diagnosis not present

## 2016-05-25 DIAGNOSIS — R188 Other ascites: Principal | ICD-10-CM

## 2016-05-25 DIAGNOSIS — K7460 Unspecified cirrhosis of liver: Secondary | ICD-10-CM | POA: Diagnosis present

## 2016-05-25 LAB — BODY FLUID CELL COUNT WITH DIFFERENTIAL
Eos, Fluid: 0 %
LYMPHS FL: 65 %
Monocyte-Macrophage-Serous Fluid: 33 % — ABNORMAL LOW (ref 50–90)
Neutrophil Count, Fluid: 2 % (ref 0–25)
Total Nucleated Cell Count, Fluid: 66 cu mm (ref 0–1000)

## 2016-05-25 LAB — GRAM STAIN

## 2016-05-25 MED ORDER — ALBUMIN HUMAN 25 % IV SOLN
INTRAVENOUS | Status: AC
  Administered 2016-05-25: 50 g via INTRAVENOUS
  Filled 2016-05-25: qty 100

## 2016-05-25 MED ORDER — ALBUMIN HUMAN 25 % IV SOLN
INTRAVENOUS | Status: AC
  Filled 2016-05-25: qty 100

## 2016-05-25 MED ORDER — ALBUMIN HUMAN 25 % IV SOLN
50.0000 g | Freq: Once | INTRAVENOUS | Status: AC
  Administered 2016-05-25: 50 g via INTRAVENOUS

## 2016-05-25 NOTE — Procedures (Signed)
PreOperative Dx: Alcoholic cirrhosis, ascites Postoperative Dx: Alcoholic cirrhosis, ascites Procedure:   US guided paracentesis Radiologist:  Herbert Aguinaldo Anesthesia:  10 ml of 1% lidocaine Specimen:  8 L of yellow ascitic fluid EBL:   < 1 ml Complications:  None   

## 2016-05-25 NOTE — Progress Notes (Signed)
Paracentesis complete no signs of distress. 8L yellow colored ascites removed.  

## 2016-05-26 LAB — PATHOLOGIST SMEAR REVIEW

## 2016-05-27 NOTE — Progress Notes (Signed)
No evidence of hepatoma. Next ruq u/s in six months.  Overdue for follow up with rmr only. He is due for labs follow hospitalization: needs CMET, CBC, PT/INR

## 2016-05-29 ENCOUNTER — Other Ambulatory Visit: Payer: Self-pay | Admitting: Physician Assistant

## 2016-05-30 LAB — CULTURE, BODY FLUID-BOTTLE

## 2016-05-30 LAB — CULTURE, BODY FLUID W GRAM STAIN -BOTTLE: Culture: NO GROWTH

## 2016-06-02 ENCOUNTER — Other Ambulatory Visit: Payer: Self-pay | Admitting: Physician Assistant

## 2016-06-02 MED ORDER — ALPRAZOLAM 1 MG PO TABS: 0.5000 mg | ORAL_TABLET | Freq: Three times a day (TID) | ORAL | 5 refills | Status: DC | PRN

## 2016-06-02 NOTE — Telephone Encounter (Signed)
Rx printed, patient can pick up

## 2016-06-02 NOTE — Telephone Encounter (Signed)
rx called into pharmacy

## 2016-06-03 ENCOUNTER — Encounter: Payer: Self-pay | Admitting: Internal Medicine

## 2016-06-03 ENCOUNTER — Other Ambulatory Visit: Payer: Self-pay | Admitting: Gastroenterology

## 2016-06-03 ENCOUNTER — Other Ambulatory Visit: Payer: Self-pay

## 2016-06-03 DIAGNOSIS — K7031 Alcoholic cirrhosis of liver with ascites: Secondary | ICD-10-CM

## 2016-06-15 ENCOUNTER — Ambulatory Visit (HOSPITAL_COMMUNITY)
Admission: RE | Admit: 2016-06-15 | Discharge: 2016-06-15 | Disposition: A | Discharge status: Home / Self Care | Payer: Medicare Other | Source: Ambulatory Visit | Attending: Gastroenterology | Admitting: Gastroenterology

## 2016-06-15 ENCOUNTER — Telehealth: Payer: Self-pay

## 2016-06-15 ENCOUNTER — Other Ambulatory Visit: Payer: Self-pay

## 2016-06-15 DIAGNOSIS — R188 Other ascites: Secondary | ICD-10-CM | POA: Diagnosis not present

## 2016-06-15 LAB — BODY FLUID CELL COUNT WITH DIFFERENTIAL
Eos, Fluid: 0 %
LYMPHS FL: 72 %
MONOCYTE-MACROPHAGE-SEROUS FLUID: 21 % — AB (ref 50–90)
Neutrophil Count, Fluid: 7 % (ref 0–25)
WBC FLUID: 61 uL (ref 0–1000)

## 2016-06-15 LAB — GRAM STAIN

## 2016-06-15 MED ORDER — ALBUMIN HUMAN 25 % IV SOLN
50.0000 g | Freq: Once | INTRAVENOUS | Status: AC
  Administered 2016-06-15: 50 g via INTRAVENOUS

## 2016-06-15 MED ORDER — ALBUMIN HUMAN 25 % IV SOLN
INTRAVENOUS | Status: AC
  Administered 2016-06-15: 50 g via INTRAVENOUS
  Filled 2016-06-15: qty 200

## 2016-06-15 NOTE — Telephone Encounter (Signed)
open in error

## 2016-06-15 NOTE — Procedures (Signed)
Follow-Up: [None]  MEDICATIONS: [None.] COMPLICATIONS: <48 hrs).\nSIR LEVEL D - Requires major therapy, prolonged hospitalization (>48 hours).\nSIR LEVEL E - Permanent adverse sequelae.\nSIR LEVEL F - Death.\nDelayed -","cue":"complications","grammar":"None immediate.  SIR Level A - No therapy, no consequence.  SIR LEVEL B - Normal therapy, includes overnight admission for observation.  SIR LEVEL C - Requires therapy, minor hospitalization (<48 hrs).  SIR LEVEL D - Requires major therapy, prolonged hospitalization (>48 hours).  SIR LEVEL E - Permanent adverse sequelae.  SIR LEVEL F - Death.  Delayed -","dictation":false}}'>[None immediate.] PROCEDURE: Informed written consent was obtained from the patient after a discussion of the risks, benefits and alternatives to treatment. A timeout was performed prior to the initiation of the procedure.  Initial ultrasound scanning demonstrates a [large] amount of ascites within the right lower abdominal quadrant. The right lower abdomen was prepped and draped in the usual sterile fashion. 1% lidocaine with epinephrine was used for local anesthesia.   Following this, a [19 gauge, 7-cm, Yueh] catheter was introduced. An ultrasound image was saved for documentation purposes. The paracentesis was performed. The catheter was removed and a dressing was applied. The patient tolerated the procedure well without immediate post procedural complication.  FINDINGS: A total of approximately [9604[8800 cc] of [clear] fluid was removed. [Samples were sent to the laboratory as requested by the clinical team.] IMPRESSION:  Successful ultrasound-guided paracentesis yielding [8.8] liters of peritoneal fluid.

## 2016-06-15 NOTE — Progress Notes (Signed)
Paracentesis complete no signs of distress. 8800 ml yellow colored ascites removed.  

## 2016-06-17 LAB — PATHOLOGIST SMEAR REVIEW

## 2016-06-20 LAB — CULTURE, BODY FLUID-BOTTLE

## 2016-06-20 LAB — CULTURE, BODY FLUID W GRAM STAIN -BOTTLE
Culture: NO GROWTH
Gram Stain: NONE SEEN

## 2016-06-25 ENCOUNTER — Telehealth: Payer: Self-pay | Admitting: Internal Medicine

## 2016-06-25 NOTE — Telephone Encounter (Signed)
Wife called to say that patient needed fluid drawn off his abdomen and would like to go tomorrow. Please advise and call [phone]

## 2016-06-26 ENCOUNTER — Encounter (HOSPITAL_COMMUNITY): Payer: Self-pay

## 2016-06-26 ENCOUNTER — Ambulatory Visit (HOSPITAL_COMMUNITY)
Admission: RE | Admit: 2016-06-26 | Discharge: 2016-06-26 | Disposition: A | Discharge status: Home / Self Care | Payer: Medicare Other | Source: Ambulatory Visit | Attending: Gastroenterology | Admitting: Gastroenterology

## 2016-06-26 ENCOUNTER — Other Ambulatory Visit: Payer: Self-pay

## 2016-06-26 DIAGNOSIS — R188 Other ascites: Secondary | ICD-10-CM | POA: Insufficient documentation

## 2016-06-26 LAB — BODY FLUID CELL COUNT WITH DIFFERENTIAL
LYMPHS FL: 60 %
Monocyte-Macrophage-Serous Fluid: 10 % — ABNORMAL LOW (ref 50–90)
NEUTROPHIL FLUID: 30 % — AB (ref 0–25)
WBC FLUID: 66 uL (ref 0–1000)

## 2016-06-26 LAB — GRAM STAIN

## 2016-06-26 MED ORDER — ALBUMIN HUMAN 25 % IV SOLN
25.0000 g | Freq: Once | INTRAVENOUS | Status: AC
  Administered 2016-06-26: 25 g via INTRAVENOUS

## 2016-06-26 NOTE — Telephone Encounter (Signed)
Attempted to call pt but voicemail was full. Pt returned call. Informed of Paracentesis scheduled for 10:45am today.

## 2016-06-26 NOTE — Sedation Documentation (Signed)
9200cc fluid removed.  Pt tolerated well.  No distress.

## 2016-06-27 ENCOUNTER — Other Ambulatory Visit: Payer: Self-pay | Admitting: Physician Assistant

## 2016-06-29 LAB — PATHOLOGIST SMEAR REVIEW

## 2016-06-30 ENCOUNTER — Ambulatory Visit: Payer: Medicare Other | Admitting: Internal Medicine

## 2016-06-30 ENCOUNTER — Encounter: Payer: Self-pay | Admitting: Internal Medicine

## 2016-06-30 ENCOUNTER — Telehealth: Payer: Self-pay | Admitting: Internal Medicine

## 2016-06-30 NOTE — Telephone Encounter (Signed)
PATIENT WAS A NO SHOW AND LETTER SENT  °

## 2016-07-01 LAB — CULTURE, BODY FLUID W GRAM STAIN -BOTTLE: Culture: NO GROWTH

## 2016-07-01 LAB — CULTURE, BODY FLUID-BOTTLE

## 2016-07-06 ENCOUNTER — Other Ambulatory Visit: Payer: Self-pay

## 2016-07-06 ENCOUNTER — Telehealth: Payer: Self-pay | Admitting: Internal Medicine

## 2016-07-06 DIAGNOSIS — R188 Other ascites: Secondary | ICD-10-CM

## 2016-07-06 NOTE — Telephone Encounter (Signed)
Pt's wife called to say that he needed to have fluid drawn off and would like to have it done tomorrow. Please call [phone]

## 2016-07-06 NOTE — Telephone Encounter (Signed)
Pt is set for PARA on 07/07/16 @ 12:15 pm. He is aware. I also may him a follow up appointment on 07/14/16 @3 :00 with RMR

## 2016-07-07 ENCOUNTER — Encounter (HOSPITAL_COMMUNITY): Payer: Self-pay

## 2016-07-07 ENCOUNTER — Ambulatory Visit (HOSPITAL_COMMUNITY)
Admission: RE | Admit: 2016-07-07 | Discharge: 2016-07-07 | Disposition: A | Discharge status: Home / Self Care | Payer: Medicare Other | Source: Ambulatory Visit | Attending: Gastroenterology | Admitting: Gastroenterology

## 2016-07-07 DIAGNOSIS — R188 Other ascites: Secondary | ICD-10-CM | POA: Insufficient documentation

## 2016-07-07 LAB — BODY FLUID CELL COUNT WITH DIFFERENTIAL
Eos, Fluid: 0 %
Lymphs, Fluid: 58 %
Monocyte-Macrophage-Serous Fluid: 39 % — ABNORMAL LOW (ref 50–90)
Neutrophil Count, Fluid: 3 % (ref 0–25)
WBC FLUID: 53 uL (ref 0–1000)

## 2016-07-07 LAB — GRAM STAIN

## 2016-07-07 MED ORDER — ALBUMIN HUMAN 25 % IV SOLN
INTRAVENOUS | Status: AC
  Administered 2016-07-07: 50 g via INTRAVENOUS
  Filled 2016-07-07: qty 200

## 2016-07-07 NOTE — Progress Notes (Signed)
Paracentesis complete no signs of distress. 10000 ml yellow colored ascites.

## 2016-07-08 LAB — PATHOLOGIST SMEAR REVIEW

## 2016-07-10 ENCOUNTER — Ambulatory Visit (INDEPENDENT_AMBULATORY_CARE_PROVIDER_SITE_OTHER): Payer: Medicare Other | Admitting: Physician Assistant

## 2016-07-10 ENCOUNTER — Encounter: Payer: Self-pay | Admitting: Physician Assistant

## 2016-07-10 VITALS — BP 108/80 | HR 97 | Temp 96.7°F | Ht 69.0 in | Wt 176.8 lb

## 2016-07-10 DIAGNOSIS — E1122 Type 2 diabetes mellitus with diabetic chronic kidney disease: Secondary | ICD-10-CM

## 2016-07-10 DIAGNOSIS — K7460 Unspecified cirrhosis of liver: Secondary | ICD-10-CM

## 2016-07-10 DIAGNOSIS — E039 Hypothyroidism, unspecified: Secondary | ICD-10-CM

## 2016-07-10 DIAGNOSIS — N182 Chronic kidney disease, stage 2 (mild): Secondary | ICD-10-CM

## 2016-07-10 DIAGNOSIS — N185 Chronic kidney disease, stage 5: Secondary | ICD-10-CM

## 2016-07-10 DIAGNOSIS — R188 Other ascites: Secondary | ICD-10-CM

## 2016-07-10 DIAGNOSIS — E1165 Type 2 diabetes mellitus with hyperglycemia: Secondary | ICD-10-CM

## 2016-07-10 DIAGNOSIS — I8510 Secondary esophageal varices without bleeding: Secondary | ICD-10-CM

## 2016-07-10 LAB — BAYER DCA HB A1C WAIVED: HB A1C (BAYER DCA - WAIVED): 5.7 % (ref <7.0)
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
## 2016-07-10 NOTE — Patient Instructions (Signed)
Low-Sodium Eating Plan °Sodium raises blood pressure and causes water to be held in the body. Getting less sodium from food will help lower your blood pressure, reduce any swelling, and protect your heart, liver, and kidneys. We get sodium by adding salt (sodium chloride) to food. Most of our sodium comes from canned, boxed, and frozen foods. Restaurant foods, fast foods, and pizza are also very high in sodium. Even if you take medicine to lower your blood pressure or to reduce fluid in your body, getting less sodium from your food is important. °WHAT IS MY PLAN? °Most people should limit their sodium intake to 2,300 mg a day. Your health care provider recommends that you limit your sodium intake to __________ a day.  °WHAT DO I NEED TO KNOW ABOUT THIS EATING PLAN? °For the low-sodium eating plan, you will follow these general guidelines: °· Choose foods with a % Daily Value for sodium of less than 5% (as listed on the food label).   °· Use salt-free seasonings or herbs instead of table salt or sea salt.   °· Check with your health care provider or pharmacist before using salt substitutes.   °· Eat fresh foods. °· Eat more vegetables and fruits. °· Limit canned vegetables. If you do use them, rinse them well to decrease the sodium.   °· Limit cheese to 1 oz (28 g) per day.    °· Eat lower-sodium products, often labeled as "lower sodium" or "no salt added." °· Avoid foods that contain monosodium glutamate (MSG). MSG is sometimes added to Chinese food and some canned foods.   °· Check food labels (Nutrition Facts labels) on foods to learn how much sodium is in one serving. °· Eat more home-cooked food and less restaurant, buffet, and fast food.  °· When eating at a restaurant, ask that your food be prepared with less salt, or no salt if possible.   °HOW DO I READ FOOD LABELS FOR SODIUM INFORMATION? °The Nutrition Facts label lists the amount of sodium in one serving of the food. If you eat more than one serving, you  must multiply the listed amount of sodium by the number of servings. °Food labels may also identify foods as: °· Sodium free--Less than 5 mg in a serving. °· Very low sodium--35 mg or less in a serving. °· Low sodium--140 mg or less in a serving. °· Light in sodium--50% less sodium in a serving. For example, if a food that usually has 300 mg of sodium is changed to become light in sodium, it will have 150 mg of sodium. °· Reduced sodium--25% less sodium in a serving. For example, if a food that usually has 400 mg of sodium is changed to reduced sodium, it will have 300 mg of sodium. °WHAT FOODS CAN I EAT? °Grains  °Low-sodium cereals, including oats, puffed wheat and rice, and shredded wheat cereals. Low-sodium crackers. Unsalted rice and pasta. Lower-sodium bread.  °Vegetables  °Frozen or fresh vegetables. Low-sodium or reduced-sodium canned vegetables. Low-sodium or reduced-sodium tomato sauce and paste. Low-sodium or reduced-sodium tomato and vegetable juices.  °Fruits  °Fresh, frozen, and canned fruit. Fruit juice.  °Meat and Other Protein Products  °Low-sodium canned tuna and salmon. Fresh or frozen meat, poultry, seafood, and fish. Lamb. Unsalted nuts. Dried beans, peas, and lentils without added salt. Unsalted canned beans. Homemade soups without salt. Eggs.  °Dairy  °Milk. Soy milk. Ricotta cheese. Low-sodium or reduced-sodium cheeses. Yogurt.  °Condiments  °Fresh and dried herbs and spices. Salt-free seasonings. Onion and garlic powders. Low-sodium varieties of mustard and ketchup. Fresh or refrigerated horseradish. Lemon   juice.  °Fats and Oils   °Reduced-sodium salad dressings. Unsalted butter.   °Other  °Unsalted popcorn and pretzels.  °The items listed above may not be a complete list of recommended foods or beverages. Contact your dietitian for more options. °WHAT FOODS ARE NOT RECOMMENDED? °Grains  °Instant hot cereals. Bread stuffing, pancake, and biscuit mixes. Croutons. Seasoned rice or pasta mixes.  Noodle soup cups. Boxed or frozen macaroni and cheese. Self-rising flour. Regular salted crackers. °Vegetables  °Regular canned vegetables. Regular canned tomato sauce and paste. Regular tomato and vegetable juices. Frozen vegetables in sauces. Salted French fries. Olives. Pickles. Relishes. Sauerkraut. Salsa. °Meat and Other Protein Products  °Salted, canned, smoked, spiced, or pickled meats, seafood, or fish. Bacon, ham, sausage, hot dogs, corned beef, chipped beef, and packaged luncheon meats. Salt pork. Jerky. Pickled herring. Anchovies, regular canned tuna, and sardines. Salted nuts. °Dairy  °Processed cheese and cheese spreads. Cheese curds. Blue cheese and cottage cheese. Buttermilk.  °Condiments  °Onion and garlic salt, seasoned salt, table salt, and sea salt. Canned and packaged gravies. Worcestershire sauce. Tartar sauce. Barbecue sauce. Teriyaki sauce. Soy sauce, including reduced sodium. Steak sauce. Fish sauce. Oyster sauce. Cocktail sauce. Horseradish that you find on the shelf. Regular ketchup and mustard. Meat flavorings and tenderizers. Bouillon cubes. Hot sauce. Tabasco sauce. Marinades. Taco seasonings. Relishes. °Fats and Oils   °Regular salad dressings. Salted butter. Margarine. Ghee. Bacon fat.  °Other  °Potato and tortilla chips. Corn chips and puffs. Salted popcorn and pretzels. Canned or dried soups. Pizza. Frozen entrees and pot pies.   °The items listed above may not be a complete list of foods and beverages to avoid. Contact your dietitian for more information. °  °This information is not intended to replace advice given to you by your health care provider. Make sure you discuss any questions you have with your health care provider. °  °Document Released: 03/06/2002 Document Revised: 10/05/2014 Document Reviewed: 07/19/2013 °Elsevier Interactive Patient Education ©2016 Elsevier Inc. ° °

## 2016-07-10 NOTE — Addendum Note (Signed)
Addended by: Almeta MonasSTONE, JANIE M on: 07/10/2016 03:55 PM   Modules accepted: Orders

## 2016-07-10 NOTE — Addendum Note (Signed)
Addended by: Orma RenderHODGES, Aqsa Sensabaugh F on: 07/10/2016 04:08 PM   Modules accepted: Orders

## 2016-07-11 LAB — CBC WITH DIFFERENTIAL/PLATELET
BASOS ABS: 0.1 10*3/uL (ref 0.0–0.2)
Basos: 1 %
EOS (ABSOLUTE): 0.3 10*3/uL (ref 0.0–0.4)
EOS: 4 %
Hematocrit: 29.9 % — ABNORMAL LOW (ref 37.5–51.0)
Hemoglobin: 10.2 g/dL — ABNORMAL LOW (ref 12.6–17.7)
IMMATURE GRANULOCYTES: 0 %
Immature Grans (Abs): 0 10*3/uL (ref 0.0–0.1)
Lymphocytes Absolute: 1.4 10*3/uL (ref 0.7–3.1)
Lymphs: 20 %
MCH: 30.5 pg (ref 26.6–33.0)
MCHC: 34.1 g/dL (ref 31.5–35.7)
MCV: 90 fL (ref 79–97)
MONOS ABS: 0.6 10*3/uL (ref 0.1–0.9)
Monocytes: 9 %
NEUTROS PCT: 66 %
Neutrophils Absolute: 4.9 10*3/uL (ref 1.4–7.0)
PLATELETS: 131 10*3/uL — AB (ref 150–379)
RBC: 3.34 x10E6/uL — AB (ref 4.14–5.80)
RDW: 14.5 % (ref 12.3–15.4)
WBC: 7.3 10*3/uL (ref 3.4–10.8)

## 2016-07-11 LAB — TSH: TSH: 42.45 u[IU]/mL — ABNORMAL HIGH (ref 0.450–4.500)

## 2016-07-12 LAB — CULTURE, BODY FLUID-BOTTLE: CULTURE: NO GROWTH

## 2016-07-12 LAB — CULTURE, BODY FLUID W GRAM STAIN -BOTTLE

## 2016-07-13 ENCOUNTER — Other Ambulatory Visit: Payer: Self-pay | Admitting: *Deleted

## 2016-07-13 MED ORDER — LEVOTHYROXINE SODIUM 200 MCG PO TABS: 200.0000 ug | ORAL_TABLET | Freq: Every day | ORAL | 2 refills | Status: DC

## 2016-07-14 ENCOUNTER — Encounter: Payer: Self-pay | Admitting: Internal Medicine

## 2016-07-14 ENCOUNTER — Telehealth: Payer: Self-pay | Admitting: Internal Medicine

## 2016-07-14 ENCOUNTER — Ambulatory Visit: Payer: Medicare Other | Admitting: Internal Medicine

## 2016-07-14 NOTE — Telephone Encounter (Signed)
PT WAS A NO SHOW AND LETTER SENT  °

## 2016-07-20 ENCOUNTER — Telehealth: Payer: Self-pay | Admitting: Internal Medicine

## 2016-07-20 ENCOUNTER — Other Ambulatory Visit: Payer: Self-pay

## 2016-07-20 DIAGNOSIS — R188 Other ascites: Secondary | ICD-10-CM

## 2016-07-20 NOTE — Telephone Encounter (Signed)
Pt is set up for PARA on 07/21/16 at 8:45 he is aware and his Lucila MaineGrandson is aware of his office visit on 07/31/16 @ 11:00

## 2016-07-20 NOTE — Telephone Encounter (Signed)
Darel HongJudy from radiology called to say that patient in scheduled for tomorrow at [facility]9am for a para and she needs a signed order faxed to her.

## 2016-07-20 NOTE — Telephone Encounter (Signed)
Please advise since he has no showed for the last 2 appointments

## 2016-07-20 NOTE — Telephone Encounter (Signed)
Please schedule patient to be seen by RMR only 07/31/16. Multiple openings on that day. May schedule LVAP now.   IF HE NO SHOWS HIS NEXT APPOINTMENT WE MAY HAVE TO CONSIDER DISCHARGING FROM THE PRACTICE.

## 2016-07-20 NOTE — Telephone Encounter (Signed)
Order faxed.

## 2016-07-20 NOTE — Telephone Encounter (Signed)
Patient is wanting to have fluid drawn off this afternoon. Please advise and call [phone]

## 2016-07-21 ENCOUNTER — Encounter (HOSPITAL_COMMUNITY): Payer: Self-pay

## 2016-07-21 ENCOUNTER — Ambulatory Visit (HOSPITAL_COMMUNITY)
Admission: RE | Admit: 2016-07-21 | Discharge: 2016-07-21 | Disposition: A | Discharge status: Home / Self Care | Payer: Medicare Other | Source: Ambulatory Visit | Attending: Gastroenterology | Admitting: Gastroenterology

## 2016-07-21 DIAGNOSIS — R188 Other ascites: Secondary | ICD-10-CM | POA: Diagnosis not present

## 2016-07-21 LAB — BODY FLUID CELL COUNT WITH DIFFERENTIAL
Eos, Fluid: 0 %
LYMPHS FL: 78 %
Monocyte-Macrophage-Serous Fluid: 20 % — ABNORMAL LOW (ref 50–90)
NEUTROPHIL FLUID: 2 % (ref 0–25)
WBC FLUID: 63 uL (ref 0–1000)

## 2016-07-21 LAB — GRAM STAIN

## 2016-07-21 MED ORDER — ALBUMIN HUMAN 25 % IV SOLN
INTRAVENOUS | Status: AC
  Filled 2016-07-21: qty 50

## 2016-07-21 MED ORDER — ALBUMIN HUMAN 25 % IV SOLN
INTRAVENOUS | Status: AC
  Administered 2016-07-21: 25 g via INTRAVENOUS
  Filled 2016-07-21: qty 50

## 2016-07-21 MED ORDER — ALBUMIN HUMAN 25 % IV SOLN
25.0000 g | Freq: Once | INTRAVENOUS | Status: AC
  Administered 2016-07-21: 25 g via INTRAVENOUS

## 2016-07-21 NOTE — Progress Notes (Signed)
Paracentesis complete no signs of distress. 8600 ml yellow colored ascites removed.

## 2016-07-22 LAB — PATHOLOGIST SMEAR REVIEW

## 2016-07-26 LAB — CULTURE, BODY FLUID-BOTTLE

## 2016-07-26 LAB — CULTURE, BODY FLUID W GRAM STAIN -BOTTLE: Culture: NO GROWTH

## 2016-07-31 ENCOUNTER — Other Ambulatory Visit: Payer: Self-pay

## 2016-07-31 ENCOUNTER — Encounter: Payer: Self-pay | Admitting: Internal Medicine

## 2016-07-31 ENCOUNTER — Ambulatory Visit (INDEPENDENT_AMBULATORY_CARE_PROVIDER_SITE_OTHER): Payer: Medicare Other | Admitting: Internal Medicine

## 2016-07-31 VITALS — BP 100/58 | HR 87 | Temp 97.9°F | Ht 68.0 in | Wt 188.2 lb

## 2016-07-31 DIAGNOSIS — I8501 Esophageal varices with bleeding: Secondary | ICD-10-CM

## 2016-07-31 DIAGNOSIS — K7460 Unspecified cirrhosis of liver: Secondary | ICD-10-CM | POA: Diagnosis not present

## 2016-07-31 DIAGNOSIS — R188 Other ascites: Principal | ICD-10-CM

## 2016-07-31 NOTE — Patient Instructions (Signed)
Chem-12, CBC, INR today  Schedule an EGD to f/u on esophageal varices - propofol  Bring medications with you at your next visit  Because of your liver disease and potential intermittant confusion, you should not drive or operate machinery  Office visit with us in 1 month  Need to get vaccine against Hepatitis A and B ASAP
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
## 2016-07-31 NOTE — Progress Notes (Signed)
e

## 2016-08-03 ENCOUNTER — Ambulatory Visit (HOSPITAL_COMMUNITY)
Admission: RE | Admit: 2016-08-03 | Discharge: 2016-08-03 | Disposition: A | Discharge status: Home / Self Care | Payer: Medicare Other | Source: Ambulatory Visit | Attending: Internal Medicine | Admitting: Internal Medicine

## 2016-08-03 ENCOUNTER — Other Ambulatory Visit: Payer: Self-pay

## 2016-08-03 ENCOUNTER — Encounter (HOSPITAL_COMMUNITY): Payer: Self-pay

## 2016-08-03 ENCOUNTER — Telehealth: Payer: Self-pay

## 2016-08-03 DIAGNOSIS — K7460 Unspecified cirrhosis of liver: Secondary | ICD-10-CM | POA: Insufficient documentation

## 2016-08-03 DIAGNOSIS — R188 Other ascites: Secondary | ICD-10-CM | POA: Diagnosis not present

## 2016-08-03 LAB — BODY FLUID CELL COUNT WITH DIFFERENTIAL
LYMPHS FL: 85 %
MONOCYTE-MACROPHAGE-SEROUS FLUID: 11 % — AB (ref 50–90)
NEUTROPHIL FLUID: 3 % (ref 0–25)
WBC FLUID: 67 uL (ref 0–1000)

## 2016-08-03 NOTE — Progress Notes (Signed)
Paracentesis complete no signs of distress. 9L yellow colored ascites removed.  

## 2016-08-03 NOTE — Telephone Encounter (Signed)
Tried to call pt to inform of pre-op appt but unable to leave message d/t mailbox was full. Letter mailed. Pre-op appt is 08/13/16 at 9:00am.

## 2016-08-04 LAB — GRAM STAIN

## 2016-08-04 LAB — PATHOLOGIST SMEAR REVIEW

## 2016-08-10 ENCOUNTER — Telehealth: Payer: Self-pay | Admitting: Physician Assistant

## 2016-08-10 ENCOUNTER — Encounter: Payer: Self-pay | Admitting: Physician Assistant

## 2016-08-10 ENCOUNTER — Ambulatory Visit (INDEPENDENT_AMBULATORY_CARE_PROVIDER_SITE_OTHER): Payer: Medicare Other | Admitting: Physician Assistant

## 2016-08-10 VITALS — BP 117/70 | HR 126 | Temp 97.5°F | Ht 68.0 in | Wt 188.0 lb

## 2016-08-10 DIAGNOSIS — I8501 Esophageal varices with bleeding: Secondary | ICD-10-CM

## 2016-08-10 DIAGNOSIS — R188 Other ascites: Secondary | ICD-10-CM

## 2016-08-10 DIAGNOSIS — K7460 Unspecified cirrhosis of liver: Secondary | ICD-10-CM

## 2016-08-10 DIAGNOSIS — J9601 Acute respiratory failure with hypoxia: Secondary | ICD-10-CM

## 2016-08-10 DIAGNOSIS — N185 Chronic kidney disease, stage 5: Secondary | ICD-10-CM | POA: Diagnosis not present

## 2016-08-10 MED ORDER — ALBUTEROL SULFATE HFA 108 (90 BASE) MCG/ACT IN AERS: 2.0000 | INHALATION_SPRAY | Freq: Four times a day (QID) | RESPIRATORY_TRACT | 6 refills | Status: DC | PRN
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
## 2016-08-10 NOTE — Patient Instructions (Signed)
Lactulose oral solution What is this medicine? LACTULOSE (LAK tyoo lose) is a laxative derived from lactose. It helps to treat chronic constipation and to treat or prevent hepatic encephalopathy or coma. These are brain disorders that result from liver disease. This medicine may be used for other purposes; ask your health care provider or pharmacist if you have questions. What should I tell my health care provider before I take this medicine? They need to know if you have any of these conditions: -need a galactose-free diet -scheduled for surgery -an unusual or allergic reaction to lactulose, other sugars, medicines, foods, dyes or preservatives -pregnant or trying to get pregnant -breast-feeding How should I use this medicine? Take this medicine mouth. Follow the directions on the prescription label. Shake well before using. Use a specially marked spoon or container to measure your medicine. Ask your pharmacist if you do not have one. Household spoons are not accurate. Take your doses at regular intervals. Do not take your medicine more often than directed. You may be directed to take this medicine rectally. If so, you must follow specific directions from your doctor or healthcare professional. Please contact him or her. Talk to your pediatrician regarding the use of this medicine in children. Special care may be needed. Overdosage: If you think you have taken too much of this medicine contact a poison control center or emergency room at once. NOTE: This medicine is only for you. Do not share this medicine with others. What if I miss a dose? If you miss a dose, take it as soon as you can. If it is almost time for your next dose, take only that dose. Do not take double or extra doses. What may interact with this medicine? -antacids -neomycin -other laxatives This list may not describe all possible interactions. Give your health care provider a list of all the medicines, herbs, non-prescription  drugs, or dietary supplements you use. Also tell them if you smoke, drink alcohol, or use illegal drugs. Some items may interact with your medicine. What should I watch for while using this medicine? This medicine may not produce any result for 24 to 48 hours. Do not take this medicine for longer than directed by your doctor or health care professional. Drink plenty of water with each dose of this medicine. What side effects may I notice from receiving this medicine? Side effects that you should report to your doctor or health care professional as soon as possible: -diarrhea Side effects that usually do not require medical attention (report to your doctor or health care professional if they continue or are bothersome): -belching, flatulence -nausea or vomiting -stomach pain or discomfort This list may not describe all possible side effects. Call your doctor for medical advice about side effects. You may report side effects to FDA at 1-800-FDA-1088. Where should I keep my medicine? Keep out of the reach of children. This medicine may darken in color under normal storage conditions. This is because of the sugar solution and does not affect the way the medicine works. If the solution becomes extremely dark in color, contact your health care professional before use. Store at room temperature between 15 and 30 degrees C (59 and 86 degrees F). Do not freeze. Keep container tightly closed. Throw away any unused medicine after the expiration date. NOTE: This sheet is a summary. It may not cover all possible information. If you have questions about this medicine, talk to your doctor, pharmacist, or health care provider.    2016,  Elsevier/Gold Standard. (2008-03-20 16:04:57)

## 2016-08-11 ENCOUNTER — Other Ambulatory Visit: Payer: Self-pay

## 2016-08-11 ENCOUNTER — Telehealth: Payer: Self-pay

## 2016-08-11 DIAGNOSIS — R188 Other ascites: Secondary | ICD-10-CM

## 2016-08-11 NOTE — Telephone Encounter (Signed)
Pt is calling to have a US PARA.Is it ok to keep use the standing order from May with the labs and albumin?

## 2016-08-11 NOTE — Telephone Encounter (Signed)
Patient needs to have his labs done that were ordered by RMR.  You can update standing order for U/S guided paracentesis to cover next six months.   He needs to have IV albumin 25 grams at start of paracentesis. For each additional 2 liters of fluid removed beyond the first 4 liters, please give another 12.5 grams IV albumin.   Send fluid for cell count with diff and cultures only. NO CYTOLOGY.

## 2016-08-11 NOTE — Telephone Encounter (Signed)
Recommend getting a B Complex vitamin and take every day.

## 2016-08-12 ENCOUNTER — Encounter (HOSPITAL_COMMUNITY): Payer: Self-pay

## 2016-08-12 ENCOUNTER — Ambulatory Visit (HOSPITAL_COMMUNITY)
Admission: RE | Admit: 2016-08-12 | Discharge: 2016-08-12 | Disposition: A | Discharge status: Home / Self Care | Payer: Medicare Other | Source: Ambulatory Visit | Attending: Gastroenterology | Admitting: Gastroenterology

## 2016-08-12 DIAGNOSIS — R188 Other ascites: Secondary | ICD-10-CM

## 2016-08-12 LAB — BODY FLUID CELL COUNT WITH DIFFERENTIAL
EOS FL: 0 %
Lymphs, Fluid: 76 %
MONOCYTE-MACROPHAGE-SEROUS FLUID: 23 % — AB (ref 50–90)
Neutrophil Count, Fluid: 1 % (ref 0–25)
Total Nucleated Cell Count, Fluid: 63 cu mm (ref 0–1000)

## 2016-08-12 LAB — GRAM STAIN

## 2016-08-12 MED ORDER — ALBUMIN HUMAN 25 % IV SOLN
INTRAVENOUS | Status: AC
  Administered 2016-08-12: 50 g via INTRAVENOUS
  Filled 2016-08-12: qty 200

## 2016-08-12 MED ORDER — ALBUMIN HUMAN 25 % IV SOLN
50.0000 g | Freq: Once | INTRAVENOUS | Status: AC
  Administered 2016-08-12: 50 g via INTRAVENOUS

## 2016-08-12 NOTE — Patient Instructions (Signed)
Darl HouseholderRoy L Buchmann  08/12/2016     @PREFPERIOPPHARMACY @   Your procedure is scheduled on  08/17/2016   [facility]  Call this number if you have problems the morning of surgery:  [phone]   Remember:  Do not eat food or drink liquids after midnight.  Take these medicines the morning of surgery with A SIP OF WATER  Xanax, neurontin, levothyroxine, protonix, oxycodone, altace, flomax, zanaflex. Use your inhaler before you come and bring it with you. Take 1/2 of your usual night time insulin. DO NOT take any medicines for diabetes the morning of your procedure.   Do not wear jewelry, make-up or nail polish.  Do not wear lotions, powders, or perfumes, or deoderant.  Do not shave 48 hours prior to surgery.  Men may shave face and neck.  Do not bring valuables to the hospital.  [facility] any belongings or valuables.  Contacts, dentures or bridgework may not be worn into surgery.  Leave your suitcase in the car.  After surgery it may be brought to your room.  For patients admitted to the hospital, discharge time will be determined by your treatment team.  Patients discharged the day of surgery will not be allowed to drive home.   Name and phone number of your driver:   family Special instructions:  Follow the diet instructions given to you by Dr Luvenia Starchourk's office.  Please read over the following fact sheets that you were given. Anesthesia Post-op Instructions and Care and Recovery After Surgery       Esophagogastroduodenoscopy Introduction Esophagogastroduodenoscopy (EGD) is a procedure to examine the lining of the esophagus, stomach, and first part of the small intestine (duodenum). This procedure is done to check for problems such as inflammation, bleeding, ulcers, or growths. During this procedure, a long, flexible, lighted tube with a camera attached (endoscope) is inserted down the throat. Tell a health care provider  about:  Any allergies you have.  All medicines you are taking, including vitamins, herbs, eye drops, creams, and over-the-counter medicines.  Any problems you or family members have had with anesthetic medicines.  Any blood disorders you have.  Any surgeries you have had.  Any medical conditions you have.  Whether you are pregnant or may be pregnant. What are the risks? Generally, this is a safe procedure. However, problems may occur, including:  Infection.  Bleeding.  A tear (perforation) in the esophagus, stomach, or duodenum.  Trouble breathing.  Excessive sweating.  Spasms of the larynx.  A slowed heartbeat.  Low blood pressure. What happens before the procedure?  Follow instructions from your health care provider about eating or drinking restrictions.  Ask your health care provider about:  Changing or stopping your regular medicines. This is especially important if you are taking diabetes medicines or blood thinners.  Taking medicines such as aspirin and ibuprofen. These medicines can thin your blood. Do not take these medicines before your procedure if your health care provider instructs you not to.  Plan to have someone take you home after the procedure.  If you wear dentures, be ready to remove them before the procedure. What happens during the procedure?  To reduce your risk of infection, your health care team will wash or sanitize their hands.  An IV tube will be put in a vein in your hand or arm. You will get medicines and fluids through this  tube.  You will be given one or more of the following:  A medicine to help you relax (sedative).  A medicine to numb the area (local anesthetic). This medicine may be sprayed into your throat. It will make you feel more comfortable and keep you from gagging or coughing during the procedure.  A medicine for pain.  A mouth guard may be placed in your mouth to protect your teeth and to keep you from biting on  the endoscope.  You will be asked to lie on your left side.  The endoscope will be lowered down your throat into your esophagus, stomach, and duodenum.  Air will be put into the endoscope. This will help your health care provider see better.  The lining of your esophagus, stomach, and duodenum will be examined.  Your health care provider may:  Take a tissue sample so it can be looked at in a lab (biopsy).  Remove growths.  Remove objects (foreign bodies) that are stuck.  Treat any bleeding with medicines or other devices that stop tissue from bleeding.  Widen (dilate) or stretch narrowed areas of your esophagus and stomach.  The endoscope will be taken out. The procedure may vary among health care providers and hospitals. What happens after the procedure?  Your blood pressure, heart rate, breathing rate, and blood oxygen level will be monitored often until the medicines you were given have worn off.  Do not eat or drink anything until the numbing medicine has worn off and your gag reflex has returned. This information is not intended to replace advice given to you by your health care provider. Make sure you discuss any questions you have with your health care provider. Document Released: 01/15/2005 Document Revised: 02/20/2016 Document Reviewed: 08/08/2015  2017 Elsevier Esophagogastroduodenoscopy, Care After Introduction Refer to this sheet in the next few weeks. These instructions provide you with information about caring for yourself after your procedure. Your health care provider may also give you more specific instructions. Your treatment has been planned according to current medical practices, but problems sometimes occur. Call your health care provider if you have any problems or questions after your procedure. What can I expect after the procedure? After the procedure, it is common to have:  A sore throat.  Nausea.  Bloating.  Dizziness.  Fatigue. Follow these  instructions at home:  Do not eat or drink anything until the numbing medicine (local anesthetic) has worn off and your gag reflex has returned. You will know that the local anesthetic has worn off when you can swallow comfortably.  Do not drive for 24 hours if you received a medicine to help you relax (sedative).  If your health care provider took a tissue sample for testing during the procedure, make sure to get your test results. This is your responsibility. Ask your health care provider or the department performing the test when your results will be ready.  Keep all follow-up visits as told by your health care provider. This is important. Contact a health care provider if:  You cannot stop coughing.  You are not urinating.  You are urinating less than usual. Get help right away if:  You have trouble swallowing.  You cannot eat or drink.  You have throat or chest pain that gets worse.  You are dizzy or light-headed.  You faint.  You have nausea or vomiting.  You have chills.  You have a fever.  You have severe abdominal pain.  You have black, tarry, or  bloody stools. This information is not intended to replace advice given to you by your health care provider. Make sure you discuss any questions you have with your health care provider. Document Released: 08/31/2012 Document Revised: 02/20/2016 Document Reviewed: 08/08/2015  2017 Elsevier  Monitored Anesthesia Care Anesthesia is a term that refers to techniques, procedures, and medicines that help a person stay safe and comfortable during a medical procedure. Monitored anesthesia care, or sedation, is one type of anesthesia. Your anesthesia specialist may recommend sedation if you will be having a procedure that does not require you to be unconscious, such as:  Cataract surgery.  A dental procedure.  A biopsy.  A colonoscopy. During the procedure, you may receive a medicine to help you relax (sedative). There are  three levels of sedation:  Mild sedation. At this level, you may feel awake and relaxed. You will be able to follow directions.  Moderate sedation. At this level, you will be sleepy. You may not remember the procedure.  Deep sedation. At this level, you will be asleep. You will not remember the procedure. The more medicine you are given, the deeper your level of sedation will be. Depending on how you respond to the procedure, the anesthesia specialist may change your level of sedation or the type of anesthesia to fit your needs. An anesthesia specialist will monitor you closely during the procedure. Let your health care provider know about:  Any allergies you have.  All medicines you are taking, including vitamins, herbs, eye drops, creams, and over-the-counter medicines.  Any use of steroids (by mouth or as a cream).  Any problems you or family members have had with sedatives and anesthetic medicines.  Any blood disorders you have.  Any surgeries you have had.  Any medical conditions you have, such as sleep apnea.  Whether you are pregnant or may be pregnant.  Any use of cigarettes, alcohol, or street drugs. What are the risks? Generally, this is a safe procedure. However, problems may occur, including:  Getting too much medicine (oversedation).  Nausea.  Allergic reaction to medicines.  Trouble breathing. If this happens, a breathing tube may be used to help with breathing. It will be removed when you are awake and breathing on your own.  Heart trouble.  Lung trouble. Before the procedure Staying hydrated  Follow instructions from your health care provider about hydration, which may include:  Up to 2 hours before the procedure - you may continue to drink clear liquids, such as water, clear fruit juice, black coffee, and plain tea. Eating and drinking restrictions  Follow instructions from your health care provider about eating and drinking, which may include:  8  hours before the procedure - stop eating heavy meals or foods such as meat, fried foods, or fatty foods.  6 hours before the procedure - stop eating light meals or foods, such as toast or cereal.  6 hours before the procedure - stop drinking milk or drinks that contain milk.  2 hours before the procedure - stop drinking clear liquids. Medicines  Ask your health care provider about:  Changing or stopping your regular medicines. This is especially important if you are taking diabetes medicines or blood thinners.  Taking medicines such as aspirin and ibuprofen. These medicines can thin your blood. Do not take these medicines before your procedure if your health care provider instructs you not to. Tests and exams  You will have a physical exam.  You may have blood tests done to show:  How well your kidneys and liver are working.  How well your blood can clot.  General instructions  Plan to have someone take you home from the hospital or clinic.  If you will be going home right after the procedure, plan to have someone with you for 24 hours. What happens during the procedure?  Your blood pressure, heart rate, breathing, level of pain and overall condition will be monitored.  An IV tube will be inserted into one of your veins.  Your anesthesia specialist will give you medicines as needed to keep you comfortable during the procedure. This may mean changing the level of sedation.  The procedure will be performed. After the procedure  Your blood pressure, heart rate, breathing rate, and blood oxygen level will be monitored until the medicines you were given have worn off.  Do not drive for 24 hours if you received a sedative.  You may:  Feel sleepy, clumsy, or nauseous.  Feel forgetful about what happened after the procedure.  Have a sore throat if you had a breathing tube during the procedure.  Vomit. This information is not intended to replace advice given to you by your  health care provider. Make sure you discuss any questions you have with your health care provider. Document Released: 06/10/2005 Document Revised: 02/21/2016 Document Reviewed: 01/05/2016 Elsevier Interactive Patient Education  2017 [employer] PATIENT INSTRUCTIONS POST-ANESTHESIA  IMMEDIATELY FOLLOWING SURGERY:  Do not drive or operate machinery for the first twenty four hours after surgery.  Do not make any important decisions for twenty four hours after surgery or while taking narcotic pain medications or sedatives.  If you develop intractable nausea and vomiting or a severe headache please notify your doctor immediately.  FOLLOW-UP:  Please make an appointment with your surgeon as instructed. You do not need to follow up with anesthesia unless specifically instructed to do so.  WOUND CARE INSTRUCTIONS (if applicable):  Keep a dry clean dressing on the anesthesia/puncture wound site if there is drainage.  Once the wound has quit draining you may leave it open to air.  Generally you should leave the bandage intact for twenty four hours unless there is drainage.  If the epidural site drains for more than 36-48 hours please call the anesthesia department.  QUESTIONS?:  Please feel free to call your physician or the hospital operator if you have any questions, and they will be happy to assist you.

## 2016-08-12 NOTE — Procedures (Signed)
Uncomplicated US guided paracentesis for symptom control.  JWatts MD 

## 2016-08-12 NOTE — Progress Notes (Signed)
Paracentesis complete no signs of distress. 9L yellow colored ascites removed.  

## 2016-08-13 ENCOUNTER — Encounter (HOSPITAL_COMMUNITY): Payer: Self-pay

## 2016-08-13 ENCOUNTER — Encounter (HOSPITAL_COMMUNITY)
Admission: RE | Admit: 2016-08-13 | Discharge: 2016-08-13 | Disposition: A | Discharge status: Home / Self Care | Payer: Medicare Other | Source: Ambulatory Visit | Attending: Internal Medicine | Admitting: Internal Medicine

## 2016-08-13 DIAGNOSIS — Z01812 Encounter for preprocedural laboratory examination: Secondary | ICD-10-CM | POA: Insufficient documentation

## 2016-08-13 DIAGNOSIS — Z01818 Encounter for other preprocedural examination: Secondary | ICD-10-CM | POA: Diagnosis present

## 2016-08-13 LAB — CBC WITH DIFFERENTIAL/PLATELET
BASOS ABS: 0.1 10*3/uL (ref 0.0–0.1)
BASOS PCT: 1 %
EOS ABS: 0.2 10*3/uL (ref 0.0–0.7)
EOS PCT: 3 %
HCT: 25.7 % — ABNORMAL LOW (ref 39.0–52.0)
HEMOGLOBIN: 8.9 g/dL — AB (ref 13.0–17.0)
LYMPHS ABS: 1.5 10*3/uL (ref 0.7–4.0)
Lymphocytes Relative: 23 %
MCH: 31 pg (ref 26.0–34.0)
MCHC: 34.6 g/dL (ref 30.0–36.0)
MCV: 89.5 fL (ref 78.0–100.0)
Monocytes Absolute: 0.7 10*3/uL (ref 0.1–1.0)
Monocytes Relative: 11 %
NEUTROS PCT: 62 %
Neutro Abs: 4 10*3/uL (ref 1.7–7.7)
PLATELETS: 81 10*3/uL — AB (ref 150–400)
RBC: 2.87 MIL/uL — AB (ref 4.22–5.81)
RDW: 15 % (ref 11.5–15.5)
WBC: 6.4 10*3/uL (ref 4.0–10.5)

## 2016-08-13 LAB — BASIC METABOLIC PANEL
Anion gap: 6 (ref 5–15)
BUN: 52 mg/dL — AB (ref 6–20)
CHLORIDE: 99 mmol/L — AB (ref 101–111)
CO2: 22 mmol/L (ref 22–32)
CREATININE: 3.6 mg/dL — AB (ref 0.61–1.24)
Calcium: 8.9 mg/dL (ref 8.9–10.3)
GFR, EST AFRICAN AMERICAN: 19 mL/min — AB (ref >60)
GFR, EST NON AFRICAN AMERICAN: 16 mL/min — AB (ref >60)
Glucose, Bld: 146 mg/dL — ABNORMAL HIGH (ref 65–99)
POTASSIUM: 5.3 mmol/L — AB (ref 3.5–5.1)
SODIUM: 127 mmol/L — AB (ref 135–145)

## 2016-08-13 LAB — PATHOLOGIST SMEAR REVIEW

## 2016-08-13 NOTE — Pre-Procedure Instructions (Signed)
Dr Jayme CloudGonzalez and Dr Jena Gaussourk notified of potassium 5.3. Dr Jena Gaussourk has contacted patient to stop his spironolactone and wants IStat on arrival day of procedure.

## 2016-08-14 ENCOUNTER — Other Ambulatory Visit: Payer: Self-pay | Admitting: Internal Medicine

## 2016-08-14 DIAGNOSIS — E875 Hyperkalemia: Secondary | ICD-10-CM

## 2016-08-17 ENCOUNTER — Ambulatory Visit (HOSPITAL_COMMUNITY)
Admission: RE | Admit: 2016-08-17 | Discharge: 2016-08-17 | Disposition: A | Discharge status: Home / Self Care | Payer: Medicare Other | Source: Ambulatory Visit | Attending: Internal Medicine | Admitting: Internal Medicine

## 2016-08-17 ENCOUNTER — Encounter (HOSPITAL_COMMUNITY)
Admission: RE | Disposition: A | Discharge status: Home / Self Care | Payer: Self-pay | Source: Ambulatory Visit | Attending: Internal Medicine

## 2016-08-17 ENCOUNTER — Ambulatory Visit (HOSPITAL_COMMUNITY): Payer: Medicare Other | Admitting: Anesthesiology

## 2016-08-17 DIAGNOSIS — Z89412 Acquired absence of left great toe: Secondary | ICD-10-CM | POA: Diagnosis not present

## 2016-08-17 DIAGNOSIS — E7800 Pure hypercholesterolemia, unspecified: Secondary | ICD-10-CM | POA: Diagnosis not present

## 2016-08-17 DIAGNOSIS — J449 Chronic obstructive pulmonary disease, unspecified: Secondary | ICD-10-CM | POA: Diagnosis not present

## 2016-08-17 DIAGNOSIS — Z79899 Other long term (current) drug therapy: Secondary | ICD-10-CM | POA: Insufficient documentation

## 2016-08-17 DIAGNOSIS — K7030 Alcoholic cirrhosis of liver without ascites: Secondary | ICD-10-CM | POA: Insufficient documentation

## 2016-08-17 DIAGNOSIS — E119 Type 2 diabetes mellitus without complications: Secondary | ICD-10-CM | POA: Diagnosis not present

## 2016-08-17 DIAGNOSIS — Z791 Long term (current) use of non-steroidal anti-inflammatories (NSAID): Secondary | ICD-10-CM | POA: Insufficient documentation

## 2016-08-17 DIAGNOSIS — I8500 Esophageal varices without bleeding: Secondary | ICD-10-CM | POA: Diagnosis not present

## 2016-08-17 DIAGNOSIS — E039 Hypothyroidism, unspecified: Secondary | ICD-10-CM | POA: Insufficient documentation

## 2016-08-17 DIAGNOSIS — Z794 Long term (current) use of insulin: Secondary | ICD-10-CM | POA: Diagnosis not present

## 2016-08-17 DIAGNOSIS — Z87891 Personal history of nicotine dependence: Secondary | ICD-10-CM | POA: Diagnosis not present

## 2016-08-17 DIAGNOSIS — I8501 Esophageal varices with bleeding: Secondary | ICD-10-CM

## 2016-08-17 DIAGNOSIS — I10 Essential (primary) hypertension: Secondary | ICD-10-CM | POA: Insufficient documentation

## 2016-08-17 HISTORY — PX: ESOPHAGOGASTRODUODENOSCOPY (EGD) WITH PROPOFOL: SHX5813

## 2016-08-17 LAB — CULTURE, BODY FLUID W GRAM STAIN -BOTTLE: Culture: NO GROWTH

## 2016-08-17 LAB — POCT I-STAT 4, (NA,K, GLUC, HGB,HCT)
Glucose, Bld: 95 mg/dL (ref 65–99)
HEMATOCRIT: 29 % — AB (ref 39.0–52.0)
Hemoglobin: 9.9 g/dL — ABNORMAL LOW (ref 13.0–17.0)
Potassium: 5.1 mmol/L (ref 3.5–5.1)
Sodium: 135 mmol/L (ref 135–145)

## 2016-08-17 LAB — CULTURE, BODY FLUID-BOTTLE: CULTURE: NO GROWTH

## 2016-08-17 SURGERY — ESOPHAGOGASTRODUODENOSCOPY (EGD) WITH PROPOFOL
Anesthesia: Monitor Anesthesia Care

## 2016-08-17 MED ORDER — MIDAZOLAM HCL 2 MG/2ML IJ SOLN
INTRAMUSCULAR | Status: AC
  Filled 2016-08-17: qty 2

## 2016-08-17 MED ORDER — FENTANYL CITRATE (PF) 100 MCG/2ML IJ SOLN
INTRAMUSCULAR | Status: AC
  Filled 2016-08-17: qty 2

## 2016-08-17 MED ORDER — SODIUM CHLORIDE 0.9 % IJ SOLN
INTRAMUSCULAR | Status: AC
  Filled 2016-08-17: qty 10

## 2016-08-17 MED ORDER — EPHEDRINE SULFATE 50 MG/ML IJ SOLN
INTRAMUSCULAR | Status: AC
  Filled 2016-08-17: qty 1

## 2016-08-17 MED ORDER — LIDOCAINE VISCOUS 2 % MT SOLN
15.0000 mL | Freq: Once | OROMUCOSAL | Status: AC
  Administered 2016-08-17: 15 mL via OROMUCOSAL

## 2016-08-17 MED ORDER — LACTATED RINGERS IV SOLN
INTRAVENOUS | Status: DC
  Administered 2016-08-17: 07:00:00 via INTRAVENOUS

## 2016-08-17 MED ORDER — SODIUM CHLORIDE 0.9% FLUSH
INTRAVENOUS | Status: AC
  Filled 2016-08-17: qty 10

## 2016-08-17 MED ORDER — CHLORHEXIDINE GLUCONATE CLOTH 2 % EX PADS: 6.0000 | MEDICATED_PAD | Freq: Once | CUTANEOUS | Status: DC

## 2016-08-17 MED ORDER — PROPOFOL 10 MG/ML IV BOLUS
INTRAVENOUS | Status: AC
  Filled 2016-08-17: qty 40

## 2016-08-17 MED ORDER — ONDANSETRON HCL 4 MG/2ML IJ SOLN
INTRAMUSCULAR | Status: DC | PRN
  Administered 2016-08-17: 4 mg via INTRAVENOUS

## 2016-08-17 MED ORDER — MIDAZOLAM HCL 2 MG/2ML IJ SOLN
1.0000 mg | INTRAMUSCULAR | Status: DC | PRN
  Administered 2016-08-17: 2 mg via INTRAVENOUS
  Filled 2016-08-17: qty 2

## 2016-08-17 MED ORDER — PROPOFOL 500 MG/50ML IV EMUL
INTRAVENOUS | Status: DC | PRN
  Administered 2016-08-17: 40 ug/kg/min via INTRAVENOUS

## 2016-08-17 MED ORDER — FENTANYL CITRATE (PF) 100 MCG/2ML IJ SOLN
25.0000 ug | Freq: Once | INTRAMUSCULAR | Status: AC
  Administered 2016-08-17: 25 ug via INTRAVENOUS

## 2016-08-17 MED ORDER — LIDOCAINE VISCOUS 2 % MT SOLN
OROMUCOSAL | Status: AC
Start: 2016-08-17 — End: 2016-08-17
  Filled 2016-08-17: qty 15

## 2016-08-17 NOTE — Anesthesia Preprocedure Evaluation (Signed)
Anesthesia Evaluation  Patient identified by MRN, date of birth, ID band Patient awake    Reviewed: Allergy & Precautions, H&P , NPO status , Patient's Chart, lab work & pertinent test results  Airway Mallampati: II  TM Distance: >3 FB     Dental  (+) Edentulous Upper, Edentulous Lower   Pulmonary COPD (am cough), former smoker,    breath sounds clear to auscultation       Cardiovascular hypertension, Pt. on medications  Rhythm:Regular Rate:Normal     Neuro/Psych    GI/Hepatic negative GI ROS, (+) Cirrhosis  (portal htn)  Esophageal Varices and ascites    ,   Endo/Other  diabetes, Well Controlled, Type 2, Insulin DependentHypothyroidism   Renal/GU Renal InsufficiencyRenal disease     Musculoskeletal   Abdominal   Peds  Hematology  (+) Blood dyscrasia, ,   Anesthesia Other Findings   Reproductive/Obstetrics                             Anesthesia Physical Anesthesia Plan  ASA: III  Anesthesia Plan: MAC   Post-op Pain Management:    Induction: Intravenous  Airway Management Planned: Simple Face Mask  Additional Equipment:   Intra-op Plan:   Post-operative Plan:   Informed Consent: I have reviewed the patients History and Physical, chart, labs and discussed the procedure including the risks, benefits and alternatives for the proposed anesthesia with the patient or authorized representative who has indicated his/her understanding and acceptance.     Plan Discussed with:   Anesthesia Plan Comments:         Anesthesia Quick Evaluation

## 2016-08-17 NOTE — Transfer of Care (Signed)
Immediate Anesthesia Transfer of Care Note  Patient: [name] [name]  Procedure(s) Performed: Procedure(s) with comments: ESOPHAGOGASTRODUODENOSCOPY (EGD) WITH PROPOFOL (N/A) - 7:30am  Patient Location: PACU  Anesthesia Type:MAC  Level of Consciousness: awake  Airway & Oxygen Therapy: Patient Spontanous Breathing and Patient connected to face mask oxygen  Post-op Assessment: Report given to RN and Post -op Vital signs reviewed and stable  Post vital signs: Reviewed and stable  Last Vitals:  Vitals:   08/17/16 0730 08/17/16 0735  BP:    Pulse:    Resp: 12 13  Temp:      Last Pain:  Vitals:   08/17/16 0721  TempSrc: Oral      Patients Stated Pain Goal: 5 (08/17/16 0721)  Complications: No apparent anesthesia complications

## 2016-08-17 NOTE — H&P (View-Only) (Signed)
e

## 2016-08-17 NOTE — Anesthesia Postprocedure Evaluation (Signed)
Anesthesia Post Note  Patient: Darl HouseholderRoy L Guerrini  Procedure(s) Performed: Procedure(s) (LRB): ESOPHAGOGASTRODUODENOSCOPY (EGD) WITH PROPOFOL (N/A)  Patient location during evaluation: PACU Anesthesia Type: MAC Level of consciousness: awake and patient cooperative Pain management: pain level controlled Vital Signs Assessment: post-procedure vital signs reviewed and stable Respiratory status: spontaneous breathing, nonlabored ventilation, respiratory function stable and patient connected to face mask oxygen Cardiovascular status: blood pressure returned to baseline Postop Assessment: no signs of nausea or vomiting Anesthetic complications: no    Last Vitals:  Vitals:   08/17/16 0730 08/17/16 0735  BP:    Pulse:    Resp: 12 13  Temp:      Last Pain:  Vitals:   08/17/16 0721  TempSrc: Oral                 Deosha Werden J

## 2016-08-17 NOTE — Interval H&P Note (Signed)
History and Physical Interval Note:  08/17/2016 7:19 AM  Darl Householderoy L Sheer  has presented today for surgery, with the diagnosis of esophageal varices  The various methods of treatment have been discussed with the patient and family. After consideration of risks, benefits and other options for treatment, the patient has consented to  Procedure(s) with comments: ESOPHAGOGASTRODUODENOSCOPY (EGD) WITH PROPOFOL (N/A) - 7:30am as a surgical intervention .  The patient's history has been reviewed, patient examined, no change in status, stable for surgery.  I have reviewed the patient's chart and labs.  Questions were answered to the patient's satisfaction.      No change since last OV; paracentesis done since last OV (neg for SBP);    Plan for EGD w possible EBL today.  The risks, benefits, limitations, alternatives and imponderables have been reviewed with the patient. Potential for esophageal dilation, biopsy, etc. have also been reviewed.  Questions have been answered. All parties agreeable.  Eula Listenobert Rourk

## 2016-08-17 NOTE — Discharge Instructions (Signed)
Clear Liquid Diet A clear liquid diet means that you only have liquids that you can see through. You do not eat any food on this diet. Most people need to follow this diet for only a short time. What do I need to know about this diet? A clear liquid is a liquid that you can see through when you hold it up to a light. This diet does not give you all the nutrients that you need. Choose a variety of the liquids that your doctor says you can drink on this diet. That way, you will get as many nutrients as possible. If you are not sure whether you can have certain items, ask your doctor. What can I have? Water and flavored water. Fruit juices that do not have pulp, such as cranberry juice and apple juice. Tea and coffee without milk or cream. Clear bouillon or broth. Broth-based soups that have been strained. Flavored gelatins. Honey. Sugar water. Frozen ice or frozen ice pops that do not have any milk, yogurt, fruit pieces, or fruit pulp in them. Clear sodas. Clear sports drinks. The items listed above may not be a complete list of recommended liquids. Contact your food and nutrition expert (dietitian) for more options.  What can I not have? Juices that have pulp. Milk. Cream or cream-based soups. Yogurt. The items listed above may not be a complete list of liquids to avoid. Contact your food and nutrition expert for more information.  Summary A clear liquid diet is a diet that includes only liquids that you can see through. The goal of this diet is to help you recover. Make sure to avoid liquids with milk, cream, or pulp while you are on this diet. This information is not intended to replace advice given to you by your health care provider. Make sure you discuss any questions you have with your health care provider. Document Released: 08/27/2008 Document Revised: 04/27/2016 Document Reviewed: 08/11/2013 Elsevier Interactive Patient Education  2017 [employer] EGD Discharge  instructions Please read the instructions outlined below and refer to this sheet in the next few weeks. These discharge instructions provide you with general information on caring for yourself after you leave the hospital. Your doctor may also give you specific instructions. While your treatment has been planned according to the most current medical practices available, unavoidable complications occasionally occur. If you have any problems or questions after discharge, please call your doctor. ACTIVITY  You may resume your regular activity but move at a slower pace for the next 24 hours.   Take frequent rest periods for the next 24 hours.   Walking will help expel (get rid of) the air and reduce the bloated feeling in your abdomen.   No driving for 24 hours (because of the anesthesia (medicine) used during the test).   You may shower.   Do not sign any important legal documents or operate any machinery for 24 hours (because of the anesthesia used during the test).  NUTRITION  Drink plenty of fluids.   You may resume your normal diet.   Begin with a light meal and progress to your normal diet.   Avoid alcoholic beverages for 24 hours or as instructed by your caregiver.  MEDICATIONS  You may resume your normal medications unless your caregiver tells you otherwise.  WHAT YOU CAN EXPECT TODAY  You may experience abdominal discomfort such as a feeling of fullness or gas pains.  FOLLOW-UP  Your doctor will discuss the results of  your test with you.  SEEK IMMEDIATE MEDICAL ATTENTION IF ANY OF THE FOLLOWING OCCUR:  Excessive nausea (feeling sick to your stomach) and/or vomiting.   Severe abdominal pain and distention (swelling).   Trouble swallowing.   Temperature over 101 F (37.8 C).   Rectal bleeding or vomiting of blood.   Continue present medications; Would minimize use of Mobic  Clinical liquid lunch then soft supper today.  Advance to a 2 g sodium diet  tomorrow  On this visit with us in 2 weeks

## 2016-08-17 NOTE — Op Note (Signed)
NAMLenard [name]:  [name], [name]                  ACCOUNT NO.:  [id number]  MEDICAL RECORD NO.:  [id number]  LOCATION:                                 FACILITY:  PHYSICIAN:  R. Roetta SessionsMichael Ronalda Walpole, MD FACP FACGDATE OF BIRTH:  May 16, 1951  DATE OF PROCEDURE:  08/17/2016 DATE OF DISCHARGE:                              OPERATIVE REPORT   PROCEDURE:  EGD with esophageal band ligation therapy.  INDICATIONS FOR PROCEDURE:  The patient is a 65 year old gentleman with advanced EtOH related cirrhosis.  History of variceal bleed over the summer status [place].  He is here for surveillance examination, placement of additional bands as appropriate.  Risks, benefits, limitations, alternatives, and imponderables have been discussed with the patient's family members.  DESCRIPTION OF PROCEDURE:  Deep sedation was induced by Dr. Marcos EkeGonzales and associates.  INSTRUMENT:  Pentax video chip system.  TOTAL SERVICE TIME:  18 minutes.  FINDINGS:  Examination of tubular esophagus revealed 4 columns of grade 2 to 3 esophageal varices with no bleeding stigmata.  EG junction was easily traversed.  Stomach:  Gastric cavity was emptied and insufflated well with air. Thorough exam of the gastric mucosa including retroflexed view of the proximal stomach.  Esophagogastric junction demonstrated prominent changes consistent with portal gastropathy only.  Pylorus was patent, easily traversed.  Examination of the bulb and second portion revealed no abnormalities.  THERAPEUTIC/DIAGNOSTIC MANEUVERS PERFORMED:  The scope was withdrawn and the Microvasive 7-shot bander was placed on the end of the scope.  It was reintroduced in the esophagus.  1 band was placed on each of the 4 columns of varices.  Three additional bands were placed on the 2 columns.  They were grade 3 in size.  Good hemostasis maintained throughout the procedure.  The patient tolerated the procedure well and was taken to PACU  in stable condition.  IMPRESSION: 1. Persisting grade 2 to grade 3 esophageal varices status post     esophageal band ligation as described above. 2. Portal gastropathy.  RECOMMENDATIONS: 1. Continue nadolol. 2. Clear liquid followed by soft diet this evening.  Advance slowly to     a 2 g sodium diet beginning tomorrow. 3. Office visit with us in 2 weeks.     Jonathon Bellows. Michael Dickie Cloe, MD FACP FACG   ______________________________ R. Roetta SessionsMichael Teirra Carapia, MD Caleen EssexFACP FACG    RMR/MEDQ  D:  08/17/2016  T:  08/17/2016  Job:  [id number]  cc:   Prudy FeelerAngel Jones, [place] Fax: [phone]

## 2016-08-21 ENCOUNTER — Telehealth: Payer: Self-pay | Admitting: Physician Assistant

## 2016-08-22 ENCOUNTER — Other Ambulatory Visit: Payer: Self-pay | Admitting: Nurse Practitioner

## 2016-08-24 ENCOUNTER — Other Ambulatory Visit: Payer: Self-pay | Admitting: *Deleted

## 2016-08-24 ENCOUNTER — Encounter (HOSPITAL_COMMUNITY): Payer: Self-pay | Admitting: Internal Medicine

## 2016-08-24 DIAGNOSIS — M5136 Other intervertebral disc degeneration, lumbar region: Secondary | ICD-10-CM

## 2016-08-24 NOTE — Telephone Encounter (Signed)
Pt requesting refill of pain meds Pt was seen 08/10/2016 Please review and advise

## 2016-08-25 MED ORDER — OXYCODONE HCL 10 MG PO TABS: 10.0000 mg | ORAL_TABLET | Freq: Four times a day (QID) | ORAL | 0 refills | Status: DC | PRN

## 2016-08-25 MED ORDER — OXYCODONE HCL 10 MG PO TABS: 10.0000 mg | ORAL_TABLET | Freq: Four times a day (QID) | ORAL | 0 refills | Status: DC

## 2016-08-25 NOTE — Telephone Encounter (Signed)
Rx up front

## 2016-08-25 NOTE — Telephone Encounter (Signed)
Scripts printed for 2 months. Appointment at that time

## 2016-08-25 NOTE — Telephone Encounter (Signed)
Wife will call with names

## 2016-08-31 ENCOUNTER — Ambulatory Visit (INDEPENDENT_AMBULATORY_CARE_PROVIDER_SITE_OTHER): Payer: Medicare Other | Admitting: Gastroenterology

## 2016-08-31 ENCOUNTER — Encounter: Payer: Self-pay | Admitting: Gastroenterology

## 2016-08-31 VITALS — BP 102/66 | HR 84 | Temp 97.6°F | Ht 69.0 in | Wt 194.8 lb

## 2016-08-31 DIAGNOSIS — K7031 Alcoholic cirrhosis of liver with ascites: Secondary | ICD-10-CM
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
## 2016-08-31 NOTE — Patient Instructions (Signed)
1. Please have your labs done. We will contact you with results and further information about your fluid pills. 2. I will ask Dr. Jena Gaussourk if he plans to repeat upper endoscopy for banding of the blood vessels in your esophagus. Further recommendations to follow.

## 2016-09-01 LAB — COMPREHENSIVE METABOLIC PANEL
ALBUMIN: 2.6 g/dL — AB (ref 3.6–5.1)
ALK PHOS: 112 U/L (ref 40–115)
ALT: 17 U/L (ref 9–46)
AST: 25 U/L (ref 10–35)
BILIRUBIN TOTAL: 1.5 mg/dL — AB (ref 0.2–1.2)
BUN: 43 mg/dL — ABNORMAL HIGH (ref 7–25)
CALCIUM: 8.2 mg/dL — AB (ref 8.6–10.3)
CO2: 20 mmol/L (ref 20–31)
Chloride: 103 mmol/L (ref 98–110)
Creat: 2.85 mg/dL — ABNORMAL HIGH (ref 0.70–1.25)
Glucose, Bld: 140 mg/dL — ABNORMAL HIGH (ref 65–99)
POTASSIUM: 4.4 mmol/L (ref 3.5–5.3)
Sodium: 129 mmol/L — ABNORMAL LOW (ref 135–146)
TOTAL PROTEIN: 5.4 g/dL — AB (ref 6.1–8.1)

## 2016-09-01 LAB — PROTIME-INR
INR: 1.2 — AB
PROTHROMBIN TIME: 12.6 s — AB (ref 9.0–11.5)

## 2016-09-01 LAB — CBC WITH DIFFERENTIAL/PLATELET
BASOS ABS: 55 {cells}/uL (ref 0–200)
Basophils Relative: 1 %
EOS PCT: 3 %
Eosinophils Absolute: 165 cells/uL (ref 15–500)
HCT: 24.2 % — ABNORMAL LOW (ref 38.5–50.0)
HEMOGLOBIN: 8.3 g/dL — AB (ref 13.2–17.1)
LYMPHS ABS: 1210 {cells}/uL (ref 850–3900)
Lymphocytes Relative: 22 %
MCH: 31.4 pg (ref 27.0–33.0)
MCHC: 34.3 g/dL (ref 32.0–36.0)
MCV: 91.7 fL (ref 80.0–100.0)
MONOS PCT: 15 %
MPV: 8.5 fL (ref 7.5–12.5)
Monocytes Absolute: 825 cells/uL (ref 200–950)
NEUTROS ABS: 3245 {cells}/uL (ref 1500–7800)
Neutrophils Relative %: 59 %
PLATELETS: 86 10*3/uL — AB (ref 140–400)
RBC: 2.64 MIL/uL — AB (ref 4.20–5.80)
RDW: 15.6 % — ABNORMAL HIGH (ref 11.0–15.0)
WBC: 5.5 10*3/uL (ref 3.8–10.8)

## 2016-09-03 ENCOUNTER — Encounter (HOSPITAL_COMMUNITY): Payer: Self-pay

## 2016-09-03 ENCOUNTER — Ambulatory Visit (HOSPITAL_COMMUNITY)
Admission: RE | Admit: 2016-09-03 | Discharge: 2016-09-03 | Disposition: A | Discharge status: Home / Self Care | Payer: Medicare Other | Source: Ambulatory Visit | Attending: Gastroenterology | Admitting: Gastroenterology

## 2016-09-03 ENCOUNTER — Other Ambulatory Visit: Payer: Self-pay

## 2016-09-03 ENCOUNTER — Telehealth: Payer: Self-pay

## 2016-09-03 DIAGNOSIS — R188 Other ascites: Secondary | ICD-10-CM

## 2016-09-03 DIAGNOSIS — K7460 Unspecified cirrhosis of liver: Secondary | ICD-10-CM

## 2016-09-03 LAB — BODY FLUID CELL COUNT WITH DIFFERENTIAL
Eos, Fluid: 0 %
LYMPHS FL: 70 %
Monocyte-Macrophage-Serous Fluid: 25 % — ABNORMAL LOW (ref 50–90)
NEUTROPHIL FLUID: 1 % (ref 0–25)
Other: 4 %
Total Nucleated Cell Count, Fluid: 62 cu mm (ref 0–1000)

## 2016-09-03 MED ORDER — ALBUMIN HUMAN 25 % IV SOLN
INTRAVENOUS | Status: AC
  Administered 2016-09-03: 50 g via INTRAVENOUS
  Filled 2016-09-03: qty 200

## 2016-09-03 MED ORDER — ALBUMIN HUMAN 25 % IV SOLN
50.0000 g | Freq: Once | INTRAVENOUS | Status: AC
  Administered 2016-09-03: 50 g via INTRAVENOUS

## 2016-09-03 NOTE — Progress Notes (Signed)
Paracentesis complete no signs of distress.  

## 2016-09-03 NOTE — Telephone Encounter (Signed)
Wife called office and requested pt have paracentesis done today. US para scheduled for 11:00am today, arrive at 10:45am. Called and informed pt.

## 2016-09-04 LAB — PATHOLOGIST SMEAR REVIEW

## 2016-09-06 NOTE — Progress Notes (Signed)
Labs are stable but his MELD NA is 26.   Can we add-on IgG/IgA/IgM, AMA, ANA, antismooth muscle Ab.   His Cre is better than three weeks ago, send copy to Dr. Fausto SkillernBefakadu.

## 2016-09-06 NOTE — Assessment & Plan Note (Signed)
65 y/o male with etoh related cirrhosis s/p recent EGD with esophageal variceal ligation who presents for follow up. We need updated labs for MELD NA testing. He is up to date on liver imaging. He continues with frequent paracentesis with 10 L removed with the last two. Usually having LVAP twice per month. He has not had hep a and b vaccinations to his knowledge. We will check on that. likel needs additional EGD with EV banding, will verify with Dr. Jena Gaussourk.   Granddaughter states the patient reported to her that PCP advised him to stop all diuretics. He is followed by Dr. Fausto SkillernBefakadu. Labs to be done today.

## 2016-09-07 NOTE — Progress Notes (Signed)
CC'D TO PCP °

## 2016-09-09 LAB — CULTURE, BODY FLUID W GRAM STAIN -BOTTLE: Culture: NO GROWTH

## 2016-09-09 LAB — CULTURE, BODY FLUID-BOTTLE

## 2016-09-12 ENCOUNTER — Other Ambulatory Visit: Payer: Self-pay | Admitting: Physician Assistant

## 2016-09-12 DIAGNOSIS — M5136 Other intervertebral disc degeneration, lumbar region: Secondary | ICD-10-CM

## 2016-09-16 NOTE — Progress Notes (Signed)
Yes, we need to have patient complete these labs. Thanks!

## 2016-09-17 ENCOUNTER — Other Ambulatory Visit: Payer: Self-pay

## 2016-09-17 ENCOUNTER — Other Ambulatory Visit: Payer: Self-pay | Admitting: Physician Assistant

## 2016-09-17 ENCOUNTER — Other Ambulatory Visit: Payer: Self-pay | Admitting: Gastroenterology

## 2016-09-17 DIAGNOSIS — K7031 Alcoholic cirrhosis of liver with ascites: Secondary | ICD-10-CM

## 2016-09-23 ENCOUNTER — Ambulatory Visit (HOSPITAL_COMMUNITY)
Admission: RE | Admit: 2016-09-23 | Discharge: 2016-09-23 | Disposition: A | Discharge status: Home / Self Care | Payer: Medicare Other | Source: Ambulatory Visit | Attending: Gastroenterology | Admitting: Gastroenterology

## 2016-09-23 ENCOUNTER — Other Ambulatory Visit: Payer: Self-pay

## 2016-09-23 ENCOUNTER — Encounter (HOSPITAL_COMMUNITY): Payer: Self-pay

## 2016-09-23 DIAGNOSIS — R188 Other ascites: Secondary | ICD-10-CM

## 2016-09-23 LAB — GRAM STAIN

## 2016-09-23 LAB — BODY FLUID CELL COUNT WITH DIFFERENTIAL
Eos, Fluid: 0 %
LYMPHS FL: 76 %
MONOCYTE-MACROPHAGE-SEROUS FLUID: 22 % — AB (ref 50–90)
Neutrophil Count, Fluid: 2 % (ref 0–25)
Total Nucleated Cell Count, Fluid: 66 cu mm (ref 0–1000)

## 2016-09-23 NOTE — Progress Notes (Signed)
Paracentesis complete no signs of distress. 6.4L yellow colored ascites removed.  

## 2016-09-24 LAB — PATHOLOGIST SMEAR REVIEW

## 2016-09-29 LAB — CULTURE, BODY FLUID W GRAM STAIN -BOTTLE: Culture: NO GROWTH

## 2016-09-29 LAB — CULTURE, BODY FLUID-BOTTLE

## 2016-09-30 ENCOUNTER — Other Ambulatory Visit: Payer: Self-pay

## 2016-09-30 ENCOUNTER — Telehealth: Payer: Self-pay | Admitting: Internal Medicine

## 2016-09-30 DIAGNOSIS — R188 Other ascites: Secondary | ICD-10-CM

## 2016-09-30 NOTE — Telephone Encounter (Signed)
Family is aware

## 2016-09-30 NOTE — Telephone Encounter (Signed)
Pt is set up for PARA on 10/01/16 @ 1:00 pm. I tried to call with no answer

## 2016-09-30 NOTE — Telephone Encounter (Signed)
Grandson called to see if we could schedule a PARA for either this afternoon or tomorrow morning. Please advise and call [phone]

## 2016-10-01 ENCOUNTER — Ambulatory Visit (HOSPITAL_COMMUNITY)
Admission: RE | Admit: 2016-10-01 | Discharge: 2016-10-01 | Disposition: A | Discharge status: Home / Self Care | Payer: Medicare Other | Source: Ambulatory Visit | Attending: Gastroenterology | Admitting: Gastroenterology

## 2016-10-01 DIAGNOSIS — R188 Other ascites: Secondary | ICD-10-CM | POA: Diagnosis not present

## 2016-10-01 LAB — BODY FLUID CELL COUNT WITH DIFFERENTIAL
Eos, Fluid: 0 %
Lymphs, Fluid: 43 %
Monocyte-Macrophage-Serous Fluid: 55 % (ref 50–90)
Neutrophil Count, Fluid: 2 % (ref 0–25)
Other Cells, Fluid: 0 %
WBC FLUID: 51 uL (ref 0–1000)

## 2016-10-01 LAB — GRAM STAIN: GRAM STAIN: NONE SEEN

## 2016-10-01 NOTE — Progress Notes (Signed)
Paracentesis complete no signs of distress. 10.8L yellow colored ascites removed.  

## 2016-10-01 NOTE — Procedures (Signed)
PreOperative Dx: Cirrhosis, ascites Postoperative Dx: Cirrhosis, ascites Procedure:   US guided paracentesis Radiologist:  Tyron RussellBoles Anesthesia:  10 ml of1% lidocaine Specimen:  10.8 L of yellow ascitic fluid EBL:   < 1 ml Complications: None

## 2016-10-02 LAB — PATHOLOGIST SMEAR REVIEW

## 2016-10-06 LAB — CULTURE, BODY FLUID W GRAM STAIN -BOTTLE: Culture: NO GROWTH

## 2016-10-06 LAB — CULTURE, BODY FLUID-BOTTLE

## 2016-10-14 ENCOUNTER — Other Ambulatory Visit: Payer: Self-pay | Admitting: Physician Assistant

## 2016-10-14 NOTE — Progress Notes (Signed)
Can we make arrangements for patient to have these add-on labs done with his next paracentesis. Also need to update other labs, PT/INR, CMET.

## 2016-10-15 ENCOUNTER — Other Ambulatory Visit: Payer: Self-pay | Admitting: Physician Assistant

## 2016-10-19 ENCOUNTER — Other Ambulatory Visit: Payer: Self-pay

## 2016-10-19 DIAGNOSIS — R188 Other ascites: Secondary | ICD-10-CM

## 2016-10-20 ENCOUNTER — Ambulatory Visit (HOSPITAL_COMMUNITY)
Admission: RE | Admit: 2016-10-20 | Discharge: 2016-10-20 | Disposition: A | Discharge status: Home / Self Care | Payer: Medicare Other | Source: Ambulatory Visit | Attending: Gastroenterology | Admitting: Gastroenterology

## 2016-10-20 ENCOUNTER — Encounter (HOSPITAL_COMMUNITY): Payer: Self-pay

## 2016-10-20 DIAGNOSIS — R188 Other ascites: Secondary | ICD-10-CM

## 2016-10-20 DIAGNOSIS — K7031 Alcoholic cirrhosis of liver with ascites: Secondary | ICD-10-CM | POA: Diagnosis not present

## 2016-10-20 LAB — BODY FLUID CELL COUNT WITH DIFFERENTIAL
EOS FL: 0 %
Lymphs, Fluid: 84 %
Monocyte-Macrophage-Serous Fluid: 12 % — ABNORMAL LOW (ref 50–90)
Neutrophil Count, Fluid: 4 % (ref 0–25)
Other Cells, Fluid: 0 %
WBC FLUID: 59 uL (ref 0–1000)

## 2016-10-20 LAB — COMPREHENSIVE METABOLIC PANEL
ALBUMIN: 2.4 g/dL — AB (ref 3.6–5.1)
ALT: 21 U/L (ref 9–46)
AST: 31 U/L (ref 10–35)
Alkaline Phosphatase: 97 U/L (ref 40–115)
BUN: 48 mg/dL — ABNORMAL HIGH (ref 7–25)
CALCIUM: 8.2 mg/dL — AB (ref 8.6–10.3)
CO2: 15 mmol/L — AB (ref 20–31)
Chloride: 107 mmol/L (ref 98–110)
Creat: 3.14 mg/dL — ABNORMAL HIGH (ref 0.70–1.25)
GLUCOSE: 161 mg/dL — AB (ref 65–99)
POTASSIUM: 4.9 mmol/L (ref 3.5–5.3)
Sodium: 130 mmol/L — ABNORMAL LOW (ref 135–146)
Total Bilirubin: 1.4 mg/dL — ABNORMAL HIGH (ref 0.2–1.2)
Total Protein: 5.7 g/dL — ABNORMAL LOW (ref 6.1–8.1)

## 2016-10-20 LAB — CBC WITH DIFFERENTIAL/PLATELET
Basophils Absolute: 70 cells/uL (ref 0–200)
Basophils Relative: 1 %
Eosinophils Absolute: 280 cells/uL (ref 15–500)
Eosinophils Relative: 4 %
HCT: 27.2 % — ABNORMAL LOW (ref 38.5–50.0)
Hemoglobin: 9.1 g/dL — ABNORMAL LOW (ref 13.2–17.1)
LYMPHS PCT: 24 %
Lymphs Abs: 1680 cells/uL (ref 850–3900)
MCH: 31.2 pg (ref 27.0–33.0)
MCHC: 33.5 g/dL (ref 32.0–36.0)
MCV: 93.2 fL (ref 80.0–100.0)
MONO ABS: 770 {cells}/uL (ref 200–950)
MONOS PCT: 11 %
MPV: 8.7 fL (ref 7.5–12.5)
NEUTROS ABS: 4200 {cells}/uL (ref 1500–7800)
NEUTROS PCT: 60 %
Platelets: 99 10*3/uL — ABNORMAL LOW (ref 140–400)
RBC: 2.92 MIL/uL — AB (ref 4.20–5.80)
RDW: 16.9 % — AB (ref 11.0–15.0)
WBC: 7 10*3/uL (ref 3.8–10.8)

## 2016-10-20 LAB — GRAM STAIN: GRAM STAIN: NONE SEEN

## 2016-10-20 NOTE — Progress Notes (Signed)
Paracentesis complete no signs of distress. 12.4L yellow colored ascites removed. Pt declines Albumin.

## 2016-10-20 NOTE — Procedures (Signed)
PreOperative Dx: Alcoholic cirrhosis, ascites Postoperative Dx: Alcoholic cirrhosis, ascites Procedure:   US guided paracentesis Radiologist:  Tyron RussellBoles Anesthesia:  10 ml of1% lidocaine Specimen:  12.4 L of yellow ascitic fluid EBL:   < 1 ml Complications: None

## 2016-10-21 LAB — IGG, IGA, IGM
IGM, SERUM: 216 mg/dL (ref 48–271)
IgA: 782 mg/dL — ABNORMAL HIGH (ref 81–463)
IgG (Immunoglobin G), Serum: 1581 mg/dL (ref 694–1618)

## 2016-10-21 LAB — PROTIME-INR
INR: 1.2 — AB
Prothrombin Time: 13.1 s — ABNORMAL HIGH (ref 9.0–11.5)

## 2016-10-21 LAB — ANTI-SMOOTH MUSCLE ANTIBODY, IGG

## 2016-10-22 ENCOUNTER — Other Ambulatory Visit: Payer: Self-pay | Admitting: Physician Assistant

## 2016-10-22 DIAGNOSIS — M5136 Other intervertebral disc degeneration, lumbar region: Secondary | ICD-10-CM

## 2016-10-22 LAB — ANA: ANA: NEGATIVE

## 2016-10-22 LAB — PATHOLOGIST SMEAR REVIEW

## 2016-10-23 ENCOUNTER — Other Ambulatory Visit: Payer: Self-pay | Admitting: Physician Assistant

## 2016-10-23 DIAGNOSIS — M5136 Other intervertebral disc degeneration, lumbar region: Secondary | ICD-10-CM

## 2016-10-23 MED ORDER — OXYCODONE HCL 10 MG PO TABS: 10.0000 mg | ORAL_TABLET | Freq: Four times a day (QID) | ORAL | 0 refills | Status: DC | PRN

## 2016-10-23 NOTE — Telephone Encounter (Signed)
Patient aware.

## 2016-10-23 NOTE — Telephone Encounter (Signed)
One printed, please make appointment this month.

## 2016-10-25 LAB — CULTURE, BODY FLUID W GRAM STAIN -BOTTLE

## 2016-10-25 LAB — CULTURE, BODY FLUID-BOTTLE: CULTURE: NO GROWTH

## 2016-11-02 NOTE — Progress Notes (Signed)
See other phone note

## 2016-11-02 NOTE — Progress Notes (Signed)
MELD 25. With creatinine rising. ANA, ASMA negative. AMA did not get done but with normal Alkphos we will hold on that one.   Please find out for sure what fluid pills he is taking right now. When does he see Dr. Fausto SkillernBefakadu again, it appears he renal function is declining.

## 2016-11-03 ENCOUNTER — Telehealth: Payer: Self-pay | Admitting: Internal Medicine

## 2016-11-03 ENCOUNTER — Other Ambulatory Visit: Payer: Self-pay

## 2016-11-03 DIAGNOSIS — R188 Other ascites: Secondary | ICD-10-CM

## 2016-11-03 NOTE — Telephone Encounter (Signed)
US Paracentesis scheduled for tomorrow at 1:15pm, [facility]. Called and informed pt.

## 2016-11-03 NOTE — Telephone Encounter (Signed)
Pt needs to have fluid drawn. Please call [phone]

## 2016-11-04 ENCOUNTER — Encounter (HOSPITAL_COMMUNITY): Payer: Self-pay

## 2016-11-04 ENCOUNTER — Ambulatory Visit (HOSPITAL_COMMUNITY)
Admission: RE | Admit: 2016-11-04 | Discharge: 2016-11-04 | Disposition: A | Discharge status: Home / Self Care | Payer: Medicare Other | Source: Ambulatory Visit | Attending: Gastroenterology | Admitting: Gastroenterology

## 2016-11-04 DIAGNOSIS — R188 Other ascites: Secondary | ICD-10-CM | POA: Insufficient documentation

## 2016-11-04 LAB — BODY FLUID CELL COUNT WITH DIFFERENTIAL
Eos, Fluid: 0 %
Lymphs, Fluid: 72 %
Monocyte-Macrophage-Serous Fluid: 22 % — ABNORMAL LOW (ref 50–90)
Neutrophil Count, Fluid: 6 % (ref 0–25)
Other Cells, Fluid: 0 %
WBC FLUID: 58 uL (ref 0–1000)

## 2016-11-04 LAB — GRAM STAIN: GRAM STAIN: NONE SEEN

## 2016-11-04 NOTE — Progress Notes (Signed)
10.2L of yellow colored ascites removed. Pt in NAD. Tolerated well.

## 2016-11-04 NOTE — Procedures (Signed)
PreOperative Dx: Alcoholic cirrhosis, ascites Postoperative Dx: Alcoholic cirrhosis, ascites Procedure:   US guided paracentesis Radiologist:  Tyron RussellBoles Anesthesia:  10 ml of1% lidocaine Specimen:  10.2 L of light yellow ascitic fluid EBL:   < 1 ml Complications: None

## 2016-11-04 NOTE — Progress Notes (Signed)
Pt refused Albumin 

## 2016-11-05 ENCOUNTER — Telehealth: Payer: Self-pay | Admitting: Internal Medicine

## 2016-11-05 NOTE — Telephone Encounter (Signed)
Recall for ultrasound 

## 2016-11-05 NOTE — Telephone Encounter (Signed)
Letter mailed

## 2016-11-06 LAB — PATHOLOGIST SMEAR REVIEW

## 2016-11-10 LAB — CULTURE, BODY FLUID W GRAM STAIN -BOTTLE

## 2016-11-10 LAB — CULTURE, BODY FLUID-BOTTLE: CULTURE: NO GROWTH

## 2016-11-10 NOTE — Progress Notes (Signed)
Also needs to come back for ov next month with RMR only. (needs EGD+/-TCS, worsening creatinine, cirrhosis)

## 2016-11-11 ENCOUNTER — Ambulatory Visit: Payer: Medicare Other | Admitting: Physician Assistant

## 2016-11-12 ENCOUNTER — Encounter: Payer: Self-pay | Admitting: Physician Assistant

## 2016-11-12 ENCOUNTER — Encounter: Payer: Self-pay | Admitting: Internal Medicine

## 2016-11-12 ENCOUNTER — Telehealth: Payer: Self-pay | Admitting: Physician Assistant

## 2016-11-12 NOTE — Progress Notes (Signed)
APPT MADE AND LETTER SENT  °

## 2016-11-12 NOTE — Progress Notes (Signed)
noted 

## 2016-11-13 ENCOUNTER — Other Ambulatory Visit: Payer: Self-pay | Admitting: Physician Assistant

## 2016-11-13 NOTE — Telephone Encounter (Signed)
Rx called in 

## 2016-11-16 NOTE — Telephone Encounter (Signed)
Detailed message left for patient to call back and reschedule appointment if still needs.

## 2016-11-23 ENCOUNTER — Other Ambulatory Visit: Payer: Self-pay | Admitting: Physician Assistant

## 2016-11-24 ENCOUNTER — Ambulatory Visit (INDEPENDENT_AMBULATORY_CARE_PROVIDER_SITE_OTHER): Payer: Medicare Other | Admitting: Physician Assistant

## 2016-11-24 ENCOUNTER — Ambulatory Visit: Payer: Medicare Other | Admitting: Physician Assistant

## 2016-11-24 ENCOUNTER — Encounter: Payer: Self-pay | Admitting: Physician Assistant

## 2016-11-24 VITALS — BP 118/72 | HR 69 | Temp 97.9°F | Ht 69.0 in | Wt 192.0 lb

## 2016-11-24 DIAGNOSIS — J9601 Acute respiratory failure with hypoxia: Secondary | ICD-10-CM | POA: Diagnosis not present

## 2016-11-24 DIAGNOSIS — K7031 Alcoholic cirrhosis of liver with ascites: Secondary | ICD-10-CM | POA: Diagnosis not present

## 2016-11-24 DIAGNOSIS — K7682 Hepatic encephalopathy: Secondary | ICD-10-CM

## 2016-11-24 DIAGNOSIS — M5136 Other intervertebral disc degeneration, lumbar region: Secondary | ICD-10-CM | POA: Diagnosis not present

## 2016-11-24 DIAGNOSIS — K7290 Hepatic failure, unspecified without coma: Secondary | ICD-10-CM | POA: Diagnosis not present

## 2016-11-24 DIAGNOSIS — F419 Anxiety disorder, unspecified: Secondary | ICD-10-CM | POA: Diagnosis not present

## 2016-11-24 DIAGNOSIS — M51369 Other intervertebral disc degeneration, lumbar region without mention of lumbar back pain or lower extremity pain: Secondary | ICD-10-CM | POA: Insufficient documentation

## 2016-11-24 MED ORDER — OXYCODONE HCL 10 MG PO TABS: 10.0000 mg | ORAL_TABLET | Freq: Four times a day (QID) | ORAL | 0 refills | Status: DC

## 2016-11-24 MED ORDER — ALPRAZOLAM 1 MG PO TABS: 0.5000 mg | ORAL_TABLET | Freq: Two times a day (BID) | ORAL | 5 refills | Status: DC | PRN

## 2016-11-24 MED ORDER — ALBUTEROL SULFATE HFA 108 (90 BASE) MCG/ACT IN AERS: 2.0000 | INHALATION_SPRAY | Freq: Four times a day (QID) | RESPIRATORY_TRACT | 6 refills | Status: DC | PRN

## 2016-11-24 MED ORDER — OXYCODONE HCL 10 MG PO TABS: 10.0000 mg | ORAL_TABLET | Freq: Four times a day (QID) | ORAL | 0 refills | Status: DC | PRN

## 2016-11-24 NOTE — Patient Instructions (Signed)
Ascites  Ascites is a collection of excess fluid in the abdomen. Ascites can range from mild to severe. It can get worse without treatment.  What are the causes?  Possible causes include:  · Cirrhosis. This is the most common cause of ascites.  · Infection or inflammation in the abdomen.  · Cancer in the abdomen.  · Heart failure.  · Kidney disease.  · Inflammation of the pancreas.  · Clots in the veins of the liver.    What are the signs or symptoms?  Signs and symptoms may include:  · A feeling of fullness in your abdomen. This is common.  · An increase in the size of your abdomen or your waist.  · Swelling in your legs.  · Swelling of the scrotum in men.  · Difficulty breathing.  · Abdominal pain.  · Sudden weight gain.    If the condition is mild, you may not have symptoms.  How is this diagnosed?  To make a diagnosis, your health care provider will:  · Ask about your medical history.  · Perform a physical exam.  · Order imaging tests, such as an ultrasound or CT scan of your abdomen.    How is this treated?  Treatment depends on the cause of the ascites. It may include:  · Taking a pill to make you urinate. This is called a water pill (diuretic pill).  · Strictly reducing your salt (sodium) intake. Salt can cause extra fluid to be kept in the body, and this makes ascites worse.  · Having a procedure to remove fluid from your abdomen (paracentesis).  · Having a procedure to transfer fluid from your abdomen into a vein.  · Having a procedure that connects two of the major veins within your liver and relieves pressure on your liver (TIPS procedure).    Ascites may go away or improve with treatment of the condition that caused it.  Follow these instructions at home:  · Keep track of your weight. To do this, weigh yourself at the same time every day and record your weight.  · Keep track of how much you drink and any changes in the amount you urinate.  · Follow any instructions that your health care provider gives  you about how much to drink.  · Try not to eat salty (high-sodium) foods.  · Take medicines only as directed by your health care provider.  · Keep all follow-up visits as directed by your health care provider. This is important.  · Report any changes in your health to your health care provider, especially if you develop new symptoms or your symptoms get worse.  Contact a health care provider if:  · Your gain more than 3 pounds in 3 days.  · Your abdominal size or your waist size increases.  · You have new swelling in your legs.  · The swelling in your legs gets worse.  Get help right away if:  · You develop a fever.  · You develop confusion.  · You develop new or worsening difficulty breathing.  · You develop new or worsening abdominal pain.  · You develop new or worsening swelling in the scrotum (in men).  This information is not intended to replace advice given to you by your health care provider. Make sure you discuss any questions you have with your health care provider.  Document Released: 09/14/2005 Document Revised: 01/22/2016 Document Reviewed: 04/13/2014  [employer]vier Interactive Patient Education © 2017 [employer]
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
## 2016-11-25 ENCOUNTER — Telehealth: Payer: Self-pay | Admitting: Internal Medicine

## 2016-11-25 ENCOUNTER — Other Ambulatory Visit: Payer: Self-pay

## 2016-11-25 DIAGNOSIS — R188 Other ascites: Secondary | ICD-10-CM

## 2016-11-25 NOTE — Telephone Encounter (Signed)
Orders are in

## 2016-11-25 NOTE — Telephone Encounter (Signed)
Dee from radiology called to say that patient showed up today wanting a para. Patient did not call us to let us be aware. Geraldine ContrasDee is needing an order. Pt is aware he can come back tomorrow at 2pm.

## 2016-11-26 ENCOUNTER — Encounter (HOSPITAL_COMMUNITY): Payer: Self-pay

## 2016-11-26 ENCOUNTER — Ambulatory Visit (HOSPITAL_COMMUNITY)
Admission: RE | Admit: 2016-11-26 | Discharge: 2016-11-26 | Disposition: A | Discharge status: Home / Self Care | Payer: Medicare Other | Source: Ambulatory Visit | Attending: Gastroenterology | Admitting: Gastroenterology

## 2016-11-26 ENCOUNTER — Ambulatory Visit (HOSPITAL_COMMUNITY): Admission: RE | Admit: 2016-11-26 | Payer: Medicare Other | Source: Ambulatory Visit

## 2016-11-26 DIAGNOSIS — K7460 Unspecified cirrhosis of liver: Secondary | ICD-10-CM | POA: Insufficient documentation

## 2016-11-26 DIAGNOSIS — R188 Other ascites: Secondary | ICD-10-CM | POA: Diagnosis not present

## 2016-11-26 LAB — BODY FLUID CELL COUNT WITH DIFFERENTIAL
EOS FL: 0 %
Lymphs, Fluid: 66 %
Monocyte-Macrophage-Serous Fluid: 33 % — ABNORMAL LOW (ref 50–90)
NEUTROPHIL FLUID: 1 % (ref 0–25)
Other Cells, Fluid: 0 %
WBC FLUID: 31 uL (ref 0–1000)

## 2016-11-26 LAB — GRAM STAIN

## 2016-11-26 NOTE — Procedures (Signed)
PreOperative Dx: Cirrhosis, ascites Postoperative Dx: Cirrhosis, ascites Procedure:   US guided paracentesis Radiologist:  Brendon Christoffel Anesthesia:  10 ml of1% lidocaine Specimen:  8.5 L of yellow ascitic fluid EBL:   < 1 ml Complications: None  

## 2016-11-26 NOTE — Progress Notes (Signed)
Paracentesis complete no signs of distress. 8.5 L yellow colored ascites removed.  

## 2016-11-27 LAB — PATHOLOGIST SMEAR REVIEW

## 2016-11-28 IMAGING — CT CT HEAD W/O CM
4 series · 16 of 47 positions shown, 18 images · non-contrast
Comparison: CT of the head performed 07/26/2011

CLINICAL DATA: Acute onset of altered mental status. Initial
encounter.

EXAM:
CT HEAD WITHOUT CONTRAST
TECHNIQUE: Contiguous axial images were obtained from the base of the skull
through the vertex without intravenous contrast.

[Series 2: head w/o · axial · non-contrast · 0.44mm/px · z∈[+34,+146]mm · 8 of 38 slices shown, 10 images]
[im 5/38  brain]
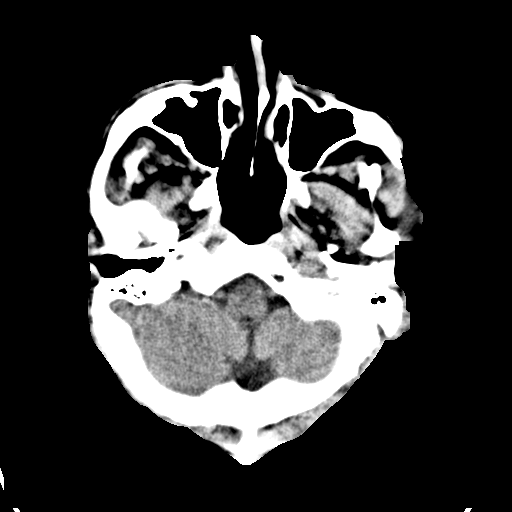
[im 5/38  bone]
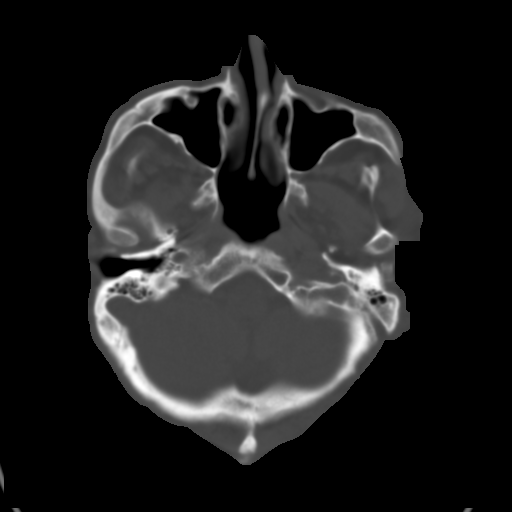
[im 9/38  brain]
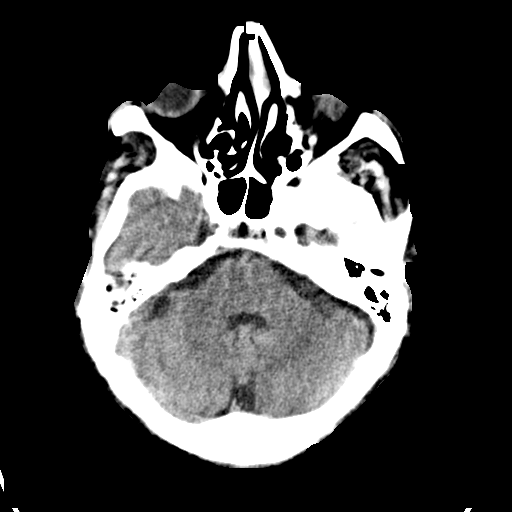
[im 13/38  brain]
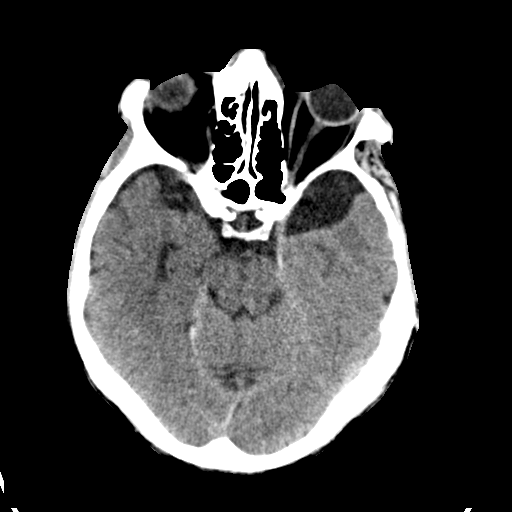
[im 17/38  brain]
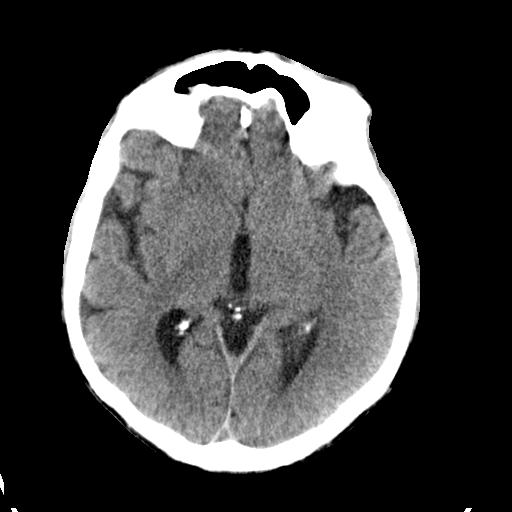
[im 21/38  brain]
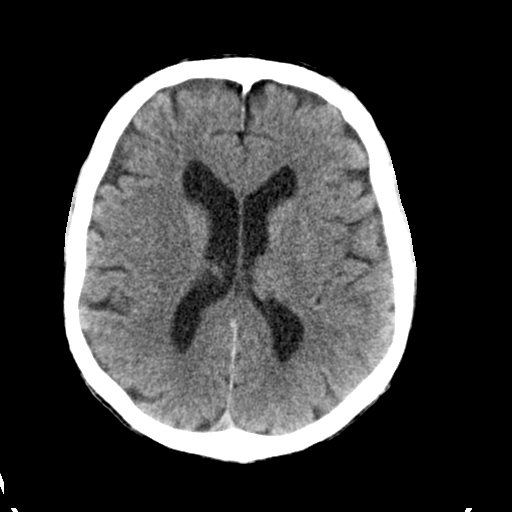
[im 21/38  bone]
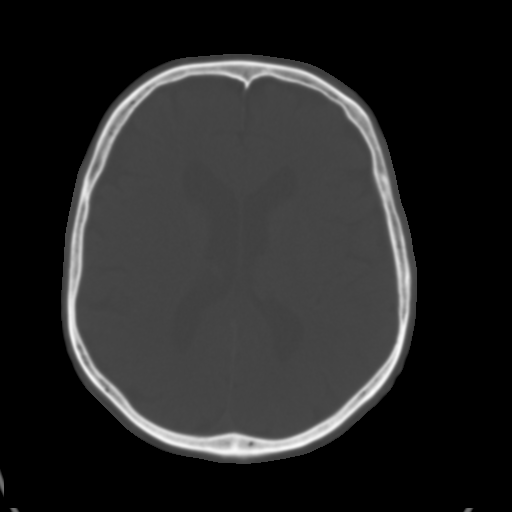
[im 25/38  brain]
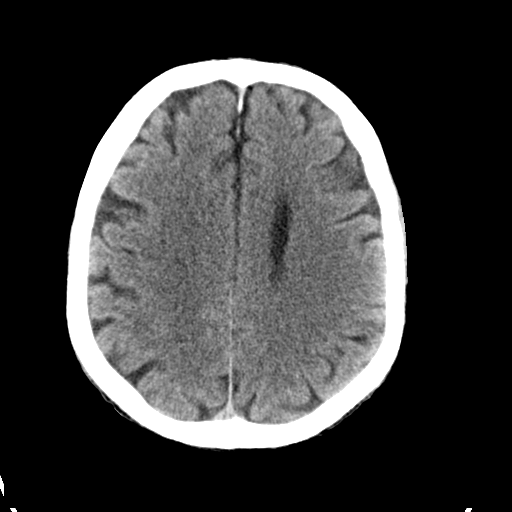
[im 29/38  brain]
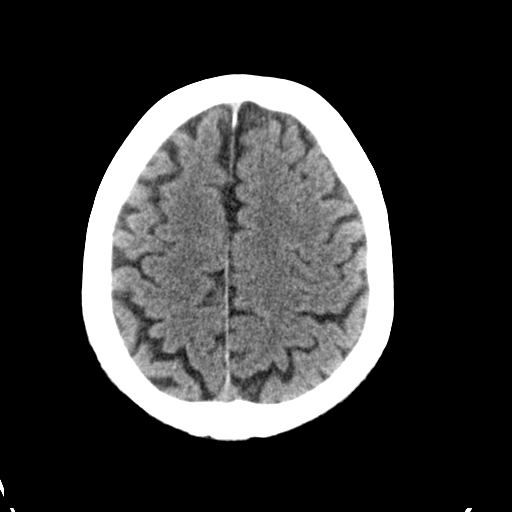
[im 33/38  brain]
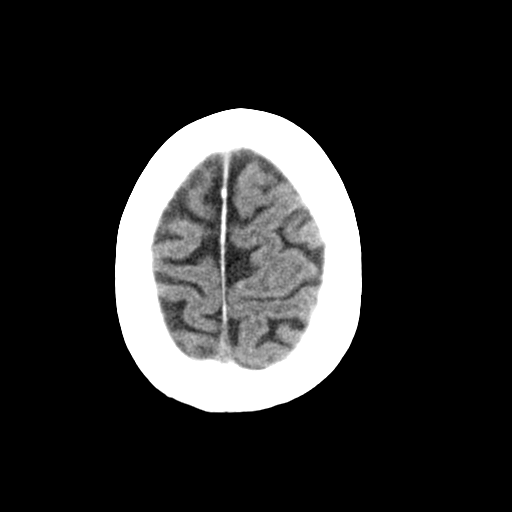

[Series 3: head bone · axial · 0.44mm/px · z∈[+32,+48]mm · 2 of 76 slices shown]
[im 8/76  bone]
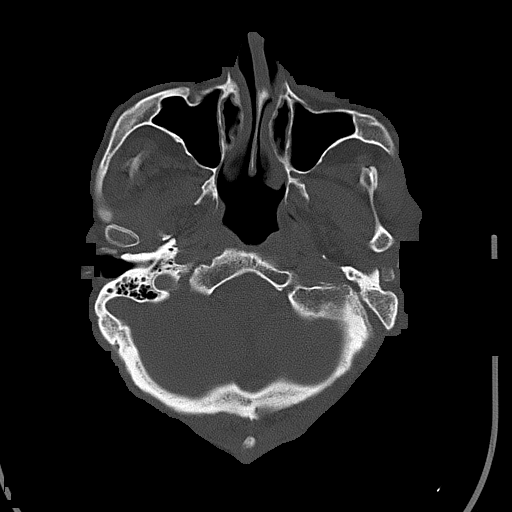
[im 16/76  bone]
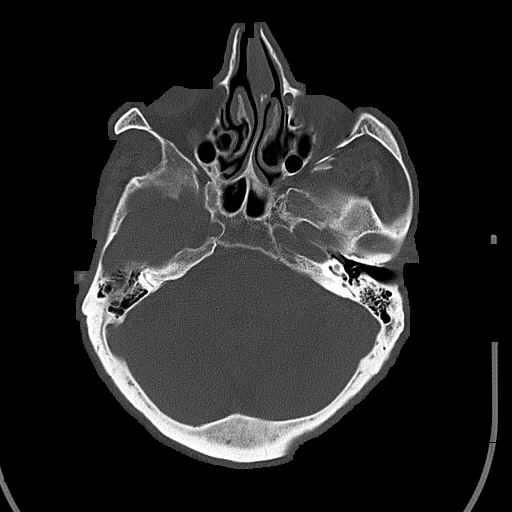

[Series 4: coronal · coronal · 0.30mm/px · 3 of 72 slices shown]
[im 24/72  brain]
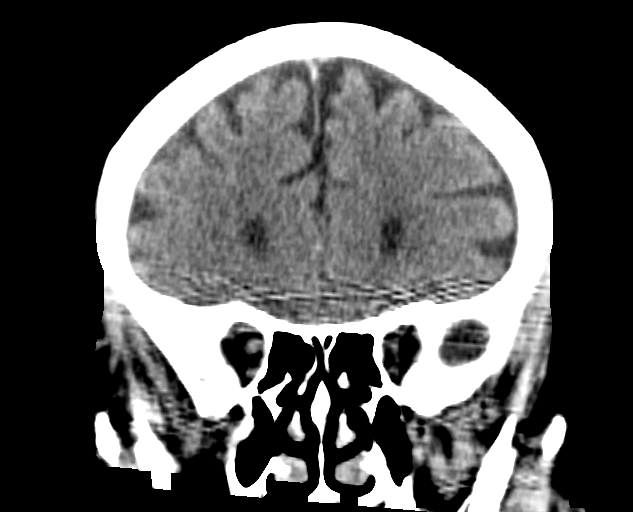
[im 32/72  brain]
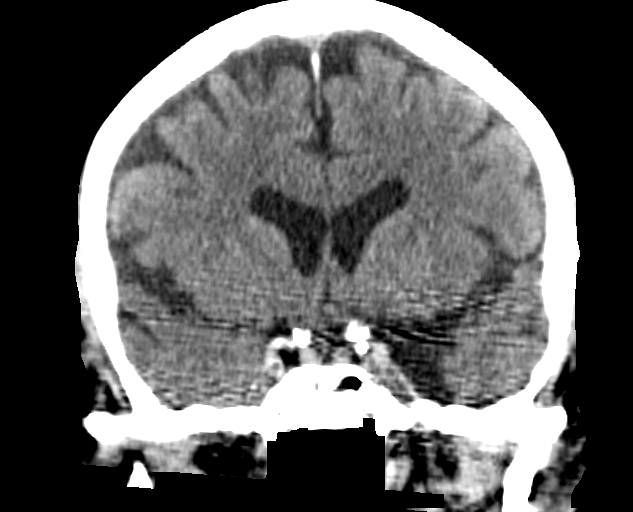
[im 40/72  brain]
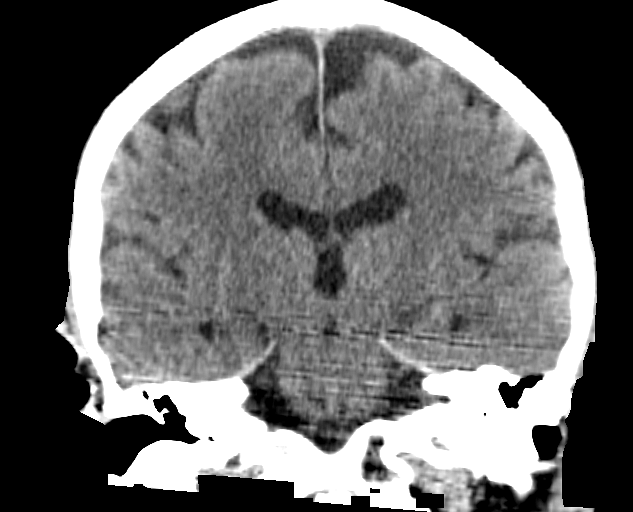

[Series 5: sagittal · sagittal · 0.33mm/px · 3 of 55 slices shown]
[im 19/55  brain]
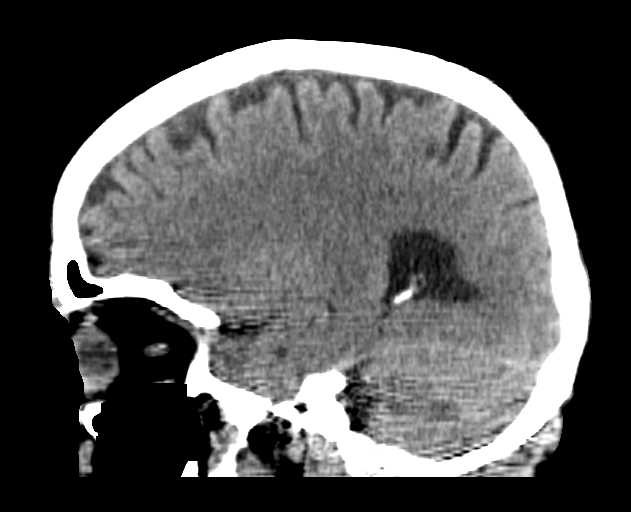
[im 28/55  brain]
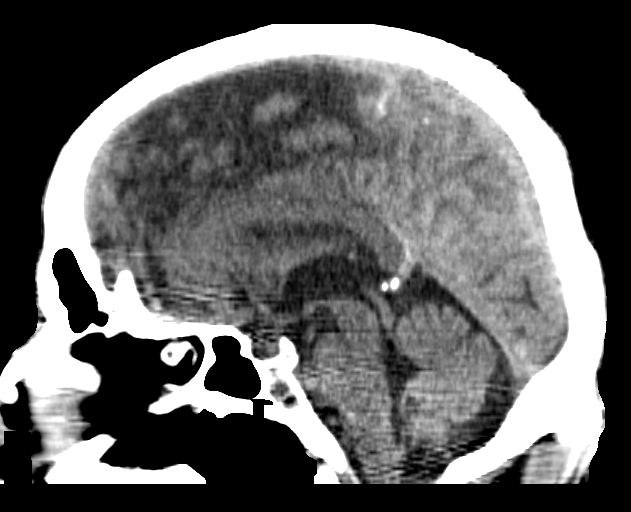
[im 37/55  brain]
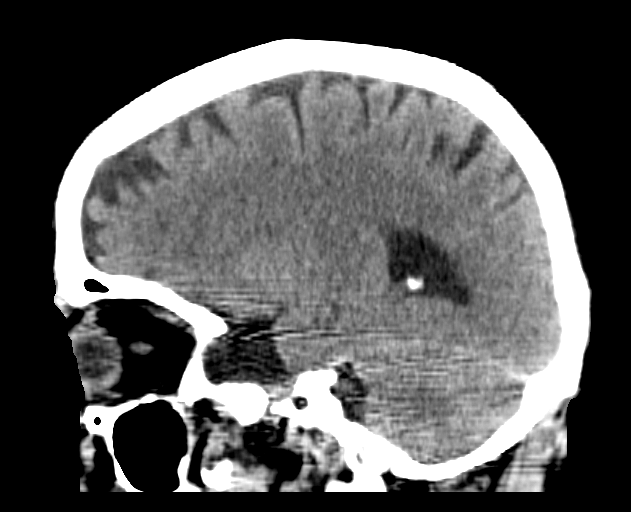

[16 of 47 positions shown; findings below may reference images not displayed]

FINDINGS: There is no evidence of acute infarction, mass lesion, or intra- or
extra-axial hemorrhage on CT.

Prominence of ventricles and sulci reflects mild cortical volume
loss. Mild cerebellar atrophy is noted. Mild periventricular and
subcortical white matter change likely reflects small vessel
ischemic microangiopathy. There may be an arachnoid cyst anterior to
the left temporal lobe.

The brainstem and fourth ventricle are within normal limits. The
basal ganglia are unremarkable in appearance. The cerebral
hemispheres demonstrate grossly normal gray-white differentiation.
No mass effect or midline shift is seen.

There is no evidence of fracture; visualized osseous structures are
unremarkable in appearance. The orbits are within normal limits. The
paranasal sinuses and mastoid air cells are well-aerated. No
significant soft tissue abnormalities are seen.
IMPRESSION: 1. No acute intracranial pathology seen on CT.
2. Mild cortical volume loss and scattered small vessel ischemic
microangiopathy.
3. Apparent arachnoid cyst anterior to the left temporal lobe.

## 2016-11-28 IMAGING — CR DG CHEST 1V PORT
1 series · 1 of 1 positions shown · non-contrast
Comparison: Prior radiograph from 03/31/2016.

CLINICAL DATA: Initial evaluation for acute altered mental status,
lethargic. History of hypertension, COPD.

EXAM:
PORTABLE CHEST 1 VIEW

[ap]
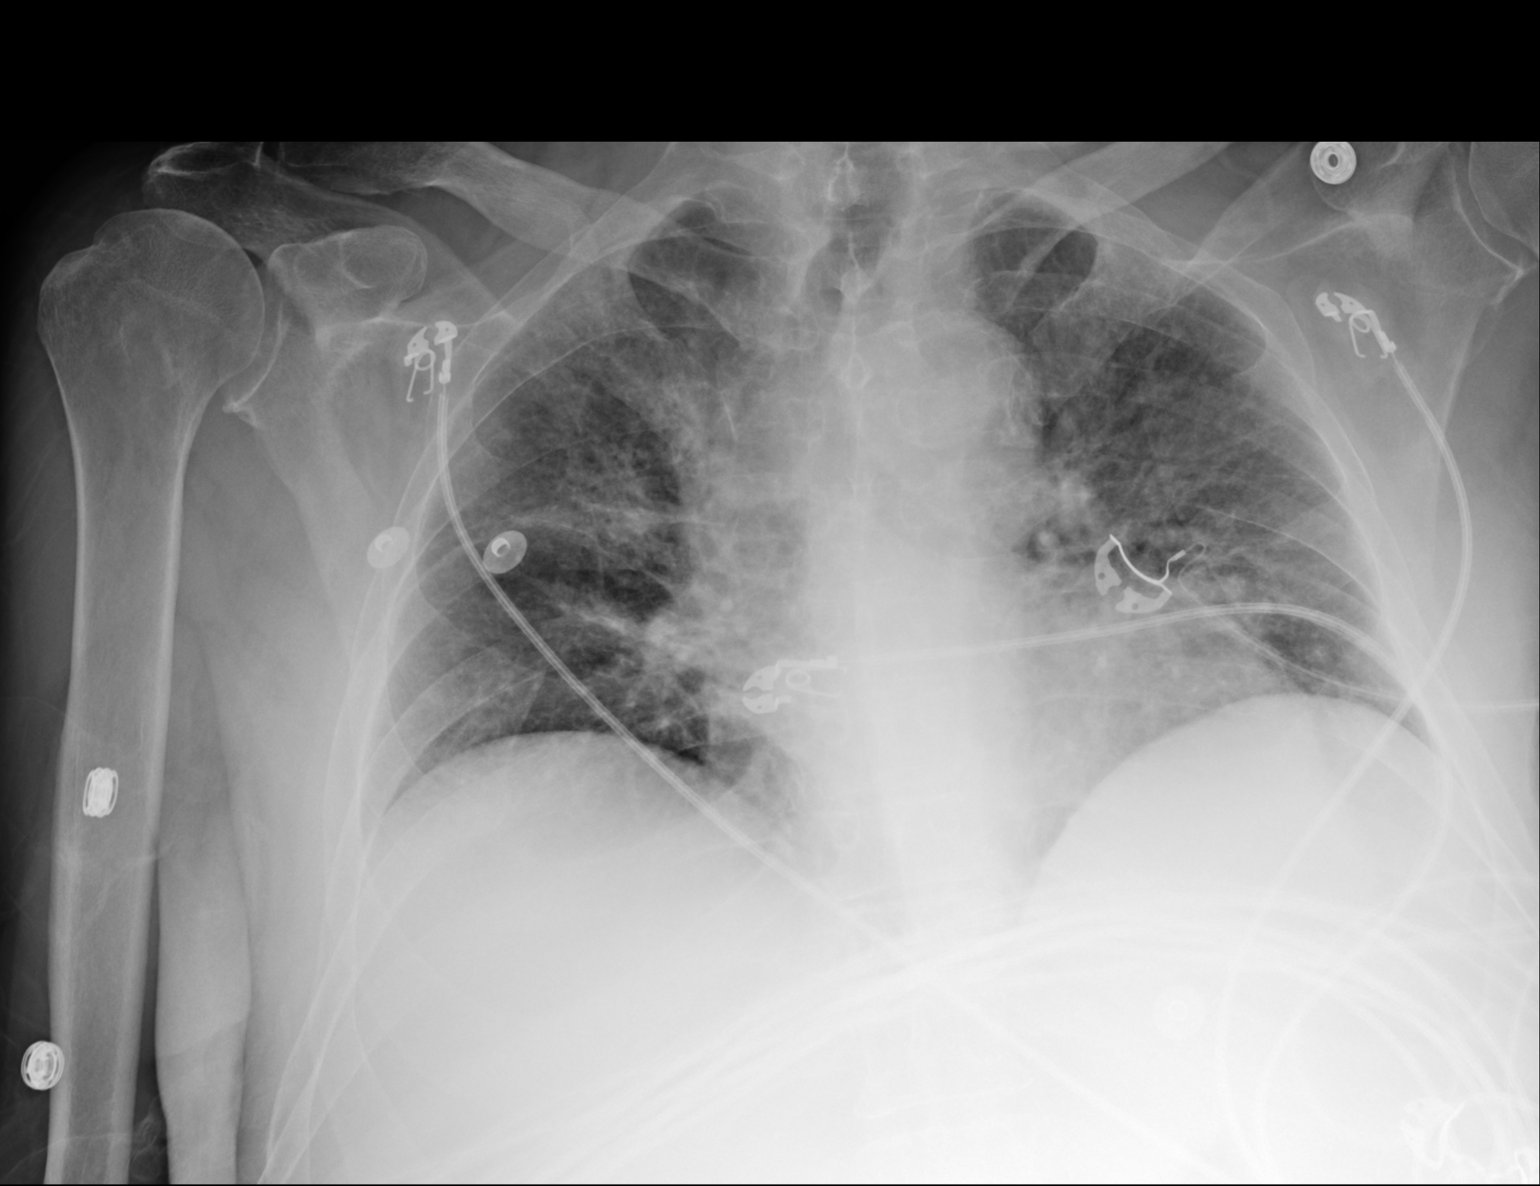

[1 of 1 positions shown; findings below may reference images not displayed]

FINDINGS: Mild cardiomegaly is stable from previous study. Mediastinal
silhouette within normal limits. Trach air column bowed to the right
about the aortic knob, stable.

Lungs are hypoinflated. Changes related COPD noted. Diffuse vascular
congestion with indistinctness of the interstitial markings,
suggestive of pulmonary interstitial edema. No frank pleural
effusion. Streaky perihilar opacities on the right favored to
reflect atelectasis. No consolidative airspace disease. No
pneumothorax.

No acute osseous abnormality.
IMPRESSION: 1. Diffuse vascular congestion with indistinctness of the
interstitial markings, suggestive of mild diffuse pulmonary
interstitial edema.
2. Shallow lung inflation with streaky right perihilar atelectatic
changes.

## 2016-12-01 LAB — CULTURE, BODY FLUID-BOTTLE: CULTURE: NO GROWTH

## 2016-12-01 LAB — CULTURE, BODY FLUID W GRAM STAIN -BOTTLE

## 2016-12-08 ENCOUNTER — Ambulatory Visit (HOSPITAL_COMMUNITY)
Admission: RE | Admit: 2016-12-08 | Discharge: 2016-12-08 | Disposition: A | Discharge status: Home / Self Care | Payer: Medicare Other | Source: Ambulatory Visit | Attending: Gastroenterology | Admitting: Gastroenterology

## 2016-12-08 ENCOUNTER — Other Ambulatory Visit: Payer: Self-pay

## 2016-12-08 ENCOUNTER — Encounter (HOSPITAL_COMMUNITY): Payer: Self-pay

## 2016-12-08 DIAGNOSIS — R188 Other ascites: Secondary | ICD-10-CM | POA: Insufficient documentation

## 2016-12-08 LAB — GRAM STAIN

## 2016-12-08 LAB — BODY FLUID CELL COUNT WITH DIFFERENTIAL
Eos, Fluid: 0 %
LYMPHS FL: 30 %
Monocyte-Macrophage-Serous Fluid: 65 % (ref 50–90)
NEUTROPHIL FLUID: 5 % (ref 0–25)
OTHER CELLS FL: 0 %
Total Nucleated Cell Count, Fluid: 53 cu mm (ref 0–1000)

## 2016-12-08 MED ORDER — ALBUMIN HUMAN 25 % IV SOLN
50.0000 g | Freq: Once | INTRAVENOUS | Status: AC
  Administered 2016-12-08: 50 g via INTRAVENOUS

## 2016-12-08 MED ORDER — ALBUMIN HUMAN 25 % IV SOLN
INTRAVENOUS | Status: AC
  Administered 2016-12-08: 50 g via INTRAVENOUS
  Filled 2016-12-08: qty 200

## 2016-12-08 NOTE — Procedures (Signed)
PreOperative Dx: Cirrhosis, ascites Postoperative Dx: Cirrhosis, ascites Procedure:   US guided paracentesis Radiologist:  Glora Hulgan Anesthesia:  10 ml of1% lidocaine Specimen:  10.2 L of yellow ascitic fluid EBL:   < 1 ml Complications: None  

## 2016-12-08 NOTE — Progress Notes (Signed)
Paracentesis complete no signs of distress. 12.3L yellow colored ascites removed.

## 2016-12-10 LAB — PATHOLOGIST SMEAR REVIEW

## 2016-12-14 LAB — CULTURE, BODY FLUID-BOTTLE: CULTURE: NO GROWTH

## 2016-12-14 LAB — CULTURE, BODY FLUID W GRAM STAIN -BOTTLE

## 2016-12-15 ENCOUNTER — Telehealth: Payer: Self-pay | Admitting: Internal Medicine

## 2016-12-15 ENCOUNTER — Observation Stay (HOSPITAL_COMMUNITY)
Admission: EM | Admit: 2016-12-15 | Discharge: 2016-12-17 | Disposition: A | Discharge status: Home / Self Care | Payer: Medicare Other | Attending: Family Medicine | Admitting: Family Medicine

## 2016-12-15 ENCOUNTER — Ambulatory Visit: Payer: Medicare Other | Admitting: Internal Medicine

## 2016-12-15 ENCOUNTER — Encounter: Payer: Self-pay | Admitting: Internal Medicine

## 2016-12-15 ENCOUNTER — Emergency Department (HOSPITAL_COMMUNITY): Payer: Medicare Other

## 2016-12-15 ENCOUNTER — Encounter (HOSPITAL_COMMUNITY): Payer: Self-pay | Admitting: Emergency Medicine

## 2016-12-15 DIAGNOSIS — E43 Unspecified severe protein-calorie malnutrition: Secondary | ICD-10-CM | POA: Diagnosis not present

## 2016-12-15 DIAGNOSIS — K766 Portal hypertension: Secondary | ICD-10-CM | POA: Insufficient documentation

## 2016-12-15 DIAGNOSIS — J449 Chronic obstructive pulmonary disease, unspecified: Secondary | ICD-10-CM | POA: Diagnosis not present

## 2016-12-15 DIAGNOSIS — Z7189 Other specified counseling: Secondary | ICD-10-CM

## 2016-12-15 DIAGNOSIS — Z515 Encounter for palliative care: Secondary | ICD-10-CM

## 2016-12-15 DIAGNOSIS — Z79899 Other long term (current) drug therapy: Secondary | ICD-10-CM | POA: Insufficient documentation

## 2016-12-15 DIAGNOSIS — Z87891 Personal history of nicotine dependence: Secondary | ICD-10-CM | POA: Diagnosis not present

## 2016-12-15 DIAGNOSIS — N179 Acute kidney failure, unspecified: Principal | ICD-10-CM | POA: Diagnosis present

## 2016-12-15 DIAGNOSIS — Z89412 Acquired absence of left great toe: Secondary | ICD-10-CM | POA: Diagnosis not present

## 2016-12-15 DIAGNOSIS — K7031 Alcoholic cirrhosis of liver with ascites: Secondary | ICD-10-CM | POA: Diagnosis not present

## 2016-12-15 DIAGNOSIS — E871 Hypo-osmolality and hyponatremia: Secondary | ICD-10-CM | POA: Insufficient documentation

## 2016-12-15 DIAGNOSIS — R188 Other ascites: Secondary | ICD-10-CM

## 2016-12-15 DIAGNOSIS — Z66 Do not resuscitate: Secondary | ICD-10-CM | POA: Diagnosis not present

## 2016-12-15 DIAGNOSIS — R1110 Vomiting, unspecified: Secondary | ICD-10-CM | POA: Diagnosis present

## 2016-12-15 DIAGNOSIS — B20 Human immunodeficiency virus [HIV] disease: Secondary | ICD-10-CM | POA: Diagnosis not present

## 2016-12-15 DIAGNOSIS — D649 Anemia, unspecified: Secondary | ICD-10-CM | POA: Diagnosis not present

## 2016-12-15 DIAGNOSIS — E039 Hypothyroidism, unspecified: Secondary | ICD-10-CM | POA: Insufficient documentation

## 2016-12-15 DIAGNOSIS — Z79891 Long term (current) use of opiate analgesic: Secondary | ICD-10-CM | POA: Insufficient documentation

## 2016-12-15 DIAGNOSIS — I129 Hypertensive chronic kidney disease with stage 1 through stage 4 chronic kidney disease, or unspecified chronic kidney disease: Secondary | ICD-10-CM | POA: Insufficient documentation

## 2016-12-15 DIAGNOSIS — D696 Thrombocytopenia, unspecified: Secondary | ICD-10-CM | POA: Diagnosis present

## 2016-12-15 DIAGNOSIS — N184 Chronic kidney disease, stage 4 (severe): Secondary | ICD-10-CM

## 2016-12-15 DIAGNOSIS — Z794 Long term (current) use of insulin: Secondary | ICD-10-CM | POA: Insufficient documentation

## 2016-12-15 DIAGNOSIS — R161 Splenomegaly, not elsewhere classified: Secondary | ICD-10-CM | POA: Diagnosis not present

## 2016-12-15 DIAGNOSIS — E1122 Type 2 diabetes mellitus with diabetic chronic kidney disease: Secondary | ICD-10-CM | POA: Insufficient documentation

## 2016-12-15 LAB — COMPREHENSIVE METABOLIC PANEL
ALK PHOS: 93 U/L (ref 38–126)
ALT: 26 U/L (ref 17–63)
AST: 36 U/L (ref 15–41)
Albumin: 2.6 g/dL — ABNORMAL LOW (ref 3.5–5.0)
Anion gap: 10 (ref 5–15)
BILIRUBIN TOTAL: 2.4 mg/dL — AB (ref 0.3–1.2)
BUN: 64 mg/dL — ABNORMAL HIGH (ref 6–20)
CALCIUM: 8.6 mg/dL — AB (ref 8.9–10.3)
CO2: 16 mmol/L — ABNORMAL LOW (ref 22–32)
CREATININE: 4.15 mg/dL — AB (ref 0.61–1.24)
Chloride: 99 mmol/L — ABNORMAL LOW (ref 101–111)
GFR calc non Af Amer: 14 mL/min — ABNORMAL LOW (ref >60)
GFR, EST AFRICAN AMERICAN: 16 mL/min — AB (ref >60)
GLUCOSE: 181 mg/dL — AB (ref 65–99)
Potassium: 4.8 mmol/L (ref 3.5–5.1)
Sodium: 125 mmol/L — ABNORMAL LOW (ref 135–145)
TOTAL PROTEIN: 5.6 g/dL — AB (ref 6.5–8.1)

## 2016-12-15 LAB — CBC WITH DIFFERENTIAL/PLATELET
BASOS PCT: 0 %
Basophils Absolute: 0 10*3/uL (ref 0.0–0.1)
EOS PCT: 0 %
Eosinophils Absolute: 0 10*3/uL (ref 0.0–0.7)
HCT: 26.9 % — ABNORMAL LOW (ref 39.0–52.0)
HEMOGLOBIN: 9.6 g/dL — AB (ref 13.0–17.0)
LYMPHS PCT: 7 %
Lymphs Abs: 0.8 10*3/uL (ref 0.7–4.0)
MCH: 31.1 pg (ref 26.0–34.0)
MCHC: 35.7 g/dL (ref 30.0–36.0)
MCV: 87.1 fL (ref 78.0–100.0)
Monocytes Absolute: 0.9 10*3/uL (ref 0.1–1.0)
Monocytes Relative: 8 %
NEUTROS PCT: 85 %
Neutro Abs: 9 10*3/uL — ABNORMAL HIGH (ref 1.7–7.7)
Platelets: 91 10*3/uL — ABNORMAL LOW (ref 150–400)
RBC: 3.09 MIL/uL — AB (ref 4.22–5.81)
RDW: 15.3 % (ref 11.5–15.5)
WBC: 10.7 10*3/uL — ABNORMAL HIGH (ref 4.0–10.5)

## 2016-12-15 LAB — AMMONIA: Ammonia: 62 umol/L — ABNORMAL HIGH (ref 9–35)

## 2016-12-15 LAB — PROTIME-INR
INR: 1.6
PROTHROMBIN TIME: 19.2 s — AB (ref 11.4–15.2)

## 2016-12-15 LAB — CBG MONITORING, ED: Glucose-Capillary: 174 mg/dL — ABNORMAL HIGH (ref 65–99)

## 2016-12-15 LAB — GLUCOSE, CAPILLARY
GLUCOSE-CAPILLARY: 132 mg/dL — AB (ref 65–99)
Glucose-Capillary: 134 mg/dL — ABNORMAL HIGH (ref 65–99)

## 2016-12-15 MED ORDER — ONDANSETRON HCL 4 MG/2ML IJ SOLN: 4.0000 mg | Freq: Four times a day (QID) | INTRAMUSCULAR | Status: DC | PRN

## 2016-12-15 MED ORDER — SODIUM CHLORIDE 0.9 % IV BOLUS (SEPSIS)
1000.0000 mL | Freq: Once | INTRAVENOUS | Status: AC
  Administered 2016-12-15: 1000 mL via INTRAVENOUS

## 2016-12-15 MED ORDER — ALBUTEROL SULFATE HFA 108 (90 BASE) MCG/ACT IN AERS: 2.0000 | INHALATION_SPRAY | Freq: Four times a day (QID) | RESPIRATORY_TRACT | Status: DC | PRN

## 2016-12-15 MED ORDER — TAMSULOSIN HCL 0.4 MG PO CAPS
0.4000 mg | ORAL_CAPSULE | Freq: Every day | ORAL | Status: DC
  Administered 2016-12-15 – 2016-12-17 (×3): 0.4 mg via ORAL
  Filled 2016-12-15 (×3): qty 1

## 2016-12-15 MED ORDER — SODIUM CHLORIDE 0.9 % IV SOLN
INTRAVENOUS | Status: DC
  Administered 2016-12-15 – 2016-12-16 (×2): via INTRAVENOUS

## 2016-12-15 MED ORDER — ALPRAZOLAM 0.5 MG PO TABS
0.5000 mg | ORAL_TABLET | Freq: Two times a day (BID) | ORAL | Status: DC | PRN
  Administered 2016-12-17: 0.5 mg via ORAL
  Filled 2016-12-15: qty 2
  Filled 2016-12-15: qty 1

## 2016-12-15 MED ORDER — ORAL CARE MOUTH RINSE
15.0000 mL | Freq: Two times a day (BID) | OROMUCOSAL | Status: DC
  Administered 2016-12-15 – 2016-12-17 (×2): 15 mL via OROMUCOSAL

## 2016-12-15 MED ORDER — INSULIN ASPART 100 UNIT/ML ~~LOC~~ SOLN
0.0000 [IU] | Freq: Three times a day (TID) | SUBCUTANEOUS | Status: DC
  Administered 2016-12-16 (×2): 1 [IU] via SUBCUTANEOUS

## 2016-12-15 MED ORDER — LEVOTHYROXINE SODIUM 100 MCG PO TABS
200.0000 ug | ORAL_TABLET | Freq: Every day | ORAL | Status: DC
  Administered 2016-12-16 – 2016-12-17 (×2): 200 ug via ORAL
  Filled 2016-12-15 (×2): qty 2

## 2016-12-15 MED ORDER — ALBUTEROL SULFATE (2.5 MG/3ML) 0.083% IN NEBU: 2.5000 mg | INHALATION_SOLUTION | Freq: Four times a day (QID) | RESPIRATORY_TRACT | Status: DC | PRN

## 2016-12-15 MED ORDER — PANTOPRAZOLE SODIUM 40 MG PO TBEC
40.0000 mg | DELAYED_RELEASE_TABLET | Freq: Every day | ORAL | Status: DC
  Administered 2016-12-15 – 2016-12-17 (×3): 40 mg via ORAL
  Filled 2016-12-15 (×3): qty 1

## 2016-12-15 MED ORDER — SPIRONOLACTONE 25 MG PO TABS: 25.0000 mg | ORAL_TABLET | Freq: Every day | ORAL | Status: DC

## 2016-12-15 MED ORDER — ROPINIROLE HCL 1 MG PO TABS
0.5000 mg | ORAL_TABLET | Freq: Every day | ORAL | Status: DC
  Administered 2016-12-15 – 2016-12-16 (×2): 0.5 mg via ORAL
  Filled 2016-12-15 (×2): qty 1

## 2016-12-15 MED ORDER — ONDANSETRON HCL 4 MG/2ML IJ SOLN
4.0000 mg | Freq: Once | INTRAMUSCULAR | Status: AC
  Administered 2016-12-15: 4 mg via INTRAVENOUS
  Filled 2016-12-15: qty 2

## 2016-12-15 MED ORDER — OXYCODONE HCL 5 MG PO TABS
10.0000 mg | ORAL_TABLET | Freq: Four times a day (QID) | ORAL | Status: DC | PRN
  Administered 2016-12-15 – 2016-12-17 (×2): 10 mg via ORAL
  Filled 2016-12-15 (×2): qty 2

## 2016-12-15 MED ORDER — ENSURE ENLIVE PO LIQD
237.0000 mL | Freq: Two times a day (BID) | ORAL | Status: DC
  Administered 2016-12-16 – 2016-12-17 (×3): 237 mL via ORAL

## 2016-12-15 MED ORDER — ONDANSETRON HCL 4 MG PO TABS: 4.0000 mg | ORAL_TABLET | Freq: Four times a day (QID) | ORAL | Status: DC | PRN

## 2016-12-15 MED ORDER — LACTULOSE 10 GM/15ML PO SOLN
20.0000 g | Freq: Once | ORAL | Status: AC
  Administered 2016-12-15: 20 g via ORAL
  Filled 2016-12-15: qty 30

## 2016-12-15 NOTE — ED Notes (Signed)
Noted pt's wife is with him currently.  Granddaughter went home.  Have noticed pt's wife is very forgetful and exhibits mild confusion at times.  Wife becomes very short of breath and has two tanks of oxygen with her.  Wife is adamant she is staying with her husband.  Floor nurse notified after attempting to get in touch with granddaughter, who forwarded calls to voicemail.

## 2016-12-15 NOTE — Telephone Encounter (Signed)
PATIENT WAS A NO SHOW AND LETTER SENT  °
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
## 2016-12-15 NOTE — ED Triage Notes (Signed)
n/v/d since last night. States emesis more than 10 times and diarrhea about 6 times. Mm dry. abd distention noted. Pt has regular paracentesis weekly. A/o. gen weakness noted.
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
## 2016-12-15 NOTE — Telephone Encounter (Signed)
[facility] [place] [facility] CALLED AND STATED THAT THE PATIENT IS GOING TO BE ADMITTED AND HAS BEEN IN THE ER TODAY.  HE WAS NOT A NO SHOW AND I TOLD HIM TO DISREGARD THE OFFICE SENT TO HIM

## 2016-12-16 ENCOUNTER — Encounter (HOSPITAL_COMMUNITY): Payer: Self-pay | Admitting: Primary Care

## 2016-12-16 DIAGNOSIS — N179 Acute kidney failure, unspecified: Secondary | ICD-10-CM

## 2016-12-16 DIAGNOSIS — Z7189 Other specified counseling: Secondary | ICD-10-CM | POA: Diagnosis not present

## 2016-12-16 DIAGNOSIS — Z515 Encounter for palliative care: Secondary | ICD-10-CM

## 2016-12-16 DIAGNOSIS — K7031 Alcoholic cirrhosis of liver with ascites: Secondary | ICD-10-CM | POA: Diagnosis not present

## 2016-12-16 DIAGNOSIS — E43 Unspecified severe protein-calorie malnutrition: Secondary | ICD-10-CM | POA: Diagnosis not present

## 2016-12-16 DIAGNOSIS — N184 Chronic kidney disease, stage 4 (severe): Secondary | ICD-10-CM

## 2016-12-16 DIAGNOSIS — D696 Thrombocytopenia, unspecified: Secondary | ICD-10-CM | POA: Diagnosis not present

## 2016-12-16 LAB — COMPREHENSIVE METABOLIC PANEL
ALT: 24 U/L (ref 17–63)
AST: 27 U/L (ref 15–41)
Albumin: 2.3 g/dL — ABNORMAL LOW (ref 3.5–5.0)
Alkaline Phosphatase: 76 U/L (ref 38–126)
Anion gap: 6 (ref 5–15)
BILIRUBIN TOTAL: 2 mg/dL — AB (ref 0.3–1.2)
BUN: 64 mg/dL — AB (ref 6–20)
CALCIUM: 8.4 mg/dL — AB (ref 8.9–10.3)
CHLORIDE: 104 mmol/L (ref 101–111)
CO2: 17 mmol/L — ABNORMAL LOW (ref 22–32)
CREATININE: 3.95 mg/dL — AB (ref 0.61–1.24)
GFR, EST AFRICAN AMERICAN: 17 mL/min — AB (ref >60)
GFR, EST NON AFRICAN AMERICAN: 14 mL/min — AB (ref >60)
Glucose, Bld: 132 mg/dL — ABNORMAL HIGH (ref 65–99)
Potassium: 5 mmol/L (ref 3.5–5.1)
Sodium: 127 mmol/L — ABNORMAL LOW (ref 135–145)
TOTAL PROTEIN: 5 g/dL — AB (ref 6.5–8.1)

## 2016-12-16 LAB — CBC WITH DIFFERENTIAL/PLATELET
Basophils Absolute: 0 10*3/uL (ref 0.0–0.1)
Basophils Relative: 0 %
EOS ABS: 0 10*3/uL (ref 0.0–0.7)
Eosinophils Relative: 0 %
HCT: 23.3 % — ABNORMAL LOW (ref 39.0–52.0)
HEMOGLOBIN: 8.5 g/dL — AB (ref 13.0–17.0)
LYMPHS ABS: 0.7 10*3/uL (ref 0.7–4.0)
Lymphocytes Relative: 8 %
MCH: 32.1 pg (ref 26.0–34.0)
MCHC: 36.5 g/dL — AB (ref 30.0–36.0)
MCV: 87.9 fL (ref 78.0–100.0)
MONO ABS: 1 10*3/uL (ref 0.1–1.0)
MONOS PCT: 13 %
NEUTROS PCT: 79 %
Neutro Abs: 6.6 10*3/uL (ref 1.7–7.7)
Platelets: 54 10*3/uL — ABNORMAL LOW (ref 150–400)
RBC: 2.65 MIL/uL — ABNORMAL LOW (ref 4.22–5.81)
RDW: 15.5 % (ref 11.5–15.5)
WBC: 8.3 10*3/uL (ref 4.0–10.5)

## 2016-12-16 LAB — CBC
HCT: 22.3 % — ABNORMAL LOW (ref 39.0–52.0)
Hemoglobin: 7.9 g/dL — ABNORMAL LOW (ref 13.0–17.0)
MCH: 31.5 pg (ref 26.0–34.0)
MCHC: 35.4 g/dL (ref 30.0–36.0)
MCV: 88.8 fL (ref 78.0–100.0)
PLATELETS: 60 10*3/uL — AB (ref 150–400)
RBC: 2.51 MIL/uL — AB (ref 4.22–5.81)
RDW: 14.6 % (ref 11.5–15.5)
WBC: 8.1 10*3/uL (ref 4.0–10.5)

## 2016-12-16 LAB — AMMONIA: AMMONIA: 28 umol/L (ref 9–35)

## 2016-12-16 LAB — GLUCOSE, CAPILLARY
Glucose-Capillary: 115 mg/dL — ABNORMAL HIGH (ref 65–99)
Glucose-Capillary: 138 mg/dL — ABNORMAL HIGH (ref 65–99)
Glucose-Capillary: 137 mg/dL — ABNORMAL HIGH (ref 65–99)
Glucose-Capillary: 141 mg/dL — ABNORMAL HIGH (ref 65–99)

## 2016-12-16 MED ORDER — PRO-STAT SUGAR FREE PO LIQD
30.0000 mL | Freq: Three times a day (TID) | ORAL | Status: DC
  Administered 2016-12-16 – 2016-12-17 (×2): 30 mL via ORAL
  Filled 2016-12-16 (×3): qty 30

## 2016-12-16 NOTE — Consult Note (Signed)
[name] [name] MRN: [id number] DOB/AGE: 1951-09-11 66 y.o. Primary Care Physician:Angel Barnett ApplebaumS Jones, PA-C Admit date: 12/15/2016 Chief Complaint:  Chief Complaint  Patient presents with  . Emesis   HPI: Pt is 66 year old male with past medical hx of Cirrhosis who came to ER with c/o Emesis  HPI dates back to 2-3 days  when pt started having emesis. Pt c/o having cough with expectoration Pt also c/o malaise Pt also c/o diarrhea  Pt was admitted for further care. Pt offers no c.o chest pain NO c/o fever/chills . Pt seen on 3rd floor, pt main concern continues to be " I feel weak"      Past Medical History:  Diagnosis Date  . Cirrhosis of liver (HCC)    patient was sent rx for Hep A and B vaccines. multiple para's.  Marland Kitchen. COPD (chronic obstructive pulmonary disease) (HCC)   . Diabetes mellitus   . High cholesterol   . HIV antibody positive (HCC) 2013   Evaluated by ID, negative for HIV. False positive  . Hypertension   . Hypothyroidism   . Portal hypertension (HCC)   . Splenomegaly   . Thrombocytopenia (HCC)         Family History  Problem Relation Age of Onset  . Liver disease Father     Cirrhosis r/t ETOH  . Stroke Mother   . COPD Sister   . Stroke Brother   . Diabetes Brother   . Colon cancer Neg Hx     Social History:  reports that he quit smoking about 31 years ago. He has a 50.00 pack-year smoking history. He has never used smokeless tobacco. He reports that he does not drink alcohol or use drugs.   Allergies: No Known Allergies  Medications Prior to Admission  Medication Sig Dispense Refill  . albuterol (PROVENTIL HFA;VENTOLIN HFA) 108 (90 Base) MCG/ACT inhaler Inhale 2 puffs into the lungs every 6 (six) hours as needed for wheezing or shortness of breath. 1 Inhaler 6  . ALPRAZolam (XANAX) 1 MG tablet Take 0.5-1 tablets (0.5-1 mg total) by mouth 2 (two) times daily as needed for anxiety. 30 tablet 5  . Cholecalciferol (VITAMIN D PO) Take 1 tablet by mouth daily.     . furosemide (LASIX) 20 MG tablet Take 20 mg by mouth daily.    Marland Kitchen. ibuprofen (ADVIL,MOTRIN) 200 MG tablet Take 400 mg by mouth daily as needed for headache.    . levothyroxine (SYNTHROID, LEVOTHROID) 200 MCG tablet TAKE ONE TABLET EACH MORNING BEFORE BREAKFAST 30 tablet 9  . magnesium oxide (MAGNESIUM-OXIDE) 400 (241.3 Mg) MG tablet Take 400 mg by mouth daily.     Marland Kitchen. omeprazole (PRILOSEC) 20 MG capsule Take 20 mg by mouth daily.     . Oxycodone HCl 10 MG TABS Take 1-2 tablets (10-20 mg total) by mouth 4 (four) times daily. 240 tablet 0  . rOPINIRole (REQUIP) 0.5 MG tablet Take 1 tablet (0.5 mg total) by mouth at bedtime. (Patient taking differently: Take 0.5-1 mg by mouth at bedtime. ) 30 tablet 6  . spironolactone (ALDACTONE) 25 MG tablet Take 25 mg by mouth daily.    . tamsulosin (FLOMAX) 0.4 MG CAPS capsule Take 1 capsule (0.4 mg total) by mouth daily after supper. 30 capsule 11  . HUMALOG KWIKPEN 100 UNIT/ML KiwkPen Inject 10-20 Units into the skin 3 (three) times daily before meals.    Marland Kitchen. LANTUS SOLOSTAR 100 UNIT/ML Solostar Pen Inject 50 Units into the skin daily.    .Marland Kitchen  nadolol (CORGARD) 40 MG tablet     . ONE TOUCH ULTRA TEST test strip     . ramipril (ALTACE) 5 MG capsule TAKE 1 CAPSULE ONCE A DAY FOR KIDNEYS (Patient not taking: Reported on 12/15/2016) 30 capsule 3       [id number] from the symptoms mentioned above,there are no other symptoms referable to all systems reviewed.  . feeding supplement (ENSURE ENLIVE)  237 mL Oral BID BM  . insulin aspart  0-9 Units Subcutaneous TID WC  . levothyroxine  200 mcg Oral QAC breakfast  . mouth rinse  15 mL Mouth Rinse BID  . pantoprazole  40 mg Oral Daily  . rOPINIRole  0.5 mg Oral QHS  . tamsulosin  0.4 mg Oral QPC supper       Physical Exam: Vital signs in last 24 hours: Temp:  [97.3 F (36.3 C)-98.2 F (36.8 C)] 97.3 F (36.3 C) (03/21 0500) Pulse Rate:  [76-86] 86 (03/21 0500) Resp:  [15-22] 18 (03/21 0500) BP:  (93-121)/(52-72) 103/65 (03/21 0500) SpO2:  [95 %-100 %] 99 % (03/21 0500) Weight:  [170 lb (77.1 kg)-170 lb 4.8 oz (77.2 kg)] 170 lb 4.8 oz (77.2 kg) (03/20 1559) Weight change:  Last BM Date: 12/15/16  Intake/Output from previous day: 03/20 0701 - 03/21 0700 In: 1455 [I.V.:455; IV Piggyback:1000] Out: 200 [Urine:200] No intake/output data recorded.   Physical Exam: General- pt is awake,alert, oriented to time place and person Resp- No acute REsp distress, CTA B/L NO Rhonchi CVS- S1S2 regular in rate and rhythm GIT- BS+, soft, NT, distended, Ascites +  EXT- NO LE Edema( much better), NOCyanosis CNS- CN 2-12 grossly intact. Moving all 4 extremities Psych- normal mood and affect    Lab Results: CBC  Recent Labs  12/15/16 1051 12/16/16 0533  WBC 10.7* 8.1  HGB 9.6* 7.9*  HCT 26.9* 22.3*  PLT 91* 60*    BMET  Recent Labs  12/15/16 1051 12/16/16 0533  NA 125* 127*  K 4.8 5.0  CL 99* 104  CO2 16* 17*  GLUCOSE 181* 132*  BUN 64* 64*  CREATININE 4.15* 3.95*  CALCIUM 8.6* 8.4*   Creat trend 2018  4.1=>3.9          2.8--3.1 ( baseline)  2017  3.1--3.7( 5.75 peak AKI) 2016  2.5--2.8   MICRO Recent Results (from the past 240 hour(s))  Culture, body fluid-bottle     Status: None   Collection Time: 12/08/16 11:06 AM  Result Value Ref Range Status   Specimen Description ASCITIC COLLECTED BY DOCTOR  Final   Special Requests BOTTLES DRAWN AEROBIC AND ANAEROBIC 10 CC EACH  Final   Culture NO GROWTH 6 DAYS  Final   Report Status 12/14/2016 FINAL  Final  Gram stain     Status: None   Collection Time: 12/08/16 11:06 AM  Result Value Ref Range Status   Specimen Description ASCITIC  Final   Special Requests NONE  Final   Gram Stain   Final    CYTOSPIN SMEAR NO ORGANISMS SEEN WBC PRESENT,BOTH PMN AND MONONUCLEAR    Report Status 12/08/2016 FINAL  Final      Lab Results  Component Value Date   PTH 64 12/11/2015   PTH Comment 12/11/2015   CALCIUM 8.4 (L)  12/16/2016   CAION 1.15 03/29/2016   PHOS 3.7 04/03/2016   Alb  2.3 Corrected Cal  8.4+ 1.4=9.8  Impression: 1)Renal  AKI secondary to Hypovolemia  AKI sec to low bp + ACE+ NSAIDS               AKI sec to Prerenal/ATN               AKI on CKD               CKD stage 4.               CKD since 2016               CKD secondary to [facility] ATN/ DM                Progression of CKD marked with multiple AKI                 2)HTN  Medication- On RAS blocker On iuretics  3)Anemia HGb low Admitted with possible GI bleed GI folowing  4)CKD Mineral-Bone Disorder PTH acceptable Secondary Hyperparathyroidism absent. Phosphorus at goal. Calcium at goal ( after correcting for low albumin)   5)Cirrhosis-admitted with hx of Ascites Pt most likley Will need tap  Primary  MD following  6)Electrolytes  normokalemic   Hyponatremic     Improving with IVF   7)Acid base Co2 not  at goal improving    Plan:  Agree with current iVF Agree with holding diuretics for now Will assess again in am to see if we need to restart Diuretics Agree with need of palliative care evaluation Pt will need albumin at the time of tap. Will follow BMET   Rita Vialpando S 12/16/2016, 9:26 AM

## 2016-12-16 NOTE — Consult Note (Signed)
Consultation Note Date: 12/16/2016   Patient Name: [name] [name]  DOB: August 02, 1951  MRN: [id number]  Age / Sex: 66 y.o., male  PCP: Terald Sleeper, PA-C Referring Physician: Eber Jones, MD  Reason for Consultation: Establishing goals of care and Psychosocial/spiritual support  HPI/Patient Profile: 66 y.o. 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   Clinical Assessment and Goals of Care: [name] [name] is resting quietly in bed. He greets me making but not keeping eye contact. He is able to tell me his name, but not the day months or year. Present today at bedside is his wife, [name] [name], who states that he does not keep up with the month, but does acknowledge that he is off when stating it is 2007.   We talk about the severity of [name] [name]'s illness, Including his cirrhosis of liver, COPD, and kidney disease. I also share my concerns over his functional decline. [name] states that he has been sleeping more over the last 2 to 3 weeks stating, "it's never been this bad". We talk about [name] [name] functional status. She shares that [name] [name] is "the backbone of the family". She states that he make sure everyone else's needs are met before his (2 grandchildren Jordan age 32, and Erlene Quan mid 75s, have lived with them since birth).  [name] also states that he does household chores, but when pushed for details she admits that he does not do housework, only brings in wood and keeps their fire going.  We talk about how we can best serve [name] [name] at home. I  share the benefits of hospice. [name] states that her sister had hospice and she is familiar with their services. We talk about a permanent Pleurex drain, so [name] [name] can be unburdened from returning to the hospital weekly ( for about 1 year now, per wife). She states this would also be of benefit. Family is accepting of returning home with hospice after a Pleurex drain is placed.  Healthcare power of attorney NEXT OF KIN - no paperwork completed, therefore wife [name] [name] is Media planner.   SUMMARY OF RECOMMENDATIONS   continue to treat the treatable but no extraordinary measures such as CPR or intubation. Family is requesting a Pleurex drain so they can return home with the benefits of hospice.  Code Status/Advance Care Planning:  DNR - wife [name] [name] confirms DNR status  Symptom Management:   per hospitalist, no additional needs at this time  Palliative Prophylaxis:   Frequent Pain Assessment and Turn Reposition  Additional Recommendations (Limitations, Scope, Preferences):  Treat the treatable but no extraordinary measures such as CPR or intubation, family desires home with hospice  Psycho-social/Spiritual:   Desire  for further Chaplaincy support:no  Additional Recommendations: Caregiving  Support/Resources and Education on Hospice  Prognosis:   < 3 months, would not be surprising based on functional decline, malnutrition, MELD score of 33.  Discharge Planning: Home with Hospice      Primary Diagnoses: Present on Admission: . AKI (acute kidney injury) ([place]) . Thrombocytopenia ([place]) . Alcoholic cirrhosis of liver with ascites ([place])   I have reviewed the medical record, interviewed the patient and family, and examined the patient. The following aspects are pertinent.  Past Medical History:  Diagnosis Date  . Cirrhosis of liver ([place])    patient was sent rx for Hep A and B vaccines. multiple para's.  Marland Kitchen COPD (chronic obstructive pulmonary disease) ([place])    . Diabetes mellitus   . High cholesterol   . 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 False positive  . Hypertension   . Hypothyroidism   . Portal hypertension ([place])   . Splenomegaly   . Thrombocytopenia [facility])    Social History   Social History  . Marital status: Married    Spouse name: N/A  . Number of children: N/A  . Years of education: N/A   Social History Main Topics  . Smoking status: Former Smoker    Packs/day: 2.00    Years: 25.00    Quit date: 07/03/1985  . Smokeless tobacco: Never Used  . Alcohol use No     Comment: former, would drink beer about 25 years ago. Not daily, didn't like the "taste" of it. Denies heavy drinking.   . Drug use: No  . Sexual activity: Not Currently    Birth control/ protection: None   Other Topics Concern  . None   Social History Narrative  . None   Family History  Problem Relation Age of Onset  . Liver disease Father     Cirrhosis r/t ETOH  . Stroke Mother   . COPD Sister   . Stroke Brother   . Diabetes Brother   . Colon cancer Neg Hx    Scheduled Meds: . feeding supplement (ENSURE ENLIVE)  237 mL Oral BID BM  . feeding supplement (PRO-STAT SUGAR FREE 64)  30 mL Oral TID BM  . insulin aspart  0-9 Units Subcutaneous TID WC  . levothyroxine  200 mcg Oral QAC breakfast  . mouth rinse  15 mL Mouth Rinse BID  . pantoprazole  40 mg Oral Daily  . rOPINIRole  0.5 mg Oral QHS  . tamsulosin  0.4 mg Oral QPC supper   Continuous Infusions: . sodium chloride 50 mL/hr at 12/16/16 0233   PRN Meds:.albuterol, ALPRAZolam, ondansetron **OR** ondansetron (ZOFRAN) IV, oxyCODONE Medications Prior to Admission:  Prior to Admission medications   Medication Sig Start Date End Date Taking? Authorizing Provider  albuterol (PROVENTIL HFA;VENTOLIN HFA) 108 (90 Base) MCG/ACT inhaler Inhale 2 puffs into the lungs every 6 (six) hours as needed for wheezing or shortness of breath. 11/24/16  Yes Terald Sleeper, PA-C    ALPRAZolam Duanne Moron) 1 MG tablet Take 0.5-1 tablets (0.5-1 mg total) by mouth 2 (two) times daily as needed for anxiety. 11/24/16  Yes Terald Sleeper, PA-C  Cholecalciferol (VITAMIN D PO) Take 1 tablet by mouth daily.   Yes Historical Provider, MD  furosemide (LASIX) 20 MG tablet Take 20 mg by mouth daily.   Yes Historical Provider, MD  ibuprofen (ADVIL,MOTRIN) 200 MG tablet Take 400 mg by mouth daily as needed for headache.  Yes Historical Provider, MD  levothyroxine (SYNTHROID, LEVOTHROID) 200 MCG tablet TAKE ONE TABLET EACH MORNING BEFORE BREAKFAST 09/18/16  Yes Terald Sleeper, PA-C  magnesium oxide (MAGNESIUM-OXIDE) 400 (241.3 Mg) MG tablet Take 400 mg by mouth daily.    Yes Historical Provider, MD  omeprazole (PRILOSEC) 20 MG capsule Take 20 mg by mouth daily.  11/13/16  Yes Historical Provider, MD  Oxycodone HCl 10 MG TABS Take 1-2 tablets (10-20 mg total) by mouth 4 (four) times daily. 11/24/16  Yes Terald Sleeper, PA-C  rOPINIRole (REQUIP) 0.5 MG tablet Take 1 tablet (0.5 mg total) by mouth at bedtime. Patient taking differently: Take 0.5-1 mg by mouth at bedtime.  05/14/16  Yes Terald Sleeper, PA-C  spironolactone (ALDACTONE) 25 MG tablet Take 25 mg by mouth daily.   Yes Historical Provider, MD  tamsulosin (FLOMAX) 0.4 MG CAPS capsule Take 1 capsule (0.4 mg total) by mouth daily after supper. 05/14/16  Yes Terald Sleeper, PA-C  HUMALOG KWIKPEN 100 UNIT/ML KiwkPen Inject 10-20 Units into the skin 3 (three) times daily before meals. 10/15/16   Historical Provider, MD  LANTUS SOLOSTAR 100 UNIT/ML Solostar Pen Inject 50 Units into the skin daily. 10/15/16   Historical Provider, MD  nadolol (CORGARD) 40 MG tablet  10/15/16   Historical Provider, MD  ONE TOUCH ULTRA TEST test strip  07/25/16   Historical Provider, MD  ramipril (ALTACE) 5 MG capsule TAKE 1 CAPSULE ONCE A DAY FOR KIDNEYS Patient not taking: Reported on 12/15/2016 10/15/16   Terald Sleeper, PA-C   No Known Allergies Review of Systems  Unable  to perform ROS: Acuity of condition    Physical Exam  Constitutional: No distress.  Chronically ill appearing, makes but does not keep eye contact  HENT:  Head: Normocephalic and atraumatic.  Cardiovascular: Normal rate and regular rhythm.   Pulmonary/Chest: Effort normal. No respiratory distress.  Abdominal: He exhibits distension. There is tenderness.  Full abdomen, ascites  Musculoskeletal: He exhibits edema.  Ascites, severe muscle wasting  Neurological: He is alert.  Oriented to self only, wife states he doesn't keep up with a day or the month, but he also gets the year wrong stating 2007  Skin: Skin is warm and dry.  Bilateral forearms covered in bruises, skin tears  Nursing note and vitals reviewed.   Vital Signs: BP 103/65 (BP Location: Left Arm)   Pulse 86   Temp 97.3 F (36.3 C) (Oral)   Resp 18   Ht _0  (1.753 m)   Wt 77.2 kg (170 lb 4.8 oz)   SpO2 99%   BMI 25.15 kg/m  Pain Assessment: No/denies pain   Pain Score: 10-Worst pain ever   SpO2: SpO2: 99 % O2 Device:SpO2: 99 % O2 Flow Rate: .   IO: Intake/output summary:  Intake/Output Summary (Last 24 hours) at 12/16/16 1540 Last data filed at 12/16/16 1100  Gross per 24 hour  Intake              575 ml  Output              200 ml  Net              375 ml    LBM: Last BM Date: 12/15/16 Baseline Weight: Weight: 77.1 kg (170 lb) Most recent weight: Weight: 77.2 kg (170 lb 4.8 oz)     Palliative Assessment/Data:   Flowsheet Rows     Most Recent Value  Intake Tab  Referral Department  Hospitalist  Unit at Time of Referral  Med/Surg Unit  Palliative Care Primary Diagnosis  Other (Comment) [Liver disease]  Date Notified  12/15/16  Palliative Care Type  New Palliative care  Reason for referral  Clarify Goals of Care  Date of Admission  12/15/16  Date first seen by Palliative Care  12/16/16  # of days Palliative referral response time  1 Day(s)  # of days IP prior to Palliative referral  0   Clinical Assessment  Palliative Performance Scale Score  30%  Pain Max last 24 hours  Not able to report  Pain Min Last 24 hours  Not able to report  Dyspnea Max Last 24 Hours  Not able to report  Dyspnea Min Last 24 hours  Not able to report  Psychosocial & Spiritual Assessment  Palliative Care Outcomes  Patient/Family meeting held?  Yes  Who was at the meeting?  Patient and wife [name] [name]  Patient/Family wishes: Interventions discontinued/not started   Mechanical Ventilation  Palliative Care follow-up planned  -- [Follow-up while at [facility]]      Time In: 0950 Time Out: 1105 Time Total: 75 minutes Greater than 50%  of this time was spent counseling and coordinating care related to the above assessment and plan.  Signed by: Drue Novel, NP   Please contact Palliative Medicine Team phone at [phone] for questions and concerns.  For individual provider: See Shea Evans

## 2016-12-16 NOTE — Progress Notes (Signed)
PROGRESS NOTE    [name] [name]  ZOX:[id number] DOB: 1950/12/21 DOA: 12/15/2016 PCP: [name], PA-C    Brief Narrative:  66 year old man PMH cirrhosis with associated splenomegaly, portal hypertension, thrombocytopenia, hyponatremia; diabetes mellitus, chronic kidney disease stage IV, presented to the emergency department with 2 days feeling poorly, coughing, vomiting, some diarrhea, some abd pain. Initial evaluation revealed acute kidney injury superimposed on chronic kidney disease, hyperammonemia.  Patient has been feeling poorly lately, coughing with posttussive vomiting, a few episodes of diarrhea described as watery and some generalized abdominal cramping and some leg cramping. Last paracentesis Friday. He's also has some associated shortness of breath. No specific aggravating or alleviating factors. Review of systems is generally unremarkable as below.    Assessment & Plan:   Principal Problem:   AKI (acute kidney injury) (HCC) Active Problems:   Thrombocytopenia (HCC)   Alcoholic cirrhosis of liver with ascites (HCC)   CKD (chronic kidney disease), stage IV (HCC)   Protein-calorie malnutrition, severe   Palliative care encounter   Goals of care, counseling/discussion   DNR (do not resuscitate) discussion   Encounter for hospice care discussion   Acute kidney injury superimposed on chronic kidney disease stage IV  - Borderline hypotension as well - Suspect secondary to dehydration from vomiting, diarrhea and poor oral intake. No evidence of infection. - Hold diuretics - Some concern for hepatorenal syndrome - Nephrology consultation- appreciate their recommendations  Decompensated cirrhosis with associated hyponatremia, thrombocytopenia, splenomegaly, portal hypertension, ascites, coagulopathy, hyperammonemia. - significant ascites today - MELD-NA 33 = 65-66% estimated 90 day mortality - IR paracentesis ordered for tomorrow (will need albumin at time of tap) -  Lactulose. Check ammonia level in AM. - Monitor clinically. - Palliative medicine consulted - patient to discharge home with hospice on 3/22  Vomiting, diarrhea, cough - Chest x-ray no acute disease - No history to suggest infection - Suspect viral GI illness.  - Supportive care, gentle IV fluids  Diabetes mellitus - Random blood sugar 181 - Anion gap normal. - Sliding scale insulin.  COPD appears stable.   DVT prophylaxis: SCDs Code Status: DNR Family Communication: Wife is bedside Disposition Plan: likely d/c home with hospice services tomorrow after paracentesis   Consultants:   Palliative Care  Nephrology  CM  Procedures:   Paracentesis scheduled for 3/22  Antimicrobials:   none    Subjective: Patient seen and evaluated.  He is disoriented and confused but awake.  Wife is bedside.  Palliative Care provider and I discussed home with hospice services. Patient wife agreeable and states he will need help when he goes home because of how weak he is.  Objective: Vitals:   12/15/16 2015 12/15/16 2100 12/16/16 0500 12/16/16 1500  BP:  103/70 103/65 105/72  Pulse:  78 86 87  Resp:  18 18 18   Temp:  98.2 F (36.8 C) 97.3 F (36.3 C) 97.6 F (36.4 C)  TempSrc:  Oral Oral Oral  SpO2: 95% 100% 99% 98%  Weight:      Height:        Intake/Output Summary (Last 24 hours) at 12/16/16 1813 Last data filed at 12/16/16 1400  Gross per 24 hour  Intake             1360 ml  Output                0 ml  Net             1360 ml   Ceasar Mons  Weights   12/15/16 1037 12/15/16 1559  Weight: 77.1 kg (170 lb) 77.2 kg (170 lb 4.8 oz)    Examination:  General exam: Appears calm and comfortable  Respiratory system: Clear to auscultation. Respiratory effort normal. Cardiovascular system: S1 & S2 heard, RRR. No JVD, murmurs, rubs, gallops or clicks. No pedal edema. Gastrointestinal system: Abdomen is nondistended, soft and nontender. No organomegaly or masses felt. Normal bowel  sounds heard. Central nervous system: Alert and oriented. No focal neurological deficits. Extremities: Symmetric 5 x 5 power. Skin: No rashes, lesions or ulcers Psychiatry: Judgement and insight appear normal. Mood & affect appropriate.     Data Reviewed: I have personally reviewed following labs and imaging studies  CBC:  Recent Labs Lab 12/15/16 1051 12/16/16 0533 12/16/16 1358  WBC 10.7* 8.1 8.3  NEUTROABS 9.0*  --  6.6  HGB 9.6* 7.9* 8.5*  HCT 26.9* 22.3* 23.3*  MCV 87.1 88.8 87.9  PLT 91* 60* 54*   Basic Metabolic Panel:  Recent Labs Lab 12/15/16 1051 12/16/16 0533  NA 125* 127*  K 4.8 5.0  CL 99* 104  CO2 16* 17*  GLUCOSE 181* 132*  BUN 64* 64*  CREATININE 4.15* 3.95*  CALCIUM 8.6* 8.4*   GFR: Estimated Creatinine Clearance: 18.1 mL/min (A) (by C-G formula based on SCr of 3.95 mg/dL (H)). Liver Function Tests:  Recent Labs Lab 12/15/16 1051 12/16/16 0533  AST 36 27  ALT 26 24  ALKPHOS 93 76  BILITOT 2.4* 2.0*  PROT 5.6* 5.0*  ALBUMIN 2.6* 2.3*   No results for input(s): LIPASE, AMYLASE in the last 168 hours.  Recent Labs Lab 12/15/16 1051 12/16/16 0536  AMMONIA 62* 28   Coagulation Profile:  Recent Labs Lab 12/15/16 1051  INR 1.60   Cardiac Enzymes: No results for input(s): CKTOTAL, CKMB, CKMBINDEX, TROPONINI in the last 168 hours. BNP (last 3 results) No results for input(s): PROBNP in the last 8760 hours. HbA1C: No results for input(s): HGBA1C in the last 72 hours. CBG:  Recent Labs Lab 12/15/16 1633 12/15/16 2133 12/16/16 0744 12/16/16 1133 12/16/16 1622  GLUCAP 132* 134* 115* 138* 137*   Lipid Profile: No results for input(s): CHOL, HDL, LDLCALC, TRIG, CHOLHDL, LDLDIRECT in the last 72 hours. Thyroid Function Tests: No results for input(s): TSH, T4TOTAL, FREET4, T3FREE, THYROIDAB in the last 72 hours. Anemia Panel: No results for input(s): VITAMINB12, FOLATE, FERRITIN, TIBC, IRON, RETICCTPCT in the last 72  hours. Sepsis Labs: No results for input(s): PROCALCITON, LATICACIDVEN in the last 168 hours.  Recent Results (from the past 240 hour(s))  Culture, body fluid-bottle     Status: None   Collection Time: 12/08/16 11:06 AM  Result Value Ref Range Status   Specimen Description ASCITIC COLLECTED BY DOCTOR  Final   Special Requests BOTTLES DRAWN AEROBIC AND ANAEROBIC 10 CC EACH  Final   Culture NO GROWTH 6 DAYS  Final   Report Status 12/14/2016 FINAL  Final  Gram stain     Status: None   Collection Time: 12/08/16 11:06 AM  Result Value Ref Range Status   Specimen Description ASCITIC  Final   Special Requests NONE  Final   Gram Stain   Final    CYTOSPIN SMEAR NO ORGANISMS SEEN WBC PRESENT,BOTH PMN AND MONONUCLEAR    Report Status 12/08/2016 FINAL  Final         Radiology Studies: Dg Chest Portable 1 View  Result Date: 12/15/2016 CLINICAL DATA:  Shortness of Breath EXAM: PORTABLE CHEST 1 VIEW  COMPARISON:  05/05/2016 FINDINGS: Cardiac shadow is stable. Increased perihilar infiltrates are noted bilaterally worsened from the prior exam. No sizable effusion is seen. No acute bony abnormality is noted. IMPRESSION: Increasing perihilar infiltrates bilaterally. Electronically Signed   By: Alcide CleverMark  Lukens M.D.   On: 12/15/2016 11:42        Scheduled Meds: . feeding supplement (ENSURE ENLIVE)  237 mL Oral BID BM  . feeding supplement (PRO-STAT SUGAR FREE 64)  30 mL Oral TID BM  . insulin aspart  0-9 Units Subcutaneous TID WC  . levothyroxine  200 mcg Oral QAC breakfast  . mouth rinse  15 mL Mouth Rinse BID  . pantoprazole  40 mg Oral Daily  . rOPINIRole  0.5 mg Oral QHS  . tamsulosin  0.4 mg Oral QPC supper   Continuous Infusions: . sodium chloride 50 mL/hr at 12/16/16 0233     LOS: 1 day    Time spent: 35 minutes    Katrinka BlazingAlex U Kadolph, MD [facility] Pager [phone]  If 7PM-7AM, please contact night-coverage [email or web address] Password [email or web address] 12/16/2016, 6:13 PM

## 2016-12-16 NOTE — Care Management Note (Signed)
Case Management Note  Patient Details  Name: [name] [name] MRN: [id number] Date of Birth: [birth year]  Subjective/Objective:                  Pt admitted with AKI. He is from home, lives with his wife. Pt has cirrhosis. Palliative is consulted and family is agreeable to pleurex catheter placement and hospice services at DC. [facility] Pt will need hospital bed. Hospice rep aware.  Action/Plan: Anticipate pleurex placement and [facility] home [facility] tomorrow. CM will cont to follow and coordinated DC needs.  Expected Discharge Date:     12/17/2016             Expected Discharge Plan:  Home w Hospice Care  In-House Referral:  Hospice / Palliative Care  Discharge planning Services  CM Consult  Post Acute Care Choice:  Durable Medical Equipment, Hospice Choice offered to:  Spouse  DME Arranged:  Hospital bed DME Agency[facility]  [facility]  [facility]  Status of Service:  In process, will continue to follow  Malcolm MetroChildress, Win Guajardo Demske, RN 12/16/2016, 2:46 PM

## 2016-12-16 NOTE — Care Management (Signed)
Patient Information   Patient Name [name] [name], [name] [name] ([id number]) Sex Male DOB May 09, 1951  Room Bed  A333 A333-01  Patient Demographics   Address [address] [place] [address] Phone [phone] [facility]) [phone] (Mobile)  Patient Ethnicity & Race   Ethnic Group Patient Race  Not Hispanic or Latino White or Caucasian  Emergency Contact(s)   Name Relation Home Work Mobile  Shain,Linda Spouse [phone]    Kellie Simmering [phone]  [phone]  Monia Sabal [phone]  [phone]  Documents on File    Status Date Received Description  Documents for the Patient  EMR Patient Summary  09/10/10   [facility] HIPAA NOTICE OF PRIVACY - Scanned Received 03/12/11   [place] E-Signature HIPAA Notice of Privacy Received 12/24/15   [place] E-Signature HIPAA Notice of Privacy Spanish     Driver's License Received 10/24/15   Insurance Card Received 03/12/11   Advance Directives/Living Will/HCPOA/POA Not Received    [place] HIPAA NOTICE OF PRIVACY - Scanned  03/12/11   Insurance Card  03/12/11   Release of Information  04/13/11   Financial Application Not Received    Insurance Card Received 06/07/12 aarp  HIM ROI Authorization  07/27/14   Release of Information  09/25/14   Other Photo ID Not Received    Insurance Card Received 07/04/15   Insurance Card Received 07/04/15 [facility] medicare  HIM ROI Authorization  09/03/15   HIM ROI Authorization  10/24/15   HIM ROI Authorization  11/27/15   HIM ROI Authorization (Expired) 01/16/16 ER records for hospital follow up appointment today.   Release of Information  01/17/16   AMB Outside Hospital Record  01/27/16 FAX NOTE [name]  AMB Correspondence  11/20/15 OFFICE NOTES  BHUTANI MD, M  HIM ROI Authorization (Expired) 03/10/16 Authorization for batch [employer] Medicare Risk Adjustment Review fbg 03/10/2016  Insurance Card Received 03/29/16 [facility] 2017   Consent Form     HIM ROI Authorization  04/30/16   Advanced Beneficiary Notice (ABN) Not Received    E-Signature AOB Spanish Not Received    Insurance Card Received 05/14/16 [facility] mcr/wrfm  Release of Information Received 08/31/16 rga  Radiology.   physician order  Consent Form   paracentesis  Release of Information Received 11/24/16 wrfm  AMB Outside Hospital Record (Deleted) 02/07/16 FAX NOTE [facility]  Documents for the Encounter  AOB (Assignment of Insurance Benefits) Not Received    E-signature AOB Signed 12/15/16   MEDICARE RIGHTS Not Received    E-signature Medicare Rights Signed 12/15/16   ED Patient Billing Extract   ED PB Summary  ED Patient Billing Extract   ED Encounter Summary  Admission Information   Attending Provider Admitting Provider Admission Type Admission Date/Time  Filbert Schilder, MD Standley Brooking, MD Emergency 12/15/16 1031  Discharge Date Hospital Service Auth/Cert Status Service Area   Internal Medicine Incomplete [place] SERVICE AREA  Unit Room/Bed Admission Status   AP-DEPT 300 A333/A333-01 Admission (Confirmed)   Admission   Complaint  emesis  Hospital Account   Name Acct ID Class Status Primary Coverage  Damion, Kant [id number] Inpatient Open UNITED HEALTHCARE MEDICARE - [facility] MEDICARE      Guarantor Account (for Hospital Account [id number])   Name Relation to Pt Service Area Active? Acct Type  Darl Householder Self [facility] Yes Personal/Family  Address Phone    [address] [place], [place] [address] [phone](H)        Coverage Information (for  Hospital Account [id number])   F/O Payor/Plan Precert #  [facility]ALTHCARE MEDICARE/UHC MEDICARE   Subscriber Subscriber #  Darl HouseholderChurch, Job L [id number]  Address Phone  [phone] [place]

## 2016-12-16 NOTE — Progress Notes (Signed)
Initial Nutrition Assessment  DOCUMENTATION CODES:  Severe malnutrition in context of acute illness/injury   Pt meets criteria for SEVERE MALNUTRITION in the context of CHRONIC ILLNESS as evidenced by loss of >20% bw x 1year and severe fat/muscle wasting.  INTERVENTION:  Ensure Enlive po BID, each supplement provides 350 kcal and 20 grams of protein  Will order 30 mL Prostat TID, each supplement provides 100 kcal and 15 grams of protein.  NUTRITION DIAGNOSIS:  Inadequate oral intake related to lethargy/confusion, poor appetite, chronic illness (cirrhosis), nausea as evidenced by loss of >20% bw x 1 year and severe fat/muscle wasting   GOAL:  Patient will meet greater than or equal to 90% of their needs  MONITOR:  PO intake, Supplement acceptance, Diet advancement, Labs, Weight trends, I & O's, Goals of care,  REASON FOR ASSESSMENT:  Malnutrition Screening Tool    ASSESSMENT:  66 y/o male PMHx Cirrhosis w/ associated Splenomegaly (weekly para), portal hypertension, DM, CKD4, COPD. Presented with 2 days of feeling poorly, n/v, diarrhea, abdominal pain, productive cough. Worked up for AKI on CKD and hyperammonemia.   On RD arrival, pt is somnolent, lethargic. All he said was "all I would drink at home was milk".   Wife at bedside. She is a poor historian .    She originally says the pt was eating very well at home. However, then talks about how he doesn't eat much.  "He will eat every bite of the food in front of him, but doesn't eat a lot".  Upon going through a dietary recall and asking about specific meals (stated he doesn't eat Breakfast, lunch, or even dinner), it turns out that the pt eats hardly anything at all. He sounds to just be seldom snacking throughout the day.    Patient did not take any vitamin, minerals, or oral nutritional supplements at home.   Per review of wt history in chart, pt's weight fluctuates due to his ascites and paracenteses, but definitely exhibits a  sharp decline in weight. Early 2017, his weight varied from 215-240 lbs. Just comparing the lowest weight from that time period until now reveals a 45 lb wt loss (>20 % of body weight)  At this time, wife reports the patient hasnt eaten anything since admission which is confirmed by documentation. He still sounds to have nausea and diarrhea.   Pt is unable to voice any foods he desires. RD stated to wife he would order a nutritional supplement.   Physical exam: Exhibits both severe muscle and fat wasting of upper and lower body.   Labs reviewed: Albumin:2.3, BGs 130s, H/H:7.9/22.3,  Medications: PPI, insulin, IVF   Recent Labs Lab 12/15/16 1051 12/16/16 0533  NA 125* 127*  K 4.8 5.0  CL 99* 104  CO2 16* 17*  BUN 64* 64*  CREATININE 4.15* 3.95*  CALCIUM 8.6* 8.4*  GLUCOSE 181* 132*   Diet Order:  Diet full liquid Room service appropriate? Yes; Fluid consistency: Thin  Skin:  Reviewed, no issues  Last BM:  3/20-diarrhea  Height:  Ht Readings from Last 1 Encounters:  12/15/16 5\' 9"  (1.753 m)   Weight:  Wt Readings from Last 1 Encounters:  12/15/16 170 lb 4.8 oz (77.2 kg)   Wt Readings from Last 10 Encounters:  12/15/16 170 lb 4.8 oz (77.2 kg)  11/24/16 192 lb (87.1 kg)  08/31/16 194 lb 12.8 oz (88.4 kg)  08/13/16 174 lb (78.9 kg)  08/10/16 188 lb (85.3 kg)  07/31/16 188 lb 3.2 oz (  85.4 kg)  07/10/16 176 lb 12.8 oz (80.2 kg)  05/14/16 194 lb (88 kg)  05/06/16 200 lb 14.4 oz (91.1 kg)  04/03/16 230 lb 9.6 oz (104.6 kg)   Ideal Body Weight:  72.73 kg  BMI:  Body mass index is 25.15 kg/m.  Estimated Nutritional Needs:  Kcal:  2100-2300 kcals (27-30 kcal/kg bw) Protein:  110-115g (1.4-1.6 g/kg ibw) Fluid:  Per MD  EDUCATION NEEDS:  No education needs identified at this time  Christophe LouisNathan Bernyce Brimley RD, LDN, CNSC Clinical Nutrition Pager: [phone] 12/16/2016 2:43 PM

## 2016-12-16 NOTE — Care Management (Signed)
Pt unable to get pleurex drain placed until next week. [facility] aware and can not initiate services until drain is placed. Pt [facility] agreeable [facility] getting [facility] pt can transition to hospice services. [facility] bed orders and [facility]. Will cont to follow.

## 2016-12-17 ENCOUNTER — Encounter (HOSPITAL_COMMUNITY): Payer: Self-pay

## 2016-12-17 ENCOUNTER — Inpatient Hospital Stay (HOSPITAL_COMMUNITY): Payer: Medicare Other

## 2016-12-17 DIAGNOSIS — Z7189 Other specified counseling: Secondary | ICD-10-CM | POA: Diagnosis not present

## 2016-12-17 DIAGNOSIS — N184 Chronic kidney disease, stage 4 (severe): Secondary | ICD-10-CM | POA: Diagnosis not present

## 2016-12-17 DIAGNOSIS — Z515 Encounter for palliative care: Secondary | ICD-10-CM | POA: Diagnosis not present

## 2016-12-17 DIAGNOSIS — K7031 Alcoholic cirrhosis of liver with ascites: Secondary | ICD-10-CM | POA: Diagnosis not present

## 2016-12-17 DIAGNOSIS — N179 Acute kidney failure, unspecified: Secondary | ICD-10-CM | POA: Diagnosis not present

## 2016-12-17 LAB — GLUCOSE, CAPILLARY
Glucose-Capillary: 115 mg/dL — ABNORMAL HIGH (ref 65–99)
Glucose-Capillary: 138 mg/dL — ABNORMAL HIGH (ref 65–99)
Glucose-Capillary: 123 mg/dL — ABNORMAL HIGH (ref 65–99)

## 2016-12-17 MED ORDER — ALBUMIN HUMAN 25 % IV SOLN
50.0000 g | Freq: Once | INTRAVENOUS | Status: AC
  Administered 2016-12-17: 50 g via INTRAVENOUS
  Filled 2016-12-17: qty 200

## 2016-12-17 MED ORDER — ENSURE ENLIVE PO LIQD: 237.0000 mL | Freq: Two times a day (BID) | ORAL | 0 refills | Status: DC

## 2016-12-17 NOTE — Care Management CC44 (Signed)
Condition Code 44 Documentation Completed  Patient Details  Name: [name] [name] MRN: [id number] Date of Birth: 06-19-1951   Condition Code 44 given:  Yes Patient signature on Condition Code 44 notice:  Yes Documentation of 2 MD's agreement:  Yes Code 44 added to claim:  Yes    Malcolm Metrohildress, Roshon Duell Demske, RN 12/17/2016, 3:26 PM

## 2016-12-17 NOTE — Progress Notes (Signed)
Daily Progress Note   Patient Name: [name] [name]       Date: 12/17/2016 DOB: June 14, 1951  Age: 66 y.o. MRN#: [id number] Attending Physician: Filbert Schilder, MD Primary Care Physician: [name], PA-C Admit Date: 12/15/2016  Reason for Consultation/Follow-up: Establishing goals of care and Psychosocial/spiritual support  Subjective: [name] [name] is resting quietly in bed. He is able to make eye contact when I call his name. He is somewhat lethargic today. His wife, Ramez [name], is at bedside. We talk about paracentesis later today then discharge home. Both Mr. and Mrs. Vorce have limited health literacy and continue to benefit from reinforcement of teaching and information. [name] continues to be an agreement for home with hospice, she understands that [name] [name] needs a permanent to drain that must be placed at Hospital. Multiple calls to interventional radiology, spoke with Morrie Sheldon at [facility] IR who states that they have no room on the schedule, suggesting that I call [facility] as outpatient at [phone]. IR at [facility] states they have no availability for outpatient PleureX placement until 12/28/16. No appointment made at this time.   Length of Stay: 2  Current Medications: Scheduled Meds:  . albumin human  50 g Intravenous Once  . feeding supplement (ENSURE ENLIVE)  237 mL Oral BID BM  . feeding supplement (PRO-STAT SUGAR FREE 64)  30 mL Oral TID BM  . insulin aspart  0-9 Units Subcutaneous TID WC  . levothyroxine  200 mcg Oral QAC breakfast  . mouth rinse  15 mL Mouth Rinse BID  . pantoprazole  40 mg Oral Daily  . rOPINIRole  0.5 mg Oral QHS  . tamsulosin  0.4 mg Oral QPC supper    Continuous Infusions: . sodium chloride 50 mL/hr at 12/16/16 0233    PRN Meds: albuterol,  ALPRAZolam, ondansetron **OR** ondansetron (ZOFRAN) IV, oxyCODONE  Physical Exam  Constitutional: No distress.  Sleepy, but wakes to command  HENT:  Head: Normocephalic and atraumatic.  Cardiovascular: Normal rate and regular rhythm.   Pulmonary/Chest: Effort normal. No respiratory distress.  Abdominal: He exhibits distension. There is tenderness.  Musculoskeletal: He exhibits edema.  Neurological:  Sleepy, but wakes when I call his name  Skin: Skin is warm and dry.  Jaundiced  Nursing note and vitals reviewed.  Vital Signs: BP (!) 96/49 (BP Location: Right Arm) Comment: notified rn  Pulse 83   Temp 98.2 F (36.8 C) (Oral)   Resp 18   Ht 5\' 9"  (1.753 m)   Wt 77.2 kg (170 lb 4.8 oz)   SpO2 96%   BMI 25.15 kg/m  SpO2: SpO2: 96 % O2 Device: O2 Device: Not Delivered O2 Flow Rate:    Intake/output summary:  Intake/Output Summary (Last 24 hours) at 12/17/16 1312 Last data filed at 12/17/16 [id number]  Gross per 24 hour  Intake              370 ml  Output              300 ml  Net               70 ml   LBM: Last BM Date: 12/15/16 Baseline Weight: Weight: 77.1 kg (170 lb) Most recent weight: Weight: 77.2 kg (170 lb 4.8 oz)       Palliative Assessment/Data:    Flowsheet Rows     Most Recent Value  Intake Tab  Referral Department  Hospitalist  Unit at Time of Referral  Med/Surg Unit  Palliative Care Primary Diagnosis  Other (Comment) [Liver disease]  Date Notified  12/15/16  Palliative Care Type  New Palliative care  Reason for referral  Clarify Goals of Care  Date of Admission  12/15/16  Date first seen by Palliative Care  12/16/16  # of days Palliative referral response time  1 Day(s)  # of days IP prior to Palliative referral  0  Clinical Assessment  Palliative Performance Scale Score  30%  Pain Max last 24 hours  Not able to report  Pain Min Last 24 hours  Not able to report  Dyspnea Max Last 24 Hours  Not able to report  Dyspnea Min Last 24 hours  Not  able to report  Psychosocial & Spiritual Assessment  Palliative Care Outcomes  Patient/Family meeting held?  Yes  Who was at the meeting?  Patient and wife Jule EconomyLinda [name]  Patient/Family wishes: Interventions discontinued/not started   Mechanical Ventilation  Palliative Care follow-up planned  -- [Follow-up while at [facility]]      Patient Active Problem List   Diagnosis Date Noted  . Protein-calorie malnutrition, severe 12/16/2016  . Palliative care encounter   . Goals of care, counseling/discussion   . DNR (do not resuscitate) discussion   . Encounter for hospice care discussion   . AKI (acute kidney injury) (HCC) 12/15/2016  . CKD (chronic kidney disease), stage IV (HCC) 12/15/2016  . DDD (degenerative disc disease), lumbar 11/24/2016  . Alcoholic cirrhosis of liver with ascites (HCC)   . Hepatic encephalopathy (HCC) 05/05/2016  . Bleeding esophageal varices (HCC)   . C. difficile colitis   . Hypokalemia 01/07/2016  . Chronic renal failure, stage 5 (HCC) 01/07/2016  . Vomiting and diarrhea 10/24/2015  . Osteomyelitis (HCC) 10/08/2015  . Diabetic foot infection (HCC) 10/08/2015  . Secondary esophageal varices without bleeding (HCC)   . Cirrhosis of liver with ascites (HCC) 08/15/2015  . Thrombocytopenia (HCC) 05/15/2012  . Hypothyroidism 05/15/2012    Palliative Care Assessment & Plan   Patient Profile: 66 y.o. 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   Assessment: AKI; improved with gentle hydration, some concern for how to renal syndrome. Decompensated cirrhosis; MELD score 33 equals 66% mortality in 90 days family is accepting  of returning home with hospice, but this is impeded by hospice requirement that [name] [name] have PleureX before being accepted into service  Recommendations/Plan:  PleureX placed by IR via outpatient next week, then on to in-home services with hospice of [facility].  Goals of Care and Additional Recommendations:  Limitations on Scope of Treatment: Treat the treatable but no extraordinary measures, attempt outpatient PleureX then in-home hospice services.  Code Status:    Code Status Orders        Start     Ordered   12/15/16 1825  Do not attempt resuscitation (DNR)  Continuous    Question Answer Comment  In the event of cardiac or respiratory ARREST Do not call a "code blue"   In the event of cardiac or respiratory ARREST Do not perform Intubation, CPR, defibrillation or ACLS   In the event of cardiac or respiratory ARREST Use medication by any route, position, wound care, and other measures to relive pain and suffering. May use oxygen, suction and manual treatment of airway obstruction as needed for comfort.      12/15/16 1824    Code Status History    Date Active Date Inactive Code Status Order ID Comments User Context   12/15/2016  5:52 PM 12/15/2016  6:24 PM Full Code [id number]  Standley Brooking, MD Inpatient   05/06/2016  4:01 AM 05/07/2016  5:42 PM DNR [id number]  Timothy Lasso, RN Inpatient   03/30/2016 12:03 AM 04/04/2016  6:47 PM Partial Code [id number]  [name], MD Inpatient   01/07/2016  7:32 PM 01/11/2016  2:47 PM Full Code [id number]  [name], MD Inpatient   10/14/2015 12:37 PM 10/15/2015  7:21 PM Full Code [id number]  [name], MD Inpatient   10/08/2015  6:29 PM 10/14/2015 12:37 PM Full Code [id number]  [name], MD Inpatient   08/15/2015  6:28 PM 08/19/2015  4:57 PM Full Code [id number]  [name], MD ED   05/15/2012  7:32 PM 05/20/2012  9:14 PM Full Code [id number]  [name], RN Inpatient       Prognosis:   < 3 months would not be  surprising based on functional decline, malnutrition, MELD score of 33.  Discharge Planning:  Home with home health service initially.  PleureX placed by IR via outpatient next week, then on to in-home services with hospice of [facility].  Care plan was discussed with nursing staff, case manager, social worker, and Dr. Rinaldo Ratel  Thank you for allowing the Palliative Medicine Team to assist in the care of this patient.   Time In: 1010 Time Out: 1045 Total Time 35 minutes Prolonged Time Billed  no       Greater than 50%  of this time was spent counseling and coordinating care related to the above assessment and plan.  Katheran Awe, NP  Please contact Palliative Medicine Team phone at [phone] for questions and concerns.

## 2016-12-17 NOTE — Progress Notes (Signed)
Paracentesis complete no signs of distress. 7L  Cloudy yellow ascites removed.

## 2016-12-17 NOTE — Procedures (Signed)
PreOperative Dx: Alcoholic cirrhosis, ascites Postoperative Dx: Alcoholic cirrhosis, ascites Procedure:   US guided paracentesis Radiologist:  Tkeya Stencil Anesthesia:  10 ml of1% lidocaine Specimen:  7 L of yellow ascitic fluid EBL:   < 1 ml Complications: None  

## 2016-12-17 NOTE — Progress Notes (Signed)
[name] [name]  MRN: [id number]  DOB/AGE: [birth year] 66 y.o.  Primary Care Physician:Angel Barnett Applebaum, PA-C  Admit date: 12/15/2016  Chief Complaint:  Chief Complaint  Patient presents with  . Emesis    S-Pt presented on  12/15/2016 with  Chief Complaint  Patient presents with  . Emesis  .    Pt offers no new complaints.   Meds  . feeding supplement (ENSURE ENLIVE)  237 mL Oral BID BM  . feeding supplement (PRO-STAT SUGAR FREE 64)  30 mL Oral TID BM  . insulin aspart  0-9 Units Subcutaneous TID WC  . levothyroxine  200 mcg Oral QAC breakfast  . mouth rinse  15 mL Mouth Rinse BID  . pantoprazole  40 mg Oral Daily  . rOPINIRole  0.5 mg Oral QHS  . tamsulosin  0.4 mg Oral QPC supper       Physical Exam: Vital signs in last 24 hours: Temp:  [97.6 F (36.4 C)-98.2 F (36.8 C)] 98.2 F (36.8 C) (03/22 4098) Pulse Rate:  [83-87] 83 (03/22 0613) Resp:  [18] 18 (03/22 1191) BP: (96-105)/(49-72) 96/49 (03/22 4782) SpO2:  [95 %-100 %] 96 % (03/22 9562) Weight change:  Last BM Date: 12/15/16  Intake/Output from previous day: 03/21 0701 - 03/22 0700 In: 1030 [P.O.:480; I.V.:550] Out: 300 [Urine:300] Total I/O In: 50 [P.O.:50] Out: -    Physical Exam: General- pt is awake,alert, oriented to time place and person Resp- No acute REsp distress, CTA B/L NO Rhonchi CVS- S1S2 regular in rate and rhythm GIT- BS+, soft, NT, Distended EXT- NO LE Edema, Cyanosis   Lab Results: CBC  Recent Labs  12/16/16 0533 12/16/16 1358  WBC 8.1 8.3  HGB 7.9* 8.5*  HCT 22.3* 23.3*  PLT 60* 54*    BMET  Recent Labs  12/15/16 1051 12/16/16 0533  NA 125* 127*  K 4.8 5.0  CL 99* 104  CO2 16* 17*  GLUCOSE 181* 132*  BUN 64* 64*  CREATININE 4.15* 3.95*  CALCIUM 8.6* 8.4*    Creat trend 2018  4.1=>3.9          2.8--3.1 ( baseline)  2017  3.1--3.7( 5.75 peak AKI) 2016  2.5--2.8   MICRO Recent Results (from the past 240 hour(s))  Culture, body fluid-bottle     Status:  None   Collection Time: 12/08/16 11:06 AM  Result Value Ref Range Status   Specimen Description ASCITIC COLLECTED BY DOCTOR  Final   Special Requests BOTTLES DRAWN AEROBIC AND ANAEROBIC 10 CC EACH  Final   Culture NO GROWTH 6 DAYS  Final   Report Status 12/14/2016 FINAL  Final  Gram stain     Status: None   Collection Time: 12/08/16 11:06 AM  Result Value Ref Range Status   Specimen Description ASCITIC  Final   Special Requests NONE  Final   Gram Stain   Final    CYTOSPIN SMEAR NO ORGANISMS SEEN WBC PRESENT,BOTH PMN AND MONONUCLEAR    Report Status 12/08/2016 FINAL  Final      Lab Results  Component Value Date   PTH 64 12/11/2015   PTH Comment 12/11/2015   CALCIUM 8.4 (L) 12/16/2016   CAION 1.15 03/29/2016   PHOS 3.7 04/03/2016               Impression: )Renal  AKI secondary to Hypovolemia               AKI sec to low bp + ACE+ NSAIDS  AKI sec to Prerenal/ATN               AKI on CKD               CKD stage 4.               CKD since 2016               [facility]                Progression of CKD marked with multiple AKI                 2)HTN  Medication- On RAS blocker On iuretics  3)Anemia HGb low Admitted with possible GI bleed GI folowing  4)CKD Mineral-Bone Disorder PTH acceptable Secondary Hyperparathyroidism absent. Phosphorus at goal. Calcium at goal ( after correcting for low albumin)   5)Cirrhosis-admitted with hx of Ascites Pt most likley Will need tap  Primary  MD following  6)Electrolytes  normokalemic   Hyponatremic     Improving with IVF   7)Acid base Co2 not  at goal improving  8) Social-Pt being d/ced home with Hospice possibly   Awaiting/planning pleurodex placement   Plan:  Will sign off Please call if any query      [name] S 12/17/2016, 1:56 PM

## 2016-12-17 NOTE — Care Management Note (Signed)
Case Management Note  Patient Details  Name: [name] [name] MRN: [id number] Date of Birth: October 25, 1950  Expected Discharge Date:       12/17/2016           Expected Discharge Plan:  Home w Home Health Services  In-House Referral:  Hospice / Palliative Care  Discharge planning Services  CM Consult  Post Acute Care Choice:  Durable Medical Equipment, Hospice Choice offered to:  Spouse  DME Arranged:  Hospital bed DME Agency:  [facility]  [facility]  Status of Service:  Completed, signed off  Additional Comments: [facility], per spouse choice. Jermaine, [facility] rep, aware of referral and will obtain pt info from chart. Family aware HH has 48 hrs to make first visit. [facility] and EMS transport will be arranged.  Malcolm Metrohildress, Laurelin Elson Demske, RN 12/17/2016, 4:22 PM

## 2016-12-17 NOTE — Discharge Summary (Signed)
Physician Discharge Summary  [name] [name] QMV:[id number] DOB: 06/18/1951 DOA: 12/15/2016  PCP: Remus LofflerAngel S Jones, PA-C  Admit date: 12/15/2016 Discharge date: 12/17/2016  Admitted From: Home  Disposition:  Home  Recommendations for Outpatient Follow-up:  1. Follow up with home health to schedule pleurex drainage 2. Home health services to be set up follow by hospice services  Home Health: Yes- RN/ Aide/ SW Equipment/Devices: Bed  Discharge Condition: - Guarded, to start outpatient hospice after pleurex drain placed CODE STATUS: DNR Diet recommendation: As tolerated   Brief/Interim Summary: 66 year old man PMH cirrhosis with associated splenomegaly, portal hypertension, thrombocytopenia, hyponatremia;diabetes mellitus, chronic kidney disease stage IV, presented to the emergency department with 2 days feeling poorly, coughing, vomiting, some diarrhea, some abd pain. Initial evaluation revealed acute kidney injury superimposed on chronic kidney disease,hyperammonemia.  Patient has been feeling poorly lately, coughing with posttussive vomiting, a few episodes of diarrhea described as watery and some generalized abdominal cramping and some leg cramping. Last paracentesis Friday. He's also has some associated shortness of breath. No specific aggravating or alleviating factors. Review of systems is generally unremarkable as below.  Patient was seen by Nephrology who recommend holding diuretics and giving albumin at time of paracentesis.  Palliative care consulted and discussed with wife the severity of patient's illness as well as his decline.  Wife agreed to home hospice care.  Case management assisted in getting patient a hospital bed delivered to his house.  Hospice services requesting pleurex catheter placement prior to discharge but unfortunately due to scheduling could not be arranged.  Paracentesis performed and patient will need outpatient pleurex catheter insertion.  Patient was discharged with  home health services to transition to hospice services after pleurex placed.  Discharge Diagnoses:  Principal Problem:   AKI (acute kidney injury) (HCC) Active Problems:   Thrombocytopenia (HCC)   Alcoholic cirrhosis of liver with ascites (HCC)   CKD (chronic kidney disease), stage IV (HCC)   Protein-calorie malnutrition, severe   Palliative care encounter   Goals of care, counseling/discussion   DNR (do not resuscitate) discussion   Encounter for hospice care discussion    Discharge Instructions  Discharge Instructions    Bed rest    Complete by:  As directed    Call MD for:  difficulty breathing, headache or visual disturbances    Complete by:  As directed    Call MD for:  extreme fatigue    Complete by:  As directed    Call MD for:  hives    Complete by:  As directed    Call MD for:  persistant dizziness or light-headedness    Complete by:  As directed    Call MD for:  persistant nausea and vomiting    Complete by:  As directed    Call MD for:  severe uncontrolled pain    Complete by:  As directed    Call MD for:  temperature >100.4    Complete by:  As directed    Diet general    Complete by:  As directed      Allergies as of 12/17/2016   No Known Allergies     Medication List    STOP taking these medications   furosemide 20 MG tablet Commonly known as:  LASIX   HUMALOG KWIKPEN 100 UNIT/ML KiwkPen Generic drug:  insulin lispro   ibuprofen 200 MG tablet Commonly known as:  ADVIL,MOTRIN   LANTUS SOLOSTAR 100 UNIT/ML Solostar Pen Generic drug:  Insulin Glargine   ONE  TOUCH ULTRA TEST test strip Generic drug:  glucose blood   ramipril 5 MG capsule Commonly known as:  ALTACE   spironolactone 25 MG tablet Commonly known as:  ALDACTONE     TAKE these medications   albuterol 108 (90 Base) MCG/ACT inhaler Commonly known as:  PROVENTIL HFA;VENTOLIN HFA Inhale 2 puffs into the lungs every 6 (six) hours as needed for wheezing or shortness of breath.    ALPRAZolam 1 MG tablet Commonly known as:  XANAX Take 0.5-1 tablets (0.5-1 mg total) by mouth 2 (two) times daily as needed for anxiety.   feeding supplement (ENSURE ENLIVE) Liqd Take 237 mLs by mouth 2 (two) times daily between meals.   levothyroxine 200 MCG tablet Commonly known as:  SYNTHROID, LEVOTHROID TAKE ONE TABLET EACH MORNING BEFORE BREAKFAST   MAGNESIUM-OXIDE 400 (241.3 Mg) MG tablet Generic drug:  magnesium oxide Take 400 mg by mouth daily.   nadolol 40 MG tablet Commonly known as:  CORGARD   omeprazole 20 MG capsule Commonly known as:  PRILOSEC Take 20 mg by mouth daily.   Oxycodone HCl 10 MG Tabs Take 1-2 tablets (10-20 mg total) by mouth 4 (four) times daily.   rOPINIRole 0.5 MG tablet Commonly known as:  REQUIP Take 1 tablet (0.5 mg total) by mouth at bedtime. What changed:  how much to take   tamsulosin 0.4 MG Caps capsule Commonly known as:  FLOMAX Take 1 capsule (0.4 mg total) by mouth daily after supper.   VITAMIN D PO Take 1 tablet by mouth daily.            Durable Medical Equipment        Start     Ordered   12/16/16 1529  For home use only DME Hospital bed  Once    Question Answer Comment  Patient has (list medical condition): cirrhosis   The above medical condition requires: Patient requires the ability to reposition frequently   Head must be elevated greater than: 30 degrees   Bed type Semi-electric   Hoyer Lift Yes   Trapeze Bar Yes      12/16/16 1530      No Known Allergies  Consultations:  Nephrology  Palliative Care   Procedures/Studies: US Paracentesis  Result Date: 12/17/2016 INDICATION: Alcoholic cirrhosis, ascites EXAM: ULTRASOUND GUIDED THERAPEUTIC PARACENTESIS MEDICATIONS: None. COMPLICATIONS: None immediate. PROCEDURE: Procedure, benefits, and risks of procedure were discussed with patient. Written informed consent for procedure was obtained. Time out protocol followed. Adequate collection of ascites  localized by ultrasound in RIGHT lower quadrant. Skin prepped and draped in usual sterile fashion. Skin and soft tissues anesthetized with 10 mL of 1% lidocaine. 5 [place] Yueh catheter placed into peritoneal cavity. 7 L of yellow ascitic fluid aspirated by vacuum bottle suction. Procedure tolerated well by patient without immediate complication. FINDINGS: As above IMPRESSION: Successful ultrasound-guided paracentesis yielding 7 liters of peritoneal fluid. Electronically Signed   By: Ulyses Southward M.D.   On: 12/17/2016 15:16   US Paracentesis  Result Date: 12/08/2016 INDICATION: Cirrhosis, ascites EXAM: ULTRASOUND GUIDED DIAGNOSTIC AND THERAPEUTIC PARACENTESIS MEDICATIONS: None. COMPLICATIONS: None immediate. PROCEDURE: Procedure, benefits, and risks of procedure were discussed with patient. Written informed consent for procedure was obtained. Time out protocol followed. Adequate collection of ascites localized by ultrasound in RIGHT lower quadrant. Skin prepped and draped in usual sterile fashion. Skin and soft tissues anesthetized with 10 mL of 1% lidocaine. 5 [place] Yueh catheter placed into peritoneal cavity. 10.2 L of yellow ascitic fluid aspirated  by vacuum bottle suction. Procedure tolerated well by patient without immediate complication. FINDINGS: A total of approximately 10.2 L of ascitic fluid was removed. Samples were sent to the laboratory as requested by the clinical team. IMPRESSION: Successful ultrasound-guided paracentesis yielding 10.2 liters of peritoneal fluid. Electronically Signed   By: Ulyses Southward M.D.   On: 12/08/2016 13:36   US Paracentesis  Result Date: 11/26/2016 INDICATION: Cirrhosis, ascites EXAM: ULTRASOUND GUIDED DIAGNOSTIC AND THERAPEUTIC PARACENTESIS MEDICATIONS: None. COMPLICATIONS: None immediate. PROCEDURE: Procedure, benefits, and risks of procedure were discussed with patient. Written informed consent for procedure was obtained. Time out protocol followed. Adequate collection  of ascites localized by ultrasound in RIGHT lower quadrant. Skin prepped and draped in usual sterile fashion. Skin and soft tissues anesthetized with 10 mL of 1% lidocaine. 5 [place] Yueh catheter placed into peritoneal cavity. 8.5 L of yellow ascitic fluid aspirated by vacuum bottle suction. Procedure tolerated well by patient without immediate complication. FINDINGS: A total of approximately 8.5 L of ascitic fluid was removed. Samples were sent to the laboratory as requested by the clinical team. IMPRESSION: Successful ultrasound-guided paracentesis yielding 8.5 liters of peritoneal fluid. Electronically Signed   By: Ulyses Southward M.D.   On: 11/26/2016 12:40   Dg Chest Portable 1 View  Result Date: 12/15/2016 CLINICAL DATA:  Shortness of Breath EXAM: PORTABLE CHEST 1 VIEW COMPARISON:  05/05/2016 FINDINGS: Cardiac shadow is stable. Increased perihilar infiltrates are noted bilaterally worsened from the prior exam. No sizable effusion is seen. No acute bony abnormality is noted. IMPRESSION: Increasing perihilar infiltrates bilaterally. Electronically Signed   By: Alcide Clever M.D.   On: 12/15/2016 11:42        Subjective: Patient confused.  Not eating much.  Sleeping most of the day.  Discharge Exam: Vitals:   12/17/16 1358 12/17/16 1439  BP: 104/70 94/68  Pulse: 84 82  Resp: 20 20  Temp:     Vitals:   12/16/16 2111 12/17/16 0613 12/17/16 1358 12/17/16 1439  BP: (!) 99/55 (!) 96/49 104/70 94/68  Pulse: 85 83 84 82  Resp: 18 18 20 20   Temp: 97.8 F (36.6 C) 98.2 F (36.8 C)    TempSrc: Oral Oral    SpO2: 100% 96% 98% 98%  Weight:      Height:        General: Pt is not in acute distress, drowsy, cachectic, jaundiced male Cardiovascular: RRR, S1/S2 +, no rubs, no gallops Respiratory: CTA bilaterally, no wheezing, no rhonchi Abdominal: distended, soft, NT, bowel sounds + Extremities: no edema, no cyanosis    The results of significant diagnostics from this hospitalization  (including imaging, microbiology, ancillary and laboratory) are listed below for reference.     Microbiology: Recent Results (from the past 240 hour(s))  Culture, body fluid-bottle     Status: None   Collection Time: 12/08/16 11:06 AM  Result Value Ref Range Status   Specimen Description ASCITIC COLLECTED BY DOCTOR  Final   Special Requests BOTTLES DRAWN AEROBIC AND ANAEROBIC 10 CC EACH  Final   Culture NO GROWTH 6 DAYS  Final   Report Status 12/14/2016 FINAL  Final  Gram stain     Status: None   Collection Time: 12/08/16 11:06 AM  Result Value Ref Range Status   Specimen Description ASCITIC  Final   Special Requests NONE  Final   Gram Stain   Final    CYTOSPIN SMEAR NO ORGANISMS SEEN WBC PRESENT,BOTH PMN AND MONONUCLEAR    Report Status 12/08/2016 FINAL  Final     Labs: BNP (last 3 results)  Recent Labs  01/07/16 1453  BNP 94.0   Basic Metabolic Panel:  Recent Labs Lab 12/15/16 1051 12/16/16 0533  NA 125* 127*  K 4.8 5.0  CL 99* 104  CO2 16* 17*  GLUCOSE 181* 132*  BUN 64* 64*  CREATININE 4.15* 3.95*  CALCIUM 8.6* 8.4*   Liver Function Tests:  Recent Labs Lab 12/15/16 1051 12/16/16 0533  AST 36 27  ALT 26 24  ALKPHOS 93 76  BILITOT 2.4* 2.0*  PROT 5.6* 5.0*  ALBUMIN 2.6* 2.3*   No results for input(s): LIPASE, AMYLASE in the last 168 hours.  Recent Labs Lab 12/15/16 1051 12/16/16 0536  AMMONIA 62* 28   CBC:  Recent Labs Lab 12/15/16 1051 12/16/16 0533 12/16/16 1358  WBC 10.7* 8.1 8.3  NEUTROABS 9.0*  --  6.6  HGB 9.6* 7.9* 8.5*  HCT 26.9* 22.3* 23.3*  MCV 87.1 88.8 87.9  PLT 91* 60* 54*   Cardiac Enzymes: No results for input(s): CKTOTAL, CKMB, CKMBINDEX, TROPONINI in the last 168 hours. BNP: Invalid input(s): POCBNP CBG:  Recent Labs Lab 12/16/16 1622 12/16/16 2108 12/17/16 0736 12/17/16 1112 12/17/16 1620  GLUCAP 137* 141* 115* 138* 123*   D-Dimer No results for input(s): DDIMER in the last 72 hours. Hgb A1c No  results for input(s): HGBA1C in the last 72 hours. Lipid Profile No results for input(s): CHOL, HDL, LDLCALC, TRIG, CHOLHDL, LDLDIRECT in the last 72 hours. Thyroid function studies No results for input(s): TSH, T4TOTAL, T3FREE, THYROIDAB in the last 72 hours.  Invalid input(s): FREET3 Anemia work up No results for input(s): VITAMINB12, FOLATE, FERRITIN, TIBC, IRON, RETICCTPCT in the last 72 hours. Urinalysis    Component Value Date/Time   COLORURINE YELLOW 01/07/2016 1655   APPEARANCEUR CLEAR 01/07/2016 1655   LABSPEC 1.010 01/07/2016 1655   PHURINE 5.0 01/07/2016 1655   GLUCOSEU NEGATIVE 01/07/2016 1655   HGBUR SMALL (A) 01/07/2016 1655   BILIRUBINUR NEGATIVE 01/07/2016 1655   KETONESUR NEGATIVE 01/07/2016 1655   PROTEINUR NEGATIVE 01/07/2016 1655   UROBILINOGEN 0.2 05/15/2012 1425   NITRITE NEGATIVE 01/07/2016 1655   LEUKOCYTESUR NEGATIVE 01/07/2016 1655   Sepsis Labs Invalid input(s): PROCALCITONIN,  WBC,  LACTICIDVEN Microbiology Recent Results (from the past 240 hour(s))  Culture, body fluid-bottle     Status: None   Collection Time: 12/08/16 11:06 AM  Result Value Ref Range Status   Specimen Description ASCITIC COLLECTED BY DOCTOR  Final   Special Requests BOTTLES DRAWN AEROBIC AND ANAEROBIC 10 CC EACH  Final   Culture NO GROWTH 6 DAYS  Final   Report Status 12/14/2016 FINAL  Final  Gram stain     Status: None   Collection Time: 12/08/16 11:06 AM  Result Value Ref Range Status   Specimen Description ASCITIC  Final   Special Requests NONE  Final   Gram Stain   Final    CYTOSPIN SMEAR NO ORGANISMS SEEN WBC PRESENT,BOTH PMN AND MONONUCLEAR    Report Status 12/08/2016 FINAL  Final     Time coordinating discharge: Over 30 minutes  SIGNED:   Katrinka Blazing, MD  [facility] 12/17/2016, 4:42 PM Pager [phone] If 7PM-7AM, please contact night-coverage [email or web address] Password [email or web address]

## 2016-12-22 ENCOUNTER — Telehealth: Payer: Self-pay | Admitting: Physician Assistant

## 2016-12-22 ENCOUNTER — Other Ambulatory Visit: Payer: Self-pay | Admitting: Physician Assistant

## 2016-12-22 DIAGNOSIS — F419 Anxiety disorder, unspecified: Secondary | ICD-10-CM

## 2016-12-22 NOTE — Telephone Encounter (Signed)
This is a patient under Hospice care at home, so is he getting meds through them?

## 2016-12-22 NOTE — Telephone Encounter (Signed)
Pharmacy aware

## 2016-12-22 NOTE — Telephone Encounter (Signed)
[facility] that it is okay to fill

## 2016-12-22 NOTE — Telephone Encounter (Signed)
Son states that patient has not been seen by Hospice yet.

## 2016-12-23 ENCOUNTER — Other Ambulatory Visit (HOSPITAL_COMMUNITY): Payer: Self-pay | Admitting: Interventional Radiology

## 2016-12-23 ENCOUNTER — Other Ambulatory Visit: Payer: Self-pay

## 2016-12-23 DIAGNOSIS — R188 Other ascites: Secondary | ICD-10-CM

## 2016-12-23 NOTE — Telephone Encounter (Signed)
[facility] 

## 2016-12-24 ENCOUNTER — Other Ambulatory Visit: Payer: Self-pay | Admitting: Radiology

## 2016-12-24 ENCOUNTER — Other Ambulatory Visit: Payer: Self-pay | Admitting: Student

## 2016-12-24 NOTE — Care Management (Signed)
[facility] [place] [facility] inquiring about pt's referrals at time of DC. [facility] and [facility], per [facility], they reached out to pt but grandson refused initiation of care at this time. [facility] contacted [facility] who stated they could not initiate services until pleurex cath placed. Per Beverely LowJeannie this has been scheduled early April but she is unsure if pt can get transportation. CM has reached out to [facility] rep. They will attempt to contact pt again and establish care for case management.

## 2016-12-25 ENCOUNTER — Inpatient Hospital Stay (HOSPITAL_COMMUNITY): Admission: RE | Admit: 2016-12-25 | Payer: Medicare Other | Source: Ambulatory Visit

## 2016-12-29 ENCOUNTER — Other Ambulatory Visit: Payer: Self-pay

## 2016-12-29 ENCOUNTER — Telehealth (HOSPITAL_COMMUNITY): Payer: Self-pay

## 2016-12-29 ENCOUNTER — Telehealth: Payer: Self-pay | Admitting: Internal Medicine

## 2016-12-29 DIAGNOSIS — R188 Other ascites: Secondary | ICD-10-CM

## 2016-12-29 NOTE — Telephone Encounter (Signed)
Grandson called to see if we could make appt for patient to have a para tomorrow morning and he said that the patient needed to have procedure to have a tube placed in his side. Please call [phone]

## 2016-12-29 NOTE — Telephone Encounter (Signed)
Para scheduled for tomorrow at 1:15pm. Tried to call grandson at [phone], voicemail full and unable to leave message. Called a phone number listed for pt. Pt's granddaughter Baxter Hire) answered phone. Informed of para being scheduled for tomorrow. Granddaughter said pt is bedridden and unable to ride in a car. He has to be transported by EMS. He was scheduled to have procedure done last Friday to have tube inserted so they can drain fluid at home. However, the procedure had to be rescheduled d/t confusion with arrival time that day. He is scheduled at [facility] 12/30/16 to have tube inserted so he does not need para done. Called central scheduling and cancelled para.  Forwarding to LSL as FYI.

## 2016-12-29 NOTE — Telephone Encounter (Signed)
Left a message for [name] [name] from [facility] to return call to schedule pt for pleurx cath. AW

## 2016-12-30 ENCOUNTER — Ambulatory Visit (HOSPITAL_COMMUNITY): Payer: Medicare Other

## 2016-12-30 ENCOUNTER — Other Ambulatory Visit: Payer: Self-pay | Admitting: Radiology

## 2016-12-30 NOTE — Telephone Encounter (Signed)
Noted  

## 2016-12-31 ENCOUNTER — Encounter (HOSPITAL_COMMUNITY): Payer: Self-pay

## 2016-12-31 ENCOUNTER — Ambulatory Visit (HOSPITAL_COMMUNITY)
Admission: RE | Admit: 2016-12-31 | Discharge: 2016-12-31 | Disposition: A | Discharge status: Home / Self Care | Payer: Medicare Other | Source: Ambulatory Visit | Attending: Physician Assistant | Admitting: Physician Assistant

## 2016-12-31 ENCOUNTER — Telehealth: Payer: Self-pay | Admitting: Internal Medicine

## 2016-12-31 DIAGNOSIS — K766 Portal hypertension: Secondary | ICD-10-CM | POA: Diagnosis not present

## 2016-12-31 DIAGNOSIS — K7031 Alcoholic cirrhosis of liver with ascites: Secondary | ICD-10-CM | POA: Insufficient documentation

## 2016-12-31 DIAGNOSIS — E039 Hypothyroidism, unspecified: Secondary | ICD-10-CM | POA: Insufficient documentation

## 2016-12-31 DIAGNOSIS — R188 Other ascites: Secondary | ICD-10-CM

## 2016-12-31 DIAGNOSIS — Z21 Asymptomatic human immunodeficiency virus [HIV] infection status: Secondary | ICD-10-CM | POA: Insufficient documentation

## 2016-12-31 DIAGNOSIS — Z89412 Acquired absence of left great toe: Secondary | ICD-10-CM | POA: Diagnosis not present

## 2016-12-31 DIAGNOSIS — J449 Chronic obstructive pulmonary disease, unspecified: Secondary | ICD-10-CM | POA: Insufficient documentation

## 2016-12-31 DIAGNOSIS — Z87891 Personal history of nicotine dependence: Secondary | ICD-10-CM | POA: Insufficient documentation

## 2016-12-31 DIAGNOSIS — E7800 Pure hypercholesterolemia, unspecified: Secondary | ICD-10-CM | POA: Diagnosis not present

## 2016-12-31 DIAGNOSIS — Z823 Family history of stroke: Secondary | ICD-10-CM | POA: Diagnosis not present

## 2016-12-31 DIAGNOSIS — E119 Type 2 diabetes mellitus without complications: Secondary | ICD-10-CM | POA: Insufficient documentation

## 2016-12-31 DIAGNOSIS — I10 Essential (primary) hypertension: Secondary | ICD-10-CM | POA: Insufficient documentation

## 2016-12-31 HISTORY — PX: IR PERC TUN PERIT CATH WO PORT S&I /IMAG: IMG2327

## 2016-12-31 LAB — APTT: APTT: 47 s — AB (ref 24–36)

## 2016-12-31 LAB — GLUCOSE, CAPILLARY
GLUCOSE-CAPILLARY: 110 mg/dL — AB (ref 65–99)
Glucose-Capillary: 129 mg/dL — ABNORMAL HIGH (ref 65–99)

## 2016-12-31 LAB — PROTIME-INR
INR: 1.81
PROTHROMBIN TIME: 21.2 s — AB (ref 11.4–15.2)

## 2016-12-31 LAB — CBC
HCT: 21.9 % — ABNORMAL LOW (ref 39.0–52.0)
HEMOGLOBIN: 7.7 g/dL — AB (ref 13.0–17.0)
MCH: 30.6 pg (ref 26.0–34.0)
MCHC: 35.2 g/dL (ref 30.0–36.0)
MCV: 86.9 fL (ref 78.0–100.0)
Platelets: 74 10*3/uL — ABNORMAL LOW (ref 150–400)
RBC: 2.52 MIL/uL — ABNORMAL LOW (ref 4.22–5.81)
RDW: 16.1 % — ABNORMAL HIGH (ref 11.5–15.5)
WBC: 5.3 10*3/uL (ref 4.0–10.5)

## 2016-12-31 MED ORDER — FENTANYL CITRATE (PF) 100 MCG/2ML IJ SOLN
INTRAMUSCULAR | Status: AC | PRN
  Administered 2016-12-31: 12.5 ug via INTRAVENOUS
  Administered 2016-12-31: 25 ug via INTRAVENOUS

## 2016-12-31 MED ORDER — CEFAZOLIN SODIUM-DEXTROSE 2-4 GM/100ML-% IV SOLN: 2.0000 g | Freq: Once | INTRAVENOUS | Status: DC

## 2016-12-31 MED ORDER — FENTANYL CITRATE (PF) 100 MCG/2ML IJ SOLN
INTRAMUSCULAR | Status: AC
  Filled 2016-12-31: qty 2

## 2016-12-31 MED ORDER — SODIUM CHLORIDE 0.9 % IV SOLN: INTRAVENOUS | Status: DC

## 2016-12-31 MED ORDER — LIDOCAINE HCL (PF) 1 % IJ SOLN
INTRAMUSCULAR | Status: AC | PRN
  Administered 2016-12-31: 10 mL

## 2016-12-31 MED ORDER — CEFAZOLIN SODIUM-DEXTROSE 2-4 GM/100ML-% IV SOLN: 2.0000 g | Freq: Two times a day (BID) | INTRAVENOUS | Status: DC

## 2016-12-31 MED ORDER — ALBUMIN HUMAN 25 % IV SOLN
25.0000 g | Freq: Once | INTRAVENOUS | Status: AC
Start: 2016-12-31 — End: 2016-12-31
  Administered 2016-12-31: 25 g via INTRAVENOUS
  Filled 2016-12-31: qty 100

## 2016-12-31 NOTE — Procedures (Signed)
Tunneled peritoneal drain catheter placed, with paracentesis  No complication No blood loss. See complete dictation in [facility].

## 2016-12-31 NOTE — Sedation Documentation (Signed)
Patient is resting comfortably. 

## 2016-12-31 NOTE — Telephone Encounter (Signed)
Letter mailed

## 2016-12-31 NOTE — Discharge Instructions (Signed)
SEE PLEUREX CATH BOOKLET WITH VIDEO HOSPICE TO MAINTAIN Paracentesis, Care After Refer to this sheet in the next few weeks. These instructions provide you with information about caring for yourself after your procedure. Your health care provider may also give you more specific instructions. Your treatment has been planned according to current medical practices, but problems sometimes occur. Call your health care provider if you have any problems or questions after your procedure. What can I expect after the procedure? After your procedure, it is common to have a small amount of clear fluid coming from the puncture site. Follow these instructions at home:  Return to your normal activities as told by your health care provider. Ask your health care provider what activities are safe for you.  Take over-the-counter and prescription medicines only as told by your health care provider.  Do not take baths, swim, or use a hot tub until your health care provider approves.  Follow instructions from your health care provider about:  How to take care of your puncture site.  When and how you should change your bandage (dressing).  When you should remove your dressing.  Check your puncture area every day signs of infection. Watch for:  Redness, swelling, or pain.  Fluid, blood, or pus.  Keep all follow-up visits as told by your health care provider. This is important. Contact a health care provider if:  You have redness, swelling, or pain at your puncture site.  You start to have more clear fluid coming from your puncture site.  You have blood or pus coming from your puncture site.  You have chills.  You have a fever. Get help right away if:  You develop chest pain or shortness of breath.  You develop increasing pain, discomfort, or swelling in your abdomen.  You feel dizzy or light-headed or you pass out. This information is not intended to replace advice given to you by your health  care provider. Make sure you discuss any questions you have with your health care provider. Document Released: 01/29/2015 Document Revised: 02/20/2016 Document Reviewed: 11/27/2014 Elsevier Interactive Patient Education  2017 [employer].
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
## 2016-12-31 NOTE — Progress Notes (Addendum)
Pt arrived by [facility] without any family present.  I was able to finally get in touch with [name] [name] who is pt's granddaughter.  She states pt lives with her and her grandmother and her brother and they will be home when pt gets home and will be taking care of him.  She states she has a niece who was taught about the pleurex catheter.  She states [name] from hospice [phone] is coming tomorrow am at 1100 to do teaching and check the catheter and site and pt. [name] Hire also states that she is taking care of her grandmother who is also very ill. She states that her brother was suppose to come to the hospital with the pt today but he "wouldn't get up"  Pt's granddaughter verbalized desire to be here and wishes she could be with her grandpa right now but her grandmother needed her. All above information passed to radiology nurse [name] and [name] radiology.

## 2016-12-31 NOTE — Progress Notes (Signed)
Spoke at length to [facility] in interventional radiology. Discharge plan firmed up and as follows... 8 pleurex drainage bottles ordered and in room to be sent home with pt Juliette Alcide spoke with [name](hospice) who confirmed follow up appointment at 1100 am tomorrow.   Pleurex information packet sent home with pt Juliette Alcide states she faxed equipment orders and sent invoices to be sent home with pt Pt to receive Albumin before discharge All above reported to pt's RN Alfonso Ramus.  She has all above supplies and instructions ready to go home with pt.

## 2016-12-31 NOTE — Sedation Documentation (Signed)
250 cc bolus of ns

## 2016-12-31 NOTE — Telephone Encounter (Signed)
Recall for hepatic ultrasound  °

## 2016-12-31 NOTE — Progress Notes (Signed)
Pharmacy Antibiotic Note  [name] [name] is a 66 y.o. male admitted on 12/31/2016 to start on antibiotics for surgical prophylaxis s/p pleurx catheter placement, paracentesis.  Pharmacy has been consulted for cefazolin dosing. Likely home with Hospice tomorrow per notes. SCr pending.  Plan: Cefazolin 2g IV x 1 dose Monitor clinical progress, c/s, renal function, LOT F/u SCr for maintenance dosing   Height:  (175.3 cm) Weight: 170 lb (77.1 kg) IBW/kg (Calculated) : 70.7  Temp (24hrs), Avg:97.5 F (36.4 C), Min:97.5 F (36.4 C), Max:97.5 F (36.4 C)   Recent Labs Lab 12/31/16 1336  WBC 5.3    Estimated Creatinine Clearance: 18.1 mL/min (A) (by C-G formula based on SCr of 3.95 mg/dL (H)).    No Known Allergies  Babs Bertin, PharmD, BCPS Clinical Pharmacist 12/31/2016 5:20 PM

## 2017-01-01 ENCOUNTER — Telehealth: Payer: Self-pay | Admitting: *Deleted

## 2017-01-01 ENCOUNTER — Other Ambulatory Visit: Payer: Self-pay

## 2017-01-01 DIAGNOSIS — F419 Anxiety disorder, unspecified: Secondary | ICD-10-CM

## 2017-01-01 DIAGNOSIS — M5136 Other intervertebral disc degeneration, lumbar region: Secondary | ICD-10-CM

## 2017-01-01 MED ORDER — OXYCODONE HCL 10 MG PO TABS: 10.0000 mg | ORAL_TABLET | Freq: Four times a day (QID) | ORAL | 0 refills | Status: DC

## 2017-01-01 MED ORDER — ALPRAZOLAM 1 MG PO TABS: ORAL_TABLET | ORAL | 0 refills | Status: DC
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
## 2017-01-01 NOTE — Telephone Encounter (Signed)
Per Dr. Louanne Skye I called in Alprazolam and Oxycodone to [facility].  Pharmacy will notify Hospice that medication ready for pick up.

## 2017-01-01 NOTE — Telephone Encounter (Signed)
Go ahead and print 1 weeks refills, forward to Dr Ermalinda Memos

## 2017-01-02 ENCOUNTER — Emergency Department (HOSPITAL_COMMUNITY): Payer: Medicare Other

## 2017-01-02 ENCOUNTER — Encounter (HOSPITAL_COMMUNITY): Payer: Self-pay | Admitting: *Deleted

## 2017-01-02 ENCOUNTER — Observation Stay (HOSPITAL_COMMUNITY)
Admission: EM | Admit: 2017-01-02 | Discharge: 2017-01-04 | Disposition: A | Discharge status: Home / Self Care | Payer: Medicare Other | Attending: Internal Medicine | Admitting: Internal Medicine

## 2017-01-02 DIAGNOSIS — J449 Chronic obstructive pulmonary disease, unspecified: Secondary | ICD-10-CM | POA: Diagnosis not present

## 2017-01-02 DIAGNOSIS — E1122 Type 2 diabetes mellitus with diabetic chronic kidney disease: Secondary | ICD-10-CM | POA: Insufficient documentation

## 2017-01-02 DIAGNOSIS — K7682 Hepatic encephalopathy: Secondary | ICD-10-CM

## 2017-01-02 DIAGNOSIS — N184 Chronic kidney disease, stage 4 (severe): Secondary | ICD-10-CM | POA: Insufficient documentation

## 2017-01-02 DIAGNOSIS — D696 Thrombocytopenia, unspecified: Secondary | ICD-10-CM | POA: Diagnosis not present

## 2017-01-02 DIAGNOSIS — N185 Chronic kidney disease, stage 5: Secondary | ICD-10-CM | POA: Diagnosis present

## 2017-01-02 DIAGNOSIS — K7460 Unspecified cirrhosis of liver: Secondary | ICD-10-CM | POA: Diagnosis present

## 2017-01-02 DIAGNOSIS — R4182 Altered mental status, unspecified: Secondary | ICD-10-CM | POA: Diagnosis not present

## 2017-01-02 DIAGNOSIS — E871 Hypo-osmolality and hyponatremia: Secondary | ICD-10-CM

## 2017-01-02 DIAGNOSIS — K7290 Hepatic failure, unspecified without coma: Secondary | ICD-10-CM | POA: Diagnosis not present

## 2017-01-02 DIAGNOSIS — E039 Hypothyroidism, unspecified: Secondary | ICD-10-CM | POA: Diagnosis present

## 2017-01-02 DIAGNOSIS — I129 Hypertensive chronic kidney disease with stage 1 through stage 4 chronic kidney disease, or unspecified chronic kidney disease: Secondary | ICD-10-CM | POA: Diagnosis not present

## 2017-01-02 DIAGNOSIS — E7220 Disorder of urea cycle metabolism, unspecified: Secondary | ICD-10-CM | POA: Diagnosis not present

## 2017-01-02 DIAGNOSIS — Z515 Encounter for palliative care: Secondary | ICD-10-CM | POA: Diagnosis not present

## 2017-01-02 DIAGNOSIS — D649 Anemia, unspecified: Secondary | ICD-10-CM

## 2017-01-02 DIAGNOSIS — N189 Chronic kidney disease, unspecified: Secondary | ICD-10-CM

## 2017-01-02 DIAGNOSIS — Z87891 Personal history of nicotine dependence: Secondary | ICD-10-CM | POA: Insufficient documentation

## 2017-01-02 DIAGNOSIS — D631 Anemia in chronic kidney disease: Secondary | ICD-10-CM | POA: Diagnosis not present

## 2017-01-02 DIAGNOSIS — R188 Other ascites: Secondary | ICD-10-CM

## 2017-01-02 HISTORY — DX: Do not resuscitate: Z66

## 2017-01-02 LAB — URINALYSIS, ROUTINE W REFLEX MICROSCOPIC
Bacteria, UA: NONE SEEN
Bilirubin Urine: NEGATIVE
GLUCOSE, UA: NEGATIVE mg/dL
Ketones, ur: NEGATIVE mg/dL
Leukocytes, UA: NEGATIVE
Nitrite: NEGATIVE
PROTEIN: NEGATIVE mg/dL
Specific Gravity, Urine: 1.015 (ref 1.005–1.030)
pH: 5 (ref 5.0–8.0)

## 2017-01-02 LAB — PROTIME-INR
INR: 1.72
Prothrombin Time: 20.3 seconds — ABNORMAL HIGH (ref 11.4–15.2)

## 2017-01-02 LAB — CBC WITH DIFFERENTIAL/PLATELET
BASOS ABS: 0 10*3/uL (ref 0.0–0.1)
BASOS PCT: 0 %
EOS ABS: 0 10*3/uL (ref 0.0–0.7)
Eosinophils Relative: 0 %
HCT: 23.3 % — ABNORMAL LOW (ref 39.0–52.0)
Hemoglobin: 8.5 g/dL — ABNORMAL LOW (ref 13.0–17.0)
LYMPHS ABS: 1.3 10*3/uL (ref 0.7–4.0)
LYMPHS PCT: 13 %
MCH: 32.1 pg (ref 26.0–34.0)
MCHC: 36.5 g/dL — ABNORMAL HIGH (ref 30.0–36.0)
MCV: 87.9 fL (ref 78.0–100.0)
Monocytes Absolute: 1.5 10*3/uL — ABNORMAL HIGH (ref 0.1–1.0)
Monocytes Relative: 15 %
NEUTROS PCT: 72 %
Neutro Abs: 7.2 10*3/uL (ref 1.7–7.7)
PLATELETS: 73 10*3/uL — AB (ref 150–400)
RBC: 2.65 MIL/uL — AB (ref 4.22–5.81)
RDW: 16.5 % — ABNORMAL HIGH (ref 11.5–15.5)
WBC: 10 10*3/uL (ref 4.0–10.5)

## 2017-01-02 LAB — COMPREHENSIVE METABOLIC PANEL
ALBUMIN: 2.6 g/dL — AB (ref 3.5–5.0)
ALT: 20 U/L (ref 17–63)
ANION GAP: 10 (ref 5–15)
AST: 33 U/L (ref 15–41)
Alkaline Phosphatase: 84 U/L (ref 38–126)
BUN: 66 mg/dL — ABNORMAL HIGH (ref 6–20)
CO2: 16 mmol/L — AB (ref 22–32)
Calcium: 8.5 mg/dL — ABNORMAL LOW (ref 8.9–10.3)
Chloride: 101 mmol/L (ref 101–111)
Creatinine, Ser: 3.7 mg/dL — ABNORMAL HIGH (ref 0.61–1.24)
GFR calc Af Amer: 18 mL/min — ABNORMAL LOW (ref >60)
GFR calc non Af Amer: 16 mL/min — ABNORMAL LOW (ref >60)
GLUCOSE: 121 mg/dL — AB (ref 65–99)
POTASSIUM: 5.4 mmol/L — AB (ref 3.5–5.1)
SODIUM: 127 mmol/L — AB (ref 135–145)
Total Bilirubin: 2.9 mg/dL — ABNORMAL HIGH (ref 0.3–1.2)
Total Protein: 5.2 g/dL — ABNORMAL LOW (ref 6.5–8.1)

## 2017-01-02 LAB — AMMONIA: AMMONIA: 182 umol/L — AB (ref 9–35)

## 2017-01-02 LAB — ACETAMINOPHEN LEVEL: Acetaminophen (Tylenol), Serum: <10 ug/mL — ABNORMAL LOW (ref 10–30)

## 2017-01-02 LAB — TROPONIN I

## 2017-01-02 LAB — SALICYLATE LEVEL: Salicylate Lvl: <7 mg/dL (ref 2.8–30.0)

## 2017-01-02 LAB — ETHANOL: Alcohol, Ethyl (B): <5 mg/dL (ref <5)

## 2017-01-02 NOTE — ED Triage Notes (Signed)
Pt arrived to er by [facility] after being found in bed confused and covered in dried stool by ems, pt will state his name but will continue to state his name when asked other questions, family reports that pt is a hospice pt, has been confused for unknown time frame,

## 2017-01-02 NOTE — ED Notes (Signed)
Pt cleaned of dried stool, pt continued to curse at staff and pull at gown, monitor leads, Dr Clarene Duke at bedside, warm blankets placed on pt,

## 2017-01-02 NOTE — ED Notes (Signed)
Family at bedside. 

## 2017-01-02 NOTE — ED Notes (Signed)
Pt has multiple abrasions, skin tears, bruising noted to bilateral arms in various stages of healing,
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
## 2017-01-03 ENCOUNTER — Encounter (HOSPITAL_COMMUNITY): Payer: Self-pay | Admitting: *Deleted

## 2017-01-03 DIAGNOSIS — E7220 Disorder of urea cycle metabolism, unspecified: Secondary | ICD-10-CM

## 2017-01-03 DIAGNOSIS — R4182 Altered mental status, unspecified: Secondary | ICD-10-CM | POA: Diagnosis not present

## 2017-01-03 DIAGNOSIS — D649 Anemia, unspecified: Secondary | ICD-10-CM

## 2017-01-03 DIAGNOSIS — N189 Chronic kidney disease, unspecified: Secondary | ICD-10-CM

## 2017-01-03 DIAGNOSIS — K7031 Alcoholic cirrhosis of liver with ascites: Secondary | ICD-10-CM | POA: Diagnosis not present

## 2017-01-03 DIAGNOSIS — Z515 Encounter for palliative care: Secondary | ICD-10-CM | POA: Diagnosis not present

## 2017-01-03 LAB — SAMPLE TO BLOOD BANK

## 2017-01-03 LAB — COMPREHENSIVE METABOLIC PANEL
ALBUMIN: 2.5 g/dL — AB (ref 3.5–5.0)
ALK PHOS: 83 U/L (ref 38–126)
ALT: 20 U/L (ref 17–63)
AST: 34 U/L (ref 15–41)
Anion gap: 8 (ref 5–15)
BUN: 69 mg/dL — ABNORMAL HIGH (ref 6–20)
CALCIUM: 8.3 mg/dL — AB (ref 8.9–10.3)
CHLORIDE: 104 mmol/L (ref 101–111)
CO2: 16 mmol/L — AB (ref 22–32)
Creatinine, Ser: 3.82 mg/dL — ABNORMAL HIGH (ref 0.61–1.24)
GFR calc Af Amer: 17 mL/min — ABNORMAL LOW (ref >60)
GFR calc non Af Amer: 15 mL/min — ABNORMAL LOW (ref >60)
GLUCOSE: 136 mg/dL — AB (ref 65–99)
Potassium: 5.7 mmol/L — ABNORMAL HIGH (ref 3.5–5.1)
Sodium: 128 mmol/L — ABNORMAL LOW (ref 135–145)
Total Bilirubin: 3.4 mg/dL — ABNORMAL HIGH (ref 0.3–1.2)
Total Protein: 5.1 g/dL — ABNORMAL LOW (ref 6.5–8.1)

## 2017-01-03 LAB — CBC
HEMATOCRIT: 23.3 % — AB (ref 39.0–52.0)
HEMOGLOBIN: 8.5 g/dL — AB (ref 13.0–17.0)
MCH: 32.1 pg (ref 26.0–34.0)
MCHC: 36.5 g/dL — ABNORMAL HIGH (ref 30.0–36.0)
MCV: 87.9 fL (ref 78.0–100.0)
PLATELETS: 75 10*3/uL — AB (ref 150–400)
RBC: 2.65 MIL/uL — AB (ref 4.22–5.81)
RDW: 16.6 % — ABNORMAL HIGH (ref 11.5–15.5)
WBC: 11.3 10*3/uL — ABNORMAL HIGH (ref 4.0–10.5)

## 2017-01-03 LAB — RAPID URINE DRUG SCREEN, HOSP PERFORMED
AMPHETAMINES: NOT DETECTED
Barbiturates: NOT DETECTED
Benzodiazepines: NOT DETECTED
Cocaine: NOT DETECTED
OPIATES: NOT DETECTED
Tetrahydrocannabinol: NOT DETECTED

## 2017-01-03 LAB — AMMONIA: Ammonia: 147 umol/L — ABNORMAL HIGH (ref 9–35)

## 2017-01-03 LAB — LACTIC ACID, PLASMA: LACTIC ACID, VENOUS: 3.3 mmol/L — AB (ref 0.5–1.9)

## 2017-01-03 MED ORDER — LACTULOSE ENEMA
300.0000 mL | Freq: Once | ORAL | Status: AC
  Administered 2017-01-03: 300 mL via RECTAL
  Filled 2017-01-03: qty 300

## 2017-01-03 MED ORDER — SODIUM CHLORIDE 0.9 % IV SOLN: INTRAVENOUS | Status: DC

## 2017-01-03 MED ORDER — ONDANSETRON HCL 4 MG PO TABS: 4.0000 mg | ORAL_TABLET | Freq: Four times a day (QID) | ORAL | Status: DC | PRN

## 2017-01-03 MED ORDER — LORAZEPAM 2 MG/ML IJ SOLN: 1.0000 mg | INTRAMUSCULAR | Status: DC | PRN

## 2017-01-03 MED ORDER — LEVOTHYROXINE SODIUM 100 MCG PO TABS
200.0000 ug | ORAL_TABLET | Freq: Every day | ORAL | Status: DC
  Filled 2017-01-03 (×2): qty 2

## 2017-01-03 MED ORDER — SODIUM CHLORIDE 0.9 % IV SOLN
INTRAVENOUS | Status: DC
  Administered 2017-01-03: 01:00:00 via INTRAVENOUS

## 2017-01-03 MED ORDER — ONDANSETRON HCL 4 MG/2ML IJ SOLN: 4.0000 mg | Freq: Four times a day (QID) | INTRAMUSCULAR | Status: DC | PRN

## 2017-01-03 MED ORDER — MORPHINE SULFATE (PF) 2 MG/ML IV SOLN: 2.0000 mg | INTRAVENOUS | Status: DC | PRN

## 2017-01-03 MED ORDER — SODIUM CHLORIDE 0.9 % IV SOLN
INTRAVENOUS | Status: DC
  Administered 2017-01-03: 03:00:00 via INTRAVENOUS

## 2017-01-03 NOTE — ED Notes (Signed)
CRITICAL VALUE ALERT  Critical value received:  Lactic Acid 3.3  Date of notification:  01/03/2017  Time of notification: 1245  Critical value read back: yes  Nurse who received alert:  Tenna Delaine, RN  MD notified (1st page):  Verbal  Time of first page:  1247  MD notified (2nd page):  Time of second page:  Responding MD:  Dr. Clarene Duke  Time MD responded:  1247
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
## 2017-01-03 NOTE — Progress Notes (Signed)
Patient admitted to the hospital earlier this morning by Dr. Sharl Ma  Patient seen and examined. He is lethargic, does not participate in exam  Patient is currently enrolled with home hospice. Has a history of chronic kidney disease, end-stage liver disease and has hepatic encephalopathy. He was recently discharged from the hospital with hospice services. There is concern regarding family's ability to care for patient at home. He be more appropriate for residential hospice. Less social work to reevaluate tomorrow. Palliative care consult.  [name] [name]

## 2017-01-04 DIAGNOSIS — Z7189 Other specified counseling: Secondary | ICD-10-CM | POA: Diagnosis not present

## 2017-01-04 DIAGNOSIS — K7290 Hepatic failure, unspecified without coma: Secondary | ICD-10-CM

## 2017-01-04 DIAGNOSIS — Z515 Encounter for palliative care: Secondary | ICD-10-CM

## 2017-01-04 DIAGNOSIS — R4182 Altered mental status, unspecified: Secondary | ICD-10-CM | POA: Diagnosis not present

## 2017-01-04 DIAGNOSIS — D696 Thrombocytopenia, unspecified: Secondary | ICD-10-CM | POA: Diagnosis not present

## 2017-01-04 DIAGNOSIS — K7031 Alcoholic cirrhosis of liver with ascites: Secondary | ICD-10-CM | POA: Diagnosis not present

## 2017-01-04 DIAGNOSIS — N189 Chronic kidney disease, unspecified: Secondary | ICD-10-CM | POA: Diagnosis not present

## 2017-01-04 MED ORDER — MORPHINE SULFATE (CONCENTRATE) 10 MG /0.5 ML PO SOLN: 10.0000 mg | ORAL | 0 refills | Status: AC | PRN

## 2017-01-04 MED ORDER — LORAZEPAM 1 MG PO TABS: 1.0000 mg | ORAL_TABLET | Freq: Four times a day (QID) | ORAL | 0 refills | Status: AC | PRN

## 2017-01-05 LAB — URINE CULTURE: Culture: NO GROWTH

## 2017-01-26 NOTE — Progress Notes (Addendum)
LCSW following for:  Disposition:  Hospice Home referral.  LCSW notified in progression, patient from home with [facility] [place]. Patient lives with his wife and per hospice RN, meets criteria for hospice home.  Referral called and faxed to [facility] with Caryn Bee reviewing information and working to reach family. Reports there is a bed available for patient today.  12:10 PM Patient has been accepted to hospice home of [facility] for admission today with bed available. Hospice home wanting transport after 2:30pm and family aware/completing paperwork for patient.  MD has been contacted for dc summary and orders. Patient will transport by EMS. RN to call report [phone]  Will follow up and continue to assist with DC planning.  Deretha Emory, MSW Clinical Social Work: Optician, dispensing Coverage for :  [phone]

## 2017-01-26 NOTE — Progress Notes (Signed)
Daily Progress Note   Patient Name: [name] [name]       Date: Jan 20, 2017 DOB: 1951/08/24  Age: 66 y.o. MRN#: [id number] Attending Physician: Erick Blinks, MD Primary Care Physician: [name], PA-C Admit Date: 01/02/2017  Reason for Consultation/Follow-up: Establishing goals of care, Inpatient hospice referral and Psychosocial/spiritual support  Subjective: [name] [name] is lying quietly in bed. He does not make or keep eye contact. No family at bedside at this time. [name] [name] is unable, at this point to express even his most basic needs. Call to wife at home and cell phone number multiple times, no answer, unable to leave voicemail. Call to grand daughter and grandson [name] [name] and [name] [name], also unable to reach, unable to leave voicemail. Call to hospice home of [facility], spoke with Passenger transport manager. [name] [name] is active with their in-home service, nursing staff came to the hospital earlier today to evaluate [name] and state they are willing to accept him at hospice home. Hospice will continue to reach out to [name] [name]'s family for okay to admit to hospice home. Hospice home excepting transfer of [name] [name] around 230 today.  Length of Stay: 0  Current Medications: Scheduled Meds:  . levothyroxine  200 mcg Oral QAC breakfast    Continuous Infusions: . sodium chloride      PRN Meds: LORazepam, morphine injection, ondansetron **OR** ondansetron (ZOFRAN) IV  Physical Exam  Constitutional: No distress.  Appears acutely ill, jaundice, not able to communicate basic needs  HENT:  Head: Normocephalic and atraumatic.  Cardiovascular: Normal rate.   Pulmonary/Chest: Effort normal. No respiratory distress.  Abdominal: Soft. He exhibits distension.  Mildly distended stomach, soft, PleurX  drain tube in place covered with dressing  Musculoskeletal: He exhibits no edema.  Muscle wasting  Neurological:  Altered mental status, unable to communicate basic needs, does not make eye contact  Skin: Skin is warm and dry.  Jaundiced  Nursing note and vitals reviewed.           Vital Signs: BP (!) 98/58 (BP Location: Right Arm)   Pulse 75   Temp 97.4 F (36.3 C) (Axillary)   Resp 18   Wt 77.1 kg (170 lb)   SpO2 100%   BMI 25.10 kg/m  SpO2: SpO2: 100 % O2 Device: O2 Device: Not Delivered O2 Flow  Rate:    Intake/output summary:  Intake/Output Summary (Last 24 hours) at 2017/01/11 1210 Last data filed at 01-11-17 4782  Gross per 24 hour  Intake                0 ml  Output              350 ml  Net             -350 ml   LBM: Last BM Date: 01/03/17 Baseline Weight: Weight: 77.1 kg (170 lb) Most recent weight: Weight: 77.1 kg (170 lb)       Palliative Assessment/Data:      Patient Active Problem List   Diagnosis Date Noted  . Altered mental status 01/03/2017  . Protein-calorie malnutrition, severe 12/16/2016  . Palliative care encounter   . Goals of care, counseling/discussion   . DNR (do not resuscitate) discussion   . Encounter for hospice care discussion   . AKI (acute kidney injury) (HCC) 12/15/2016  . CKD (chronic kidney disease), stage IV (HCC) 12/15/2016  . DDD (degenerative disc disease), lumbar 11/24/2016  . Alcoholic cirrhosis of liver with ascites (HCC)   . Hepatic encephalopathy (HCC) 05/05/2016  . Bleeding esophageal varices (HCC)   . C. difficile colitis   . Hypokalemia 01/07/2016  . Chronic renal failure, stage 5 (HCC) 01/07/2016  . Vomiting and diarrhea 10/24/2015  . Osteomyelitis (HCC) 10/08/2015  . Diabetic foot infection (HCC) 10/08/2015  . Secondary esophageal varices without bleeding (HCC)   . Cirrhosis of liver with ascites (HCC) 08/15/2015  . Thrombocytopenia (HCC) 05/15/2012  . Hypothyroidism 05/15/2012    Palliative Care  Assessment & Plan   Patient Profile: [name] [name]  is a 66 y.o. male, With history of cirrhosis, portal hypertension,  hyponatremia, diabetes mellitus, CKD stage IV who was recently discharged on 12/17/2016 with home hospice. Per EMS, they were called to home for elderly male needing assistance with oxygen tank and nebulizer machine. EMS noted patient laying in bed with dried  stool and confused. Patient's family says that patient has been confused for unknown period of time. He is currently enrolled in hospice and he is DO NOT RESUSCITATE.  Assessment: EOL: transfer to Hospice home today.   Recommendations/Plan:  Hospice home of [facility] today.   Goals of Care and Additional Recommendations:  Limitations on Scope of Treatment: Full Comfort Care  Code Status:    Code Status Orders        Start     Ordered   01/03/17 0153  Do not attempt resuscitation (DNR)  Continuous    Question Answer Comment  In the event of cardiac or respiratory ARREST Do not call a "code blue"   In the event of cardiac or respiratory ARREST Do not perform Intubation, CPR, defibrillation or ACLS   In the event of cardiac or respiratory ARREST Use medication by any route, position, wound care, and other measures to relive pain and suffering. May use oxygen, suction and manual treatment of airway obstruction as needed for comfort.      01/03/17 0152    Code Status History    Date Active Date Inactive Code Status Order ID Comments User Context   12/15/2016  6:24 PM 12/17/2016  9:07 PM DNR [id number]  Standley Brooking, MD Inpatient   12/15/2016  5:52 PM 12/15/2016  6:24 PM Full Code [id number]  Standley Brooking, MD Inpatient   05/06/2016  4:01 AM 05/07/2016  5:42 PM DNR [id number]  [name]  Norton Pastel, RN Inpatient   03/30/2016 12:03 AM 04/04/2016  6:47 PM Partial Code [id number]  [name], MD Inpatient   01/07/2016  7:32 PM 01/11/2016  2:47 PM Full Code [id number]  [name], MD Inpatient   10/14/2015 12:37  PM 10/15/2015  7:21 PM Full Code [id number]  [name], MD Inpatient   10/08/2015  6:29 PM 10/14/2015 12:37 PM Full Code [id number]  [name], MD Inpatient   08/15/2015  6:28 PM 08/19/2015  4:57 PM Full Code [id number]  [name] Singer, MD ED   05/15/2012  7:32 PM 05/20/2012  9:14 PM Full Code [id number]  [name] Foley, RN Inpatient    Advance Directive Documentation     Most Recent Value  Type of Advance Directive  Out of facility DNR (pink MOST or yellow form)  Pre-existing out of facility DNR order (yellow form or pink MOST form)  Yellow form placed in chart (order not valid for inpatient use)  "MOST" Form in Place?  -       Prognosis:   < 2 weeks  Discharge Planning:  Hospice home of [facility] plan was discussed with nursing staff, CM, SW, and Dr. Kerry Hough.   Thank you for allowing the Palliative Medicine Team to assist in the care of this patient.   Time In: 0935 Time Out: 1015 Total Time 40 minutes  Prolonged Time Billed  no       Greater than 50%  of this time was spent counseling and coordinating care related to the above assessment and plan.  Katheran Awe, NP  Please contact Palliative Medicine Team phone at [phone] for questions and concerns.

## 2017-01-26 NOTE — Care Management Note (Signed)
Case Management Note  Patient Details  Name: [name] [name] MRN: [id number] Date of Birth: 07-07-1951  Subjective/Objective:                  Family unable to provide care for pt at home. He is active with [facility] for home care services. Family interested in facility placement. Hospice facility has bed and will accept pt today. CSW making arrangements for placement.   Action/Plan: Discharging today. No CM needs.   Expected Discharge Date:  01-20-17               Expected Discharge Plan:  Home w Hospice Care  In-House Referral:  Clinical Social Work, Hospice / Palliative Care  Discharge planning Services  CM Consult  Post Acute Care Choice:  NA Choice offered to:  NA  Status of Service:  Completed, signed off  Malcolm Metro, RN 01/20/2017, 1:08 PM

## 2017-01-26 NOTE — Discharge Summary (Signed)
Physician Discharge Summary  [name] [name]:[id number] DOB: May 14, 1951 DOA: 01/02/2017  PCP: [name], PA-C  Admit date: 01/02/2017 Discharge date: 08-Jan-2017  Admitted From: home Disposition: residential hospice  Recommendations for Outpatient Follow-up:  1. Patient will be discharged to residential hospice for end of life care  Discharge Condition: stable CODE STATUS: DNR, comfort Diet recommendation: regular diet for comfort  Brief/Interim Summary: 66 year old male with a history of chronic kidney disease stage 4, end-stage liver disease, hepatic encephalopathy who was recently discharged on 3/22 with plans for home with hospice. EMS was called patient's home and it was noted that patient was laying in bed with dried stool and was confused. He was brought to the ER for evaluation where he was noted to be confused/lethargic. Ammonia was significant elevated at 182. Due to concerns regarding safety for discharge home he was admitted for further management. He was seen by palliative care and social work who arranged for patient to go to residential hospice. Family was in agreement with this. All medications that are not related to his comfort will be discontinued. Bed is available at residential hospice today and therefore he'll be discharged. Family was contacted and is in agreement.  Discharge Diagnoses:  Active Problems:   Thrombocytopenia (HCC)   Hypothyroidism   Cirrhosis of liver with ascites (HCC)   Chronic renal failure, stage 5 (HCC)   Hepatic encephalopathy (HCC)   Altered mental status    Discharge Instructions  Discharge Instructions    Diet - low sodium heart healthy    Complete by:  As directed    Increase activity slowly    Complete by:  As directed      Allergies as of 2017/01/08   No Known Allergies     Medication List    STOP taking these medications   albuterol 108 (90 Base) MCG/ACT inhaler Commonly known as:  PROVENTIL HFA;VENTOLIN HFA   ALPRAZolam  1 MG tablet Commonly known as:  XANAX   feeding supplement (ENSURE ENLIVE) Liqd   levothyroxine 200 MCG tablet Commonly known as:  SYNTHROID, LEVOTHROID   MAGNESIUM-OXIDE 400 (241.3 Mg) MG tablet Generic drug:  magnesium oxide   nadolol 40 MG tablet Commonly known as:  CORGARD   omeprazole 20 MG capsule Commonly known as:  PRILOSEC   Oxycodone HCl 10 MG Tabs   rOPINIRole 0.5 MG tablet Commonly known as:  REQUIP   tamsulosin 0.4 MG Caps capsule Commonly known as:  FLOMAX   VITAMIN D PO     TAKE these medications   LORazepam 1 MG tablet Commonly known as:  ATIVAN Place 1 tablet (1 mg total) under the tongue every 6 (six) hours as needed for anxiety.   morphine CONCENTRATE 10 mg / 0.5 ml concentrated solution Place 0.5 mLs (10 mg total) under the tongue every 2 (two) hours as needed for severe pain.       No Known Allergies  Consultations:  Palliative care   Procedures/Studies: Ct Abdomen Pelvis Wo Contrast  Result Date: 01/03/2017 CLINICAL DATA:  Cirrhosis, altered mental status EXAM: CT ABDOMEN AND PELVIS WITHOUT CONTRAST TECHNIQUE: Multidetector CT imaging of the abdomen and pelvis was performed following the standard protocol without IV contrast. COMPARISON:  03/25/2015 CT FINDINGS: Lower chest: Mild aneurysmal dilatation of the ascending aorta with aortic atherosclerosis and coronary arteriosclerosis. Borderline cardiomegaly without pericardial effusion. Subpleural areas of fibrosis at each lung base. Hepatobiliary: Small shrunken cirrhotic appearing liver. Small granuloma in the right hepatic lobe. Cholelithiasis within the visualized  gallbladder. No definite space-occupying mass of the liver no biliary dilatation. Pancreas: No mass or ductal dilatation. Spleen: Splenic granulomas.  No splenomegaly. Adrenals/Urinary Tract: No adrenal mass. No obstructive uropathy or renal mass. Urinary bladder is physiologically distended. Stomach/Bowel: Centralized bowel loops  secondary to large volume of ascites within the abdomen and pelvis. No bowel obstruction. No definite bowel inflammation. A small amount liquid stool in the rectal vault is seen. Vascular/Lymphatic: Aortoiliac atherosclerosis without aneurysm. No lymphadenopathy. Reproductive: Peripheral zone calcifications of the normal sized prostate. Other: Large volume of abdominopelvic ascites. Musculoskeletal: Degenerative change along the dorsal spine with disc space narrowing most prominently at L4-5 and L5-S1 of the lumbar spine with small posterior marginal osteophytes. T5 through T10 disc space narrowing also noted of the visualized thoracic spine. IMPRESSION: 1. Small shrunken cirrhotic appearing liver without space-occupying mass. 2. Uncomplicated cholelithiasis. 3. No acute bowel pathology. Small amount of liquid stool the rectal vault may represent diarrheal disease. 4. Splenic granulomata as before. 5. Aortoiliac atherosclerosis. 6. Bilateral subpleural fibrosis at the lung bases. Electronically Signed   By: Tollie Eth M.D.   On: 01/03/2017 00:13   Dg Chest 1 View  Result Date: 01/03/2017 CLINICAL DATA:  Altered mental status, cirrhosis EXAM: CHEST 1 VIEW COMPARISON:  None. FINDINGS: Low lung volumes with top normal size cardiac silhouette. Mild dextroconvex curvature of the mid trachea may be due to tortuous ectatic aortic arch. Aortic atherosclerosis of the arch. Streaky parenchymal changes are noted bilaterally which may reflect chronic bronchitic change and/or fibrosis. No alveolar consolidation no effusion or pneumothorax. IMPRESSION: Low lung volumes streaky parenchymal opacities which may represent areas of interstitial fibrosis or chronic bronchitic change. Atherosclerosis of the aortic arch. Electronically Signed   By: Tollie Eth M.D.   On: 01/03/2017 00:17   Ct Head Wo Contrast  Result Date: 01/03/2017 CLINICAL DATA:  Confusion, covered in dry stool. EXAM: CT HEAD WITHOUT CONTRAST TECHNIQUE:  Contiguous axial images were obtained from the base of the skull through the vertex without intravenous contrast. COMPARISON:  05/05/2016 FINDINGS: Brain: Left temporal lobe but not arachnoid cyst is unchanged in appearance. Mild superficial and central atrophy. Chronic small vessel ischemic disease of periventricular white matter. No acute large vascular territory infarction, hemorrhage nor midline shift. No intra-axial mass nor extra-axial fluid collections. Brainstem and fourth ventricle are unremarkable. No acute abnormality in the basal ganglia. Cerebellar hemispheres mildly atrophic. Vascular: Atherosclerosis of the cavernous sinus carotids and both vertebral arteries. No hyperdense vessels. Skull: Normal. Negative for fracture or focal lesion. Sinuses/Orbits: No acute finding. Other: None. IMPRESSION: No acute intracranial abnormality. Chronic mild superficial and central atrophy with small vessel ischemic disease of periventricular white matter. Electronically Signed   By: Tollie Eth M.D.   On: 01/03/2017 00:06   US Paracentesis  Result Date: 12/17/2016 INDICATION: Alcoholic cirrhosis, ascites EXAM: ULTRASOUND GUIDED THERAPEUTIC PARACENTESIS MEDICATIONS: None. COMPLICATIONS: None immediate. PROCEDURE: Procedure, benefits, and risks of procedure were discussed with patient. Written informed consent for procedure was obtained. Time out protocol followed. Adequate collection of ascites localized by ultrasound in RIGHT lower quadrant. Skin prepped and draped in usual sterile fashion. Skin and soft tissues anesthetized with 10 mL of 1% lidocaine. 5 [place] Yueh catheter placed into peritoneal cavity. 7 L of yellow ascitic fluid aspirated by vacuum bottle suction. Procedure tolerated well by patient without immediate complication. FINDINGS: As above IMPRESSION: Successful ultrasound-guided paracentesis yielding 7 liters of peritoneal fluid. Electronically Signed   By: Ulyses Southward M.D.   On:  12/17/2016 15:16    US Paracentesis  Result Date: 12/08/2016 INDICATION: Cirrhosis, ascites EXAM: ULTRASOUND GUIDED DIAGNOSTIC AND THERAPEUTIC PARACENTESIS MEDICATIONS: None. COMPLICATIONS: None immediate. PROCEDURE: Procedure, benefits, and risks of procedure were discussed with patient. Written informed consent for procedure was obtained. Time out protocol followed. Adequate collection of ascites localized by ultrasound in RIGHT lower quadrant. Skin prepped and draped in usual sterile fashion. Skin and soft tissues anesthetized with 10 mL of 1% lidocaine. 5 [place] Yueh catheter placed into peritoneal cavity. 10.2 L of yellow ascitic fluid aspirated by vacuum bottle suction. Procedure tolerated well by patient without immediate complication. FINDINGS: A total of approximately 10.2 L of ascitic fluid was removed. Samples were sent to the laboratory as requested by the clinical team. IMPRESSION: Successful ultrasound-guided paracentesis yielding 10.2 liters of peritoneal fluid. Electronically Signed   By: Ulyses Southward M.D.   On: 12/08/2016 13:36   Ir Perc Noralee Stain Perit Cath Wo Port  Result Date: 12/31/2016 CLINICAL DATA:  Cirrhosis with recurrent large volume symptomatic ascites recurrent requiring repeated paracentesis. EXAM: TUNNELED PERITONEAL CUFFED DRAIN CATHETER PLACEMENT UNDER ULTRASOUND AND FLUOROSCOPIC GUIDANCE FLUOROSCOPY TIME:  0.1 minute (21 uGym2 DAP) TECHNIQUE: The procedure, risks (including but not limited to bleeding, infection, organ damage ), benefits, and alternatives were explained to the patient. Questions regarding the procedure were encouraged and answered. The patient understands and consents to the procedure. Survey ultrasound of the abdomen was performed and an appropriate skin entry site was selected. As antibiotic prophylaxis, cefazolin was ordered intravenously. Skin site was marked, prepped with Betadine, and draped in usual sterile fashion, and infiltrated locally with 1% lidocaine. Intravenous  Fentanyl administered as conscious sedation during continuous monitoring of the patient's level of consciousness and physiological / cardiorespiratory status by the radiology RN, with a total moderate sedation time of 16 minutes. Under real-time ultrasound guidance, a 18 gauge sheath needle was advanced into the peritoneal space. Ultrasound imaging documentation was saved.Ascites could be aspirated. An Amplatz guidewire advanced through the sheath easily. The PleurX cuffed drain catheter was tunneled from an appropriate skin entry site to the dermatotomy. The sheath was exchanged over the wire for a peel-away sheath, through which the PleurX catheter was advanced into the peritoneal space. Fluoroscopy confirmed appropriate position of the catheter. The catheter was secured externally with 0 Prolene suture. The dermatotomy was closed with Dermabond. Total of 3 liters clear yellow ascites were removed. The patient tolerated the procedure well, with no immediate complication. IMPRESSION: 1. Technically successful tunneled cuffed peritoneal drain catheter placement. 2. Paracentesis removing 3 liters ascites. Electronically Signed   By: Corlis Leak M.D.   On: 12/31/2016 17:12   Dg Chest Portable 1 View  Result Date: 12/15/2016 CLINICAL DATA:  Shortness of Breath EXAM: PORTABLE CHEST 1 VIEW COMPARISON:  05/05/2016 FINDINGS: Cardiac shadow is stable. Increased perihilar infiltrates are noted bilaterally worsened from the prior exam. No sizable effusion is seen. No acute bony abnormality is noted. IMPRESSION: Increasing perihilar infiltrates bilaterally. Electronically Signed   By: Alcide Clever M.D.   On: 12/15/2016 11:42       Subjective: Patient is lethargic and does not answer questions  Discharge Exam: Vitals:   01/03/17 2007 Jan 19, 2017 0638  BP: 107/76 (!) 98/58  Pulse: 76 75  Resp: 18 18  Temp: 97.6 F (36.4 C) 97.4 F (36.3 C)   Vitals:   01/03/17 0546 01/03/17 1300 01/03/17 2007 Jan 19, 2017 0638   BP: 91/60 94/62 107/76 (!) 98/58  Pulse: 80 74 76 75  Resp: Temp: 97.5 F (36.4 C) 97.8 F (36.6 C) 97.6 F (36.4 C) 97.4 F (36.3 C)  TempSrc: Axillary Axillary Axillary Axillary  SpO2: 98% 98% 100% 100%  Weight:        General: Pt is lethargic Cardiovascular: RRR, S1/S2 +, no rubs, no gallops Respiratory: CTA bilaterally, no wheezing, no rhonchi Abdominal: Soft, NT, ND, bowel sounds + Extremities: no edema, no cyanosis    The results of significant diagnostics from this hospitalization (including imaging, microbiology, ancillary and laboratory) are listed below for reference.     Microbiology: No results found for this or any previous visit (from the past 240 hour(s)).   Labs: BNP (last 3 results)  Recent Labs  01/07/16 1453  BNP 94.0   Basic Metabolic Panel:  Recent Labs Lab 01/02/17 2311 01/03/17 0612  NA 127* 128*  K 5.4* 5.7*  CL 101 104  CO2 16* 16*  GLUCOSE 121* 136*  BUN 66* 69*  CREATININE 3.70* 3.82*  CALCIUM 8.5* 8.3*   Liver Function Tests:  Recent Labs Lab 01/02/17 2311 01/03/17 0612  AST 33 34  ALT 20 20  ALKPHOS 84 83  BILITOT 2.9* 3.4*  PROT 5.2* 5.1*  ALBUMIN 2.6* 2.5*   No results for input(s): LIPASE, AMYLASE in the last 168 hours.  Recent Labs Lab 01/02/17 2307 01/03/17 0612  AMMONIA 182* 147*   CBC:  Recent Labs Lab 12/31/16 1336 01/02/17 2311 01/03/17 0612  WBC 5.3 10.0 11.3*  NEUTROABS  --  7.2  --   HGB 7.7* 8.5* 8.5*  HCT 21.9* 23.3* 23.3*  MCV 86.9 87.9 87.9  PLT 74* 73* 75*   Cardiac Enzymes:  Recent Labs Lab 01/02/17 2311  TROPONINI <0.03   BNP: Invalid input(s): POCBNP CBG:  Recent Labs Lab 12/31/16 1252 12/31/16 1624  GLUCAP 110* 129*   D-Dimer No results for input(s): DDIMER in the last 72 hours. Hgb A1c No results for input(s): HGBA1C in the last 72 hours. Lipid Profile No results for input(s): CHOL, HDL, LDLCALC, TRIG, CHOLHDL, LDLDIRECT in the last 72  hours. Thyroid function studies No results for input(s): TSH, T4TOTAL, T3FREE, THYROIDAB in the last 72 hours.  Invalid input(s): FREET3 Anemia work up No results for input(s): VITAMINB12, FOLATE, FERRITIN, TIBC, IRON, RETICCTPCT in the last 72 hours. Urinalysis    Component Value Date/Time   COLORURINE YELLOW 01/02/2017 2307   APPEARANCEUR CLEAR 01/02/2017 2307   LABSPEC 1.015 01/02/2017 2307   PHURINE 5.0 01/02/2017 2307   GLUCOSEU NEGATIVE 01/02/2017 2307   HGBUR SMALL (A) 01/02/2017 2307   BILIRUBINUR NEGATIVE 01/02/2017 2307   KETONESUR NEGATIVE 01/02/2017 2307   PROTEINUR NEGATIVE 01/02/2017 2307   UROBILINOGEN 0.2 05/15/2012 1425   NITRITE NEGATIVE 01/02/2017 2307   LEUKOCYTESUR NEGATIVE 01/02/2017 2307   Sepsis Labs Invalid input(s): PROCALCITONIN,  WBC,  LACTICIDVEN Microbiology No results found for this or any previous visit (from the past 240 hour(s)).   Time coordinating discharge: Over 30 minutes  SIGNED:   Erick Blinks, MD  [facility] 01/23/17, 1:21 PM Pager   If 7PM-7AM, please contact night-coverage [email or web address] Password [email or web address]

## 2017-01-26 NOTE — Care Management Obs Status (Addendum)
MEDICARE OBSERVATION STATUS NOTIFICATION   Patient Details  Name: [name] [name] MRN: [id number] Date of Birth: 01/17/1951   Medicare Observation Status Notification Given:  Yes (telephone signature of spouse Trice Aspinall [phone])    Malcolm Metro, RN 2017/01/12, 9:26 AM

## 2017-01-26 NOTE — Progress Notes (Signed)
Discharged to [facility], stable condition, transported by [employer], reported to [employer].

## 2017-01-26 DEATH — deceased

## 2017-02-24 ENCOUNTER — Ambulatory Visit: Payer: Medicare Other | Admitting: Physician Assistant
